# Patient Record
Sex: Female | Born: 1935 | Race: White | Hispanic: No | Smoking: Never smoker
Health system: Southern US, Community
[De-identification: ages and names within clinical notes are randomized; demographics above are authoritative.]

## PROBLEM LIST (undated history)

## (undated) DIAGNOSIS — I10 Essential (primary) hypertension: Secondary | ICD-10-CM

## (undated) DIAGNOSIS — K5909 Other constipation: Secondary | ICD-10-CM

## (undated) DIAGNOSIS — R112 Nausea with vomiting, unspecified: Secondary | ICD-10-CM

## (undated) DIAGNOSIS — M1990 Unspecified osteoarthritis, unspecified site: Secondary | ICD-10-CM

## (undated) DIAGNOSIS — Z0000 Encounter for general adult medical examination without abnormal findings: Secondary | ICD-10-CM

## (undated) DIAGNOSIS — R609 Edema, unspecified: Secondary | ICD-10-CM

## (undated) DIAGNOSIS — F32A Depression, unspecified: Secondary | ICD-10-CM

## (undated) DIAGNOSIS — F323 Major depressive disorder, single episode, severe with psychotic features: Secondary | ICD-10-CM

## (undated) DIAGNOSIS — Z9889 Other specified postprocedural states: Secondary | ICD-10-CM

## (undated) DIAGNOSIS — K269 Duodenal ulcer, unspecified as acute or chronic, without hemorrhage or perforation: Secondary | ICD-10-CM

## (undated) DIAGNOSIS — M5430 Sciatica, unspecified side: Secondary | ICD-10-CM

## (undated) DIAGNOSIS — I4891 Unspecified atrial fibrillation: Secondary | ICD-10-CM

## (undated) DIAGNOSIS — E039 Hypothyroidism, unspecified: Secondary | ICD-10-CM

## (undated) DIAGNOSIS — K8020 Calculus of gallbladder without cholecystitis without obstruction: Secondary | ICD-10-CM

## (undated) DIAGNOSIS — K222 Esophageal obstruction: Secondary | ICD-10-CM

## (undated) DIAGNOSIS — K5732 Diverticulitis of large intestine without perforation or abscess without bleeding: Secondary | ICD-10-CM

## (undated) DIAGNOSIS — R1310 Dysphagia, unspecified: Secondary | ICD-10-CM

## (undated) DIAGNOSIS — I509 Heart failure, unspecified: Secondary | ICD-10-CM

## (undated) DIAGNOSIS — E538 Deficiency of other specified B group vitamins: Secondary | ICD-10-CM

## (undated) HISTORY — DX: Hypothyroidism, unspecified: E03.9

## (undated) HISTORY — DX: Duodenal ulcer, unspecified as acute or chronic, without hemorrhage or perforation: K26.9

## (undated) HISTORY — DX: Edema, unspecified: R60.9

## (undated) HISTORY — DX: Sciatica, unspecified side: M54.30

## (undated) HISTORY — DX: Heart failure, unspecified: I50.9

## (undated) HISTORY — DX: Encounter for general adult medical examination without abnormal findings: Z00.00

## (undated) HISTORY — DX: Diverticulitis of large intestine without perforation or abscess without bleeding: K57.32

## (undated) HISTORY — DX: Dysphagia, unspecified: R13.10

## (undated) HISTORY — PX: ABDOMINAL HYSTERECTOMY: SHX81

## (undated) HISTORY — DX: Unspecified osteoarthritis, unspecified site: M19.90

## (undated) HISTORY — DX: Other constipation: K59.09

## (undated) HISTORY — DX: Depression, unspecified: F32.A

## (undated) HISTORY — DX: Deficiency of other specified B group vitamins: E53.8

## (undated) HISTORY — DX: Esophageal obstruction: K22.2

## (undated) HISTORY — DX: Major depressive disorder, single episode, severe with psychotic features: F32.3

## (undated) HISTORY — PX: CHOLECYSTECTOMY: SHX55

## (undated) HISTORY — DX: Essential (primary) hypertension: I10

## (undated) HISTORY — DX: Calculus of gallbladder without cholecystitis without obstruction: K80.20

## (undated) HISTORY — DX: Unspecified atrial fibrillation: I48.91

---

## 1998-09-19 ENCOUNTER — Encounter: Payer: Self-pay | Admitting: Urology

## 1998-09-21 ENCOUNTER — Ambulatory Visit (HOSPITAL_COMMUNITY): Admission: RE | Admit: 1998-09-21 | Discharge: 1998-09-21 | Payer: Self-pay | Admitting: Urology

## 1999-08-30 ENCOUNTER — Ambulatory Visit (HOSPITAL_COMMUNITY): Admission: RE | Admit: 1999-08-30 | Discharge: 1999-08-30 | Payer: Self-pay | Admitting: Urology

## 2000-02-25 ENCOUNTER — Encounter: Payer: Self-pay | Admitting: Internal Medicine

## 2000-02-25 ENCOUNTER — Encounter: Admission: RE | Admit: 2000-02-25 | Discharge: 2000-02-25 | Payer: Self-pay | Admitting: Internal Medicine

## 2000-05-20 ENCOUNTER — Encounter: Payer: Self-pay | Admitting: Endocrinology

## 2000-05-20 ENCOUNTER — Ambulatory Visit (HOSPITAL_COMMUNITY): Admission: RE | Admit: 2000-05-20 | Discharge: 2000-05-20 | Payer: Self-pay | Admitting: Endocrinology

## 2001-02-17 ENCOUNTER — Encounter: Admission: RE | Admit: 2001-02-17 | Discharge: 2001-02-17 | Payer: Self-pay | Admitting: Internal Medicine

## 2001-02-17 ENCOUNTER — Encounter: Payer: Self-pay | Admitting: Internal Medicine

## 2002-04-14 ENCOUNTER — Encounter: Admission: RE | Admit: 2002-04-14 | Discharge: 2002-04-14 | Payer: Self-pay | Admitting: Internal Medicine

## 2002-04-14 ENCOUNTER — Encounter: Payer: Self-pay | Admitting: Internal Medicine

## 2002-06-25 ENCOUNTER — Ambulatory Visit (HOSPITAL_COMMUNITY): Admission: RE | Admit: 2002-06-25 | Discharge: 2002-06-25 | Payer: Self-pay | Admitting: Gastroenterology

## 2002-06-25 ENCOUNTER — Encounter: Payer: Self-pay | Admitting: Gastroenterology

## 2003-09-01 ENCOUNTER — Encounter: Admission: RE | Admit: 2003-09-01 | Discharge: 2003-09-01 | Payer: Self-pay | Admitting: Internal Medicine

## 2004-05-16 ENCOUNTER — Ambulatory Visit: Payer: Self-pay | Admitting: Internal Medicine

## 2004-06-27 ENCOUNTER — Ambulatory Visit: Payer: Self-pay | Admitting: Internal Medicine

## 2004-07-04 ENCOUNTER — Ambulatory Visit: Payer: Self-pay | Admitting: Gastroenterology

## 2004-07-05 ENCOUNTER — Ambulatory Visit (HOSPITAL_COMMUNITY): Admission: RE | Admit: 2004-07-05 | Discharge: 2004-07-05 | Payer: Self-pay | Admitting: Gastroenterology

## 2004-07-10 ENCOUNTER — Ambulatory Visit: Payer: Self-pay | Admitting: Gastroenterology

## 2005-01-02 ENCOUNTER — Encounter: Admission: RE | Admit: 2005-01-02 | Discharge: 2005-01-02 | Payer: Self-pay | Admitting: Internal Medicine

## 2005-02-25 ENCOUNTER — Ambulatory Visit: Payer: Self-pay | Admitting: Internal Medicine

## 2005-04-16 ENCOUNTER — Ambulatory Visit: Payer: Self-pay | Admitting: Internal Medicine

## 2005-10-31 ENCOUNTER — Ambulatory Visit: Payer: Self-pay | Admitting: Internal Medicine

## 2005-12-06 ENCOUNTER — Ambulatory Visit: Payer: Self-pay | Admitting: Internal Medicine

## 2006-01-28 ENCOUNTER — Encounter: Admission: RE | Admit: 2006-01-28 | Discharge: 2006-01-28 | Payer: Self-pay | Admitting: Internal Medicine

## 2006-02-24 ENCOUNTER — Ambulatory Visit: Payer: Self-pay | Admitting: Internal Medicine

## 2006-07-07 ENCOUNTER — Ambulatory Visit: Payer: Self-pay | Admitting: Internal Medicine

## 2006-10-14 ENCOUNTER — Ambulatory Visit: Payer: Self-pay | Admitting: Internal Medicine

## 2006-10-14 ENCOUNTER — Inpatient Hospital Stay (HOSPITAL_COMMUNITY): Admission: EM | Admit: 2006-10-14 | Discharge: 2006-10-15 | Payer: Self-pay | Admitting: Emergency Medicine

## 2006-10-24 ENCOUNTER — Ambulatory Visit: Payer: Self-pay | Admitting: Internal Medicine

## 2006-10-24 LAB — CONVERTED CEMR LAB
CO2: 29 meq/L (ref 19–32)
Chloride: 105 meq/L (ref 96–112)
Creatinine, Ser: 1.7 mg/dL — ABNORMAL HIGH (ref 0.4–1.2)
Potassium: 4.8 meq/L (ref 3.5–5.1)
Sodium: 140 meq/L (ref 135–145)
Total CK: 62 units/L (ref 7–177)

## 2007-02-14 ENCOUNTER — Encounter: Payer: Self-pay | Admitting: *Deleted

## 2007-02-14 DIAGNOSIS — I10 Essential (primary) hypertension: Secondary | ICD-10-CM

## 2007-03-09 ENCOUNTER — Ambulatory Visit: Payer: Self-pay | Admitting: Internal Medicine

## 2007-03-10 ENCOUNTER — Encounter: Admission: RE | Admit: 2007-03-10 | Discharge: 2007-03-10 | Payer: Self-pay | Admitting: Internal Medicine

## 2007-04-22 ENCOUNTER — Telehealth: Payer: Self-pay | Admitting: Internal Medicine

## 2007-04-23 ENCOUNTER — Ambulatory Visit: Payer: Self-pay | Admitting: Internal Medicine

## 2007-06-01 ENCOUNTER — Telehealth: Payer: Self-pay | Admitting: Internal Medicine

## 2007-12-01 ENCOUNTER — Telehealth: Payer: Self-pay | Admitting: Internal Medicine

## 2008-01-15 ENCOUNTER — Ambulatory Visit: Payer: Self-pay | Admitting: Internal Medicine

## 2008-01-15 DIAGNOSIS — K5909 Other constipation: Secondary | ICD-10-CM | POA: Insufficient documentation

## 2008-01-20 ENCOUNTER — Telehealth: Payer: Self-pay | Admitting: Internal Medicine

## 2008-01-21 ENCOUNTER — Ambulatory Visit: Payer: Self-pay | Admitting: Internal Medicine

## 2008-01-24 ENCOUNTER — Encounter: Payer: Self-pay | Admitting: Internal Medicine

## 2008-01-25 ENCOUNTER — Telehealth: Payer: Self-pay | Admitting: Internal Medicine

## 2008-01-25 DIAGNOSIS — M5430 Sciatica, unspecified side: Secondary | ICD-10-CM

## 2008-01-26 ENCOUNTER — Telehealth: Payer: Self-pay | Admitting: Internal Medicine

## 2008-01-26 ENCOUNTER — Encounter (INDEPENDENT_AMBULATORY_CARE_PROVIDER_SITE_OTHER): Payer: Self-pay | Admitting: *Deleted

## 2008-01-28 ENCOUNTER — Telehealth: Payer: Self-pay | Admitting: Internal Medicine

## 2008-01-29 ENCOUNTER — Telehealth: Payer: Self-pay | Admitting: Internal Medicine

## 2008-02-01 ENCOUNTER — Inpatient Hospital Stay (HOSPITAL_COMMUNITY): Admission: AD | Admit: 2008-02-01 | Discharge: 2008-02-05 | Payer: Self-pay | Admitting: Internal Medicine

## 2008-02-01 ENCOUNTER — Telehealth: Payer: Self-pay | Admitting: Internal Medicine

## 2008-02-01 ENCOUNTER — Ambulatory Visit: Payer: Self-pay | Admitting: Internal Medicine

## 2008-02-08 ENCOUNTER — Ambulatory Visit: Payer: Self-pay | Admitting: Internal Medicine

## 2008-02-08 ENCOUNTER — Telehealth: Payer: Self-pay | Admitting: Internal Medicine

## 2008-02-08 DIAGNOSIS — F323 Major depressive disorder, single episode, severe with psychotic features: Secondary | ICD-10-CM

## 2008-02-10 ENCOUNTER — Telehealth: Payer: Self-pay | Admitting: Internal Medicine

## 2008-02-11 ENCOUNTER — Ambulatory Visit: Payer: Self-pay | Admitting: Internal Medicine

## 2008-02-11 ENCOUNTER — Inpatient Hospital Stay (HOSPITAL_COMMUNITY): Admission: EM | Admit: 2008-02-11 | Discharge: 2008-02-18 | Payer: Self-pay | Admitting: Emergency Medicine

## 2008-02-19 ENCOUNTER — Telehealth: Payer: Self-pay | Admitting: Internal Medicine

## 2008-02-23 ENCOUNTER — Telehealth: Payer: Self-pay | Admitting: Internal Medicine

## 2008-02-25 ENCOUNTER — Ambulatory Visit: Payer: Self-pay | Admitting: Internal Medicine

## 2008-02-25 LAB — CONVERTED CEMR LAB
BUN: 18 mg/dL (ref 6–23)
CO2: 31 meq/L (ref 19–32)
Chloride: 105 meq/L (ref 96–112)
Creatinine, Ser: 1.2 mg/dL (ref 0.4–1.2)
GFR calc non Af Amer: 45 mL/min
Potassium: 5 meq/L (ref 3.5–5.1)

## 2008-02-26 ENCOUNTER — Telehealth: Payer: Self-pay | Admitting: Internal Medicine

## 2008-02-26 ENCOUNTER — Encounter: Payer: Self-pay | Admitting: Internal Medicine

## 2008-03-02 ENCOUNTER — Telehealth (INDEPENDENT_AMBULATORY_CARE_PROVIDER_SITE_OTHER): Payer: Self-pay | Admitting: *Deleted

## 2008-03-08 ENCOUNTER — Inpatient Hospital Stay (HOSPITAL_COMMUNITY): Admission: RE | Admit: 2008-03-08 | Discharge: 2008-03-14 | Payer: Self-pay | Admitting: Internal Medicine

## 2008-03-08 ENCOUNTER — Ambulatory Visit: Payer: Self-pay | Admitting: Internal Medicine

## 2008-03-08 DIAGNOSIS — R1310 Dysphagia, unspecified: Secondary | ICD-10-CM | POA: Insufficient documentation

## 2008-03-11 ENCOUNTER — Ambulatory Visit: Payer: Self-pay | Admitting: Internal Medicine

## 2008-03-15 ENCOUNTER — Encounter: Payer: Self-pay | Admitting: Internal Medicine

## 2008-03-16 ENCOUNTER — Telehealth: Payer: Self-pay | Admitting: Internal Medicine

## 2008-03-16 ENCOUNTER — Telehealth (INDEPENDENT_AMBULATORY_CARE_PROVIDER_SITE_OTHER): Payer: Self-pay | Admitting: *Deleted

## 2008-03-17 ENCOUNTER — Telehealth: Payer: Self-pay | Admitting: Internal Medicine

## 2008-03-18 ENCOUNTER — Telehealth: Payer: Self-pay | Admitting: Internal Medicine

## 2008-03-19 ENCOUNTER — Inpatient Hospital Stay (HOSPITAL_COMMUNITY): Admission: EM | Admit: 2008-03-19 | Discharge: 2008-03-28 | Payer: Self-pay | Admitting: Emergency Medicine

## 2008-03-19 ENCOUNTER — Ambulatory Visit: Payer: Self-pay | Admitting: Internal Medicine

## 2008-03-21 ENCOUNTER — Telehealth: Payer: Self-pay | Admitting: Internal Medicine

## 2008-03-22 ENCOUNTER — Encounter: Payer: Self-pay | Admitting: Internal Medicine

## 2008-03-24 ENCOUNTER — Encounter: Payer: Self-pay | Admitting: Internal Medicine

## 2008-03-29 ENCOUNTER — Telehealth: Payer: Self-pay | Admitting: Internal Medicine

## 2008-03-30 ENCOUNTER — Telehealth: Payer: Self-pay | Admitting: Internal Medicine

## 2008-03-30 ENCOUNTER — Ambulatory Visit: Payer: Self-pay | Admitting: Internal Medicine

## 2008-03-30 DIAGNOSIS — I4891 Unspecified atrial fibrillation: Secondary | ICD-10-CM

## 2008-03-30 DIAGNOSIS — R609 Edema, unspecified: Secondary | ICD-10-CM | POA: Insufficient documentation

## 2008-03-31 ENCOUNTER — Telehealth: Payer: Self-pay | Admitting: Internal Medicine

## 2008-03-31 ENCOUNTER — Encounter: Payer: Self-pay | Admitting: Internal Medicine

## 2008-04-04 ENCOUNTER — Encounter: Payer: Self-pay | Admitting: Internal Medicine

## 2008-04-07 ENCOUNTER — Telehealth: Payer: Self-pay | Admitting: Internal Medicine

## 2008-04-08 ENCOUNTER — Telehealth: Payer: Self-pay | Admitting: Internal Medicine

## 2008-04-11 ENCOUNTER — Telehealth: Payer: Self-pay | Admitting: Internal Medicine

## 2008-04-14 ENCOUNTER — Ambulatory Visit: Payer: Self-pay | Admitting: Internal Medicine

## 2008-04-14 LAB — CONVERTED CEMR LAB
Blood in Urine, dipstick: NEGATIVE
Glucose, Urine, Semiquant: NEGATIVE
Ketones, urine, test strip: NEGATIVE
Nitrite: NEGATIVE
Protein, U semiquant: NEGATIVE
WBC Urine, dipstick: NEGATIVE

## 2008-04-19 ENCOUNTER — Encounter: Payer: Self-pay | Admitting: Internal Medicine

## 2008-04-22 ENCOUNTER — Telehealth: Payer: Self-pay | Admitting: Internal Medicine

## 2008-04-25 ENCOUNTER — Encounter: Payer: Self-pay | Admitting: Internal Medicine

## 2008-04-25 ENCOUNTER — Ambulatory Visit (HOSPITAL_COMMUNITY): Admission: RE | Admit: 2008-04-25 | Discharge: 2008-04-25 | Payer: Self-pay | Admitting: Internal Medicine

## 2008-04-25 ENCOUNTER — Ambulatory Visit: Admission: RE | Admit: 2008-04-25 | Discharge: 2008-04-25 | Payer: Self-pay | Admitting: Internal Medicine

## 2008-04-26 ENCOUNTER — Ambulatory Visit: Payer: Self-pay | Admitting: Internal Medicine

## 2008-04-27 ENCOUNTER — Telehealth: Payer: Self-pay | Admitting: Internal Medicine

## 2008-05-02 ENCOUNTER — Telehealth: Payer: Self-pay | Admitting: Internal Medicine

## 2008-05-05 ENCOUNTER — Telehealth: Payer: Self-pay | Admitting: Internal Medicine

## 2008-05-09 ENCOUNTER — Telehealth: Payer: Self-pay | Admitting: Internal Medicine

## 2008-05-09 ENCOUNTER — Ambulatory Visit: Payer: Self-pay | Admitting: Internal Medicine

## 2008-05-10 ENCOUNTER — Encounter: Payer: Self-pay | Admitting: Internal Medicine

## 2008-05-12 ENCOUNTER — Telehealth: Payer: Self-pay | Admitting: Internal Medicine

## 2008-05-13 ENCOUNTER — Telehealth: Payer: Self-pay | Admitting: Internal Medicine

## 2008-05-16 ENCOUNTER — Telehealth: Payer: Self-pay | Admitting: Internal Medicine

## 2008-05-23 ENCOUNTER — Telehealth: Payer: Self-pay | Admitting: Internal Medicine

## 2008-06-01 ENCOUNTER — Telehealth: Payer: Self-pay | Admitting: Internal Medicine

## 2008-06-10 DIAGNOSIS — K222 Esophageal obstruction: Secondary | ICD-10-CM | POA: Insufficient documentation

## 2008-06-10 DIAGNOSIS — K269 Duodenal ulcer, unspecified as acute or chronic, without hemorrhage or perforation: Secondary | ICD-10-CM | POA: Insufficient documentation

## 2008-06-13 ENCOUNTER — Ambulatory Visit: Payer: Self-pay | Admitting: Internal Medicine

## 2008-07-07 ENCOUNTER — Encounter: Payer: Self-pay | Admitting: Internal Medicine

## 2008-09-14 ENCOUNTER — Telehealth: Payer: Self-pay | Admitting: Internal Medicine

## 2008-10-04 ENCOUNTER — Telehealth: Payer: Self-pay | Admitting: Internal Medicine

## 2008-11-16 ENCOUNTER — Telehealth: Payer: Self-pay | Admitting: Internal Medicine

## 2008-11-23 ENCOUNTER — Telehealth: Payer: Self-pay | Admitting: Internal Medicine

## 2008-11-25 ENCOUNTER — Encounter: Payer: Self-pay | Admitting: Internal Medicine

## 2008-12-27 ENCOUNTER — Telehealth (INDEPENDENT_AMBULATORY_CARE_PROVIDER_SITE_OTHER): Payer: Self-pay | Admitting: *Deleted

## 2008-12-28 ENCOUNTER — Ambulatory Visit: Payer: Self-pay | Admitting: Internal Medicine

## 2009-01-06 ENCOUNTER — Telehealth: Payer: Self-pay | Admitting: Internal Medicine

## 2009-01-09 ENCOUNTER — Telehealth: Payer: Self-pay | Admitting: Internal Medicine

## 2009-01-23 ENCOUNTER — Telehealth: Payer: Self-pay | Admitting: Internal Medicine

## 2009-02-15 ENCOUNTER — Telehealth: Payer: Self-pay | Admitting: Internal Medicine

## 2009-02-16 ENCOUNTER — Ambulatory Visit: Payer: Self-pay | Admitting: Internal Medicine

## 2009-02-19 DIAGNOSIS — I509 Heart failure, unspecified: Secondary | ICD-10-CM | POA: Insufficient documentation

## 2009-02-22 ENCOUNTER — Telehealth: Payer: Self-pay | Admitting: Internal Medicine

## 2009-03-07 ENCOUNTER — Telehealth: Payer: Self-pay | Admitting: Internal Medicine

## 2009-03-15 ENCOUNTER — Ambulatory Visit: Payer: Self-pay | Admitting: Internal Medicine

## 2009-03-17 ENCOUNTER — Telehealth: Payer: Self-pay | Admitting: Internal Medicine

## 2009-03-23 ENCOUNTER — Telehealth: Payer: Self-pay | Admitting: Internal Medicine

## 2009-04-06 ENCOUNTER — Ambulatory Visit: Payer: Self-pay | Admitting: Internal Medicine

## 2009-04-10 ENCOUNTER — Telehealth: Payer: Self-pay | Admitting: Internal Medicine

## 2009-04-14 ENCOUNTER — Ambulatory Visit: Payer: Self-pay | Admitting: Internal Medicine

## 2009-04-14 ENCOUNTER — Inpatient Hospital Stay (HOSPITAL_COMMUNITY): Admission: EM | Admit: 2009-04-14 | Discharge: 2009-04-17 | Payer: Self-pay | Admitting: Emergency Medicine

## 2009-04-14 LAB — CONVERTED CEMR LAB
Specific Gravity, Urine: 1.015 (ref 1.000–1.030)
Urobilinogen, UA: 0.2 (ref 0.0–1.0)
pH: 5.5 (ref 5.0–8.0)

## 2009-04-17 ENCOUNTER — Encounter (INDEPENDENT_AMBULATORY_CARE_PROVIDER_SITE_OTHER): Payer: Self-pay | Admitting: Internal Medicine

## 2009-04-18 ENCOUNTER — Telehealth: Payer: Self-pay | Admitting: Internal Medicine

## 2009-04-19 ENCOUNTER — Telehealth: Payer: Self-pay | Admitting: Internal Medicine

## 2009-04-27 ENCOUNTER — Ambulatory Visit: Payer: Self-pay | Admitting: Internal Medicine

## 2009-05-03 ENCOUNTER — Telehealth: Payer: Self-pay | Admitting: Internal Medicine

## 2009-05-08 ENCOUNTER — Telehealth: Payer: Self-pay | Admitting: Internal Medicine

## 2009-05-12 ENCOUNTER — Telehealth: Payer: Self-pay | Admitting: Internal Medicine

## 2009-05-30 ENCOUNTER — Telehealth: Payer: Self-pay | Admitting: Internal Medicine

## 2009-06-09 ENCOUNTER — Telehealth: Payer: Self-pay | Admitting: Internal Medicine

## 2009-06-09 ENCOUNTER — Telehealth (INDEPENDENT_AMBULATORY_CARE_PROVIDER_SITE_OTHER): Payer: Self-pay | Admitting: *Deleted

## 2009-07-03 ENCOUNTER — Ambulatory Visit: Payer: Self-pay | Admitting: Internal Medicine

## 2009-07-03 LAB — CONVERTED CEMR LAB
BUN: 20 mg/dL (ref 6–23)
Calcium: 9.2 mg/dL (ref 8.4–10.5)
Creatinine, Ser: 1.8 mg/dL — ABNORMAL HIGH (ref 0.4–1.2)
GFR calc non Af Amer: 28.03 mL/min (ref >60)
TSH: 2.82 microintl units/mL (ref 0.35–5.50)

## 2009-07-09 ENCOUNTER — Telehealth (INDEPENDENT_AMBULATORY_CARE_PROVIDER_SITE_OTHER): Payer: Self-pay | Admitting: *Deleted

## 2009-07-10 ENCOUNTER — Telehealth: Payer: Self-pay | Admitting: Internal Medicine

## 2009-07-31 ENCOUNTER — Ambulatory Visit: Payer: Self-pay | Admitting: Internal Medicine

## 2009-09-14 ENCOUNTER — Telehealth: Payer: Self-pay | Admitting: Internal Medicine

## 2009-09-27 ENCOUNTER — Telehealth (INDEPENDENT_AMBULATORY_CARE_PROVIDER_SITE_OTHER): Payer: Self-pay | Admitting: *Deleted

## 2009-10-11 ENCOUNTER — Telehealth: Payer: Self-pay | Admitting: Internal Medicine

## 2009-10-16 ENCOUNTER — Ambulatory Visit: Payer: Self-pay | Admitting: Internal Medicine

## 2009-10-16 DIAGNOSIS — K5732 Diverticulitis of large intestine without perforation or abscess without bleeding: Secondary | ICD-10-CM

## 2009-10-16 LAB — CONVERTED CEMR LAB
AST: 30 units/L (ref 0–37)
Alkaline Phosphatase: 123 units/L — ABNORMAL HIGH (ref 39–117)
Basophils Relative: 0.7 % (ref 0.0–3.0)
Bilirubin, Direct: 0.1 mg/dL (ref 0.0–0.3)
Calcium: 9 mg/dL (ref 8.4–10.5)
Creatinine, Ser: 2 mg/dL — ABNORMAL HIGH (ref 0.4–1.2)
Eosinophils Absolute: 0.1 10*3/uL (ref 0.0–0.7)
GFR calc non Af Amer: 24.81 mL/min (ref >60)
Lymphocytes Relative: 17.9 % (ref 12.0–46.0)
MCHC: 34.4 g/dL (ref 30.0–36.0)
Monocytes Relative: 8.4 % (ref 3.0–12.0)
Neutrophils Relative %: 71.8 % (ref 43.0–77.0)
RBC: 3.81 M/uL — ABNORMAL LOW (ref 3.87–5.11)
Sodium: 142 meq/L (ref 135–145)
Total Protein: 7 g/dL (ref 6.0–8.3)
WBC: 7.2 10*3/uL (ref 4.5–10.5)

## 2009-10-17 ENCOUNTER — Ambulatory Visit: Payer: Self-pay | Admitting: Cardiovascular Disease

## 2009-10-19 ENCOUNTER — Telehealth: Payer: Self-pay | Admitting: Internal Medicine

## 2009-10-30 ENCOUNTER — Telehealth: Payer: Self-pay | Admitting: Internal Medicine

## 2009-11-09 ENCOUNTER — Telehealth: Payer: Self-pay | Admitting: Internal Medicine

## 2009-11-28 ENCOUNTER — Telehealth: Payer: Self-pay | Admitting: Internal Medicine

## 2009-12-05 ENCOUNTER — Telehealth: Payer: Self-pay | Admitting: Internal Medicine

## 2009-12-08 ENCOUNTER — Telehealth: Payer: Self-pay | Admitting: Internal Medicine

## 2010-01-10 IMAGING — CR DG CHEST 2V
1 series · 1 of 1 positions shown · non-contrast
Comparison: 02/11/2008 and 02/02/2008

CLINICAL DATA: Failure to thrive.  Vomiting.  Dysphasia.

CHEST - 2 VIEW

[view not recorded]
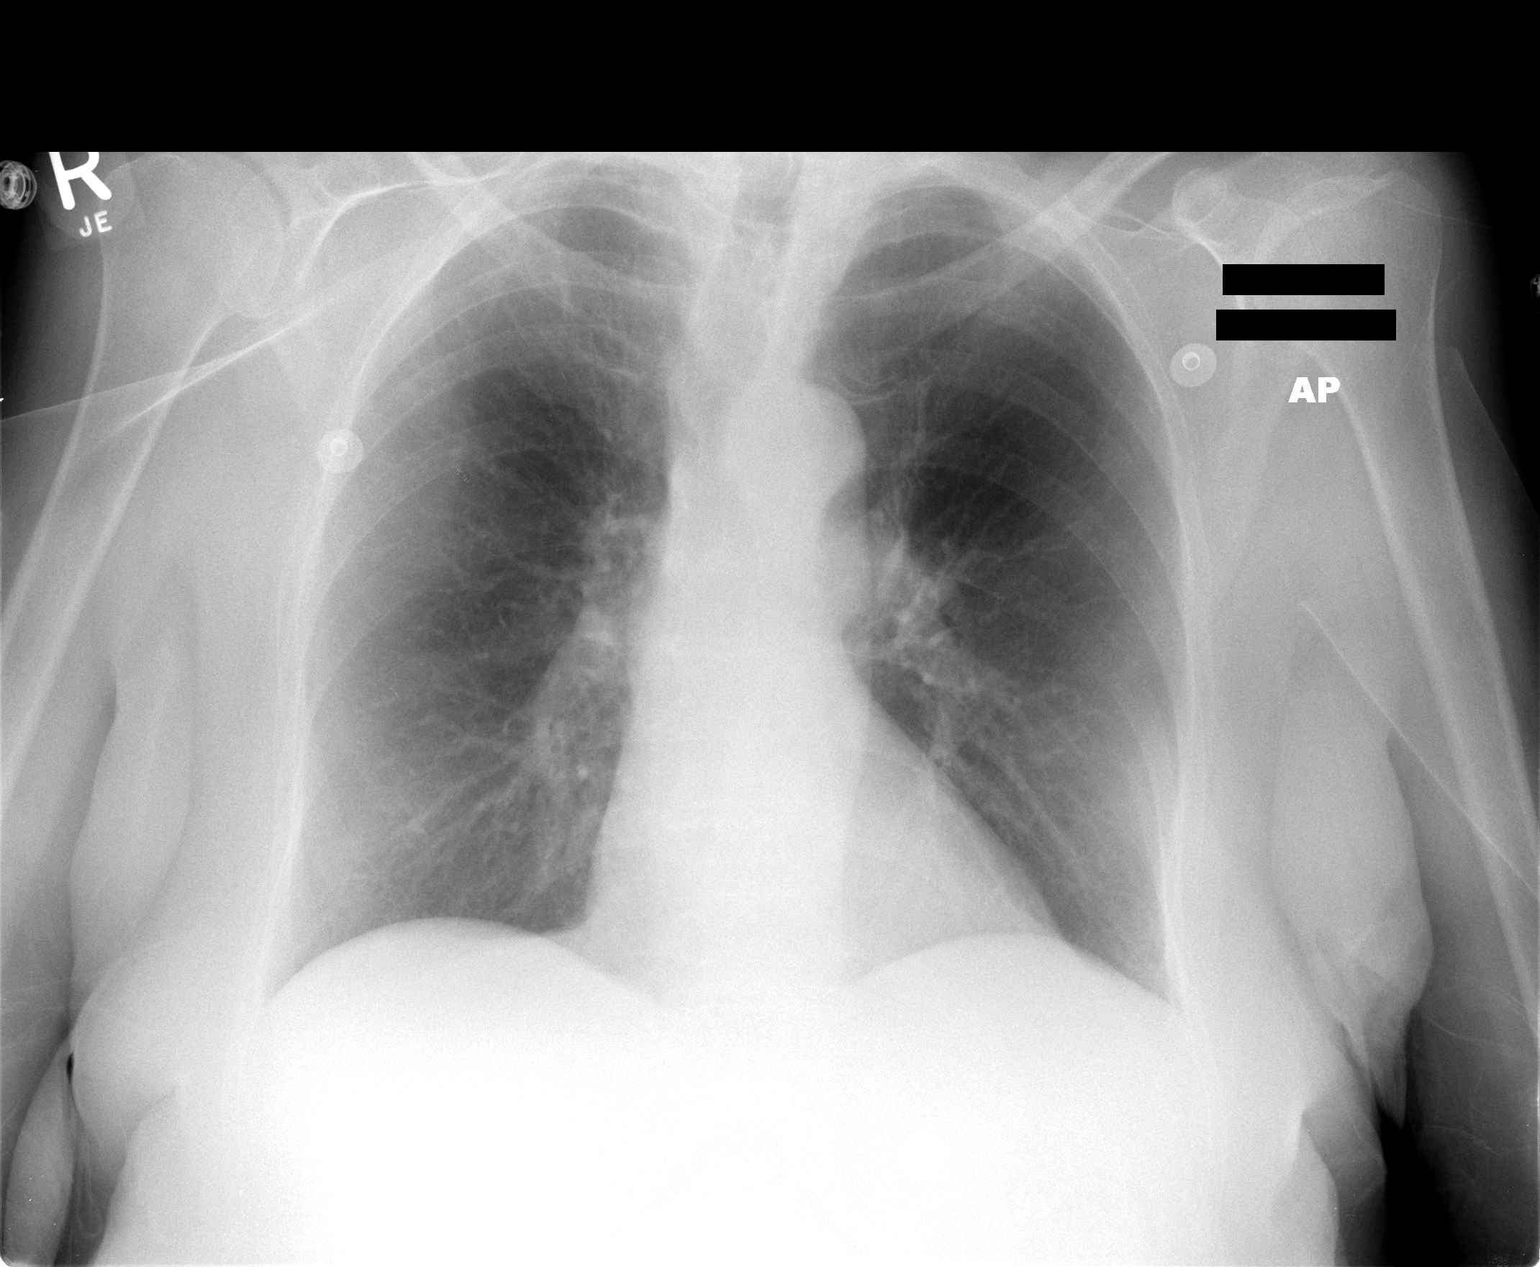

[1 of 1 positions shown; findings below may reference images not displayed]

FINDINGS: The heart size and mediastinal contours are within normal
limits.  Both lungs are clear.  The visualized skeletal structures
are unremarkable.
IMPRESSION: No active cardiopulmonary disease.

## 2010-01-11 IMAGING — RF DG FLUORO RM 1-60 MIN
2 series · 2 of 2 positions shown · non-contrast
Comparison: none

CLINICAL DATA: Dysphasia

[Series 1: run · 1 of 1 slices shown (1 of 2)]
[im 1/1]
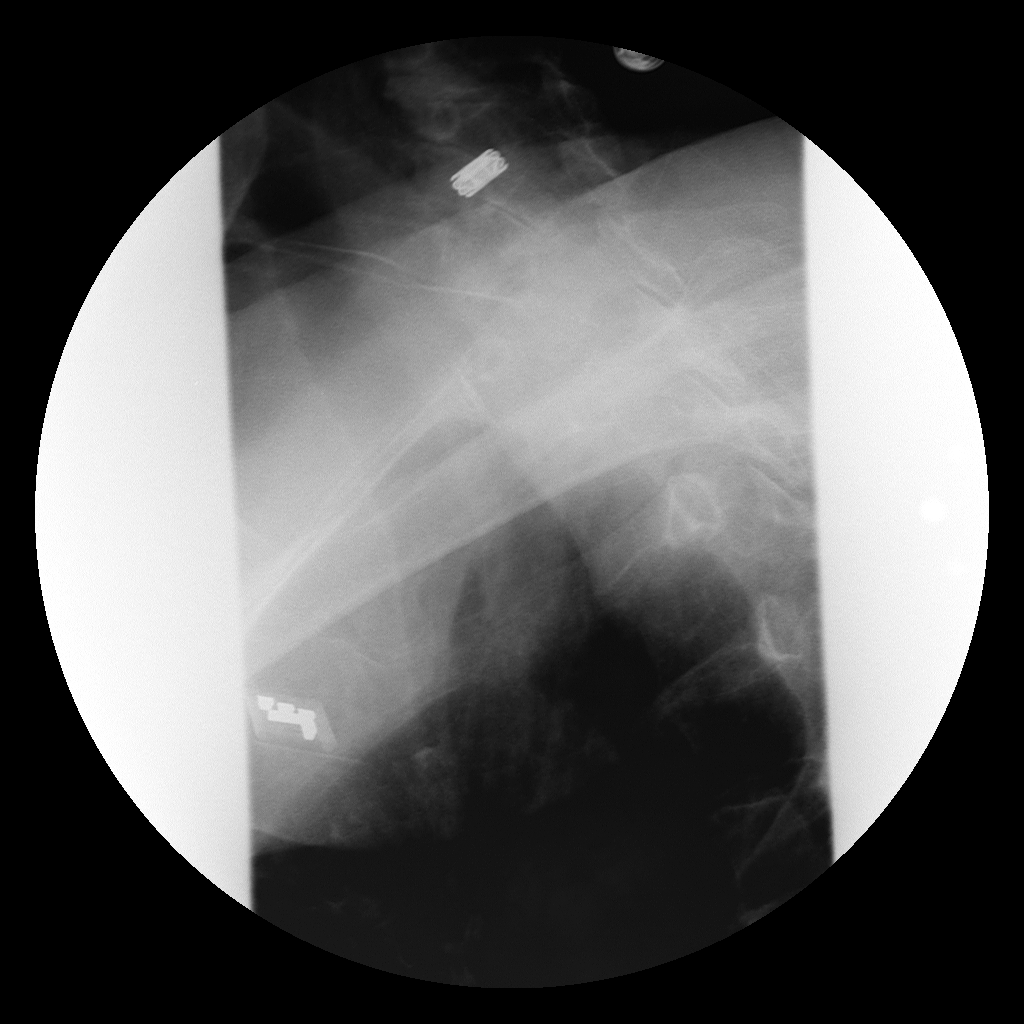

[Series 2: run · 1 of 1 slices shown (2 of 2)]
[im 1/1]
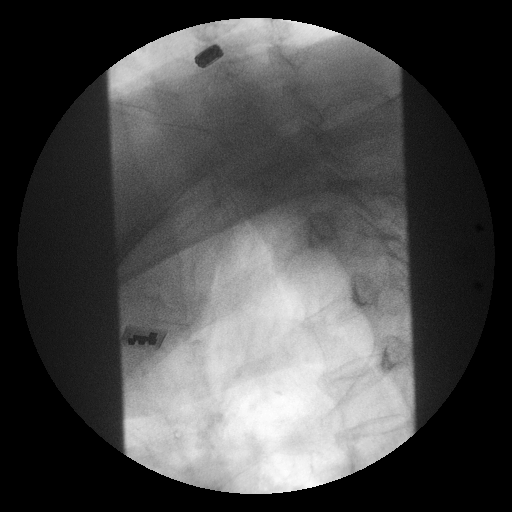

[2 of 2 positions shown; findings below may reference images not displayed]

FINDINGS: This was an attempted barium swallow.  Note that the
patient could not swallow barium with a straw or after injecting a
bolus of barium into the her mouth. The barium swallow was
therefore unsuccessful.

## 2010-01-31 ENCOUNTER — Telehealth: Payer: Self-pay | Admitting: Internal Medicine

## 2010-02-08 ENCOUNTER — Telehealth: Payer: Self-pay | Admitting: Internal Medicine

## 2010-02-09 ENCOUNTER — Telehealth: Payer: Self-pay | Admitting: Internal Medicine

## 2010-02-16 ENCOUNTER — Telehealth: Payer: Self-pay | Admitting: Internal Medicine

## 2010-02-19 ENCOUNTER — Telehealth: Payer: Self-pay | Admitting: Internal Medicine

## 2010-02-22 ENCOUNTER — Telehealth: Payer: Self-pay | Admitting: Internal Medicine

## 2010-02-23 ENCOUNTER — Ambulatory Visit: Payer: Self-pay | Admitting: Internal Medicine

## 2010-02-28 ENCOUNTER — Telehealth (INDEPENDENT_AMBULATORY_CARE_PROVIDER_SITE_OTHER): Payer: Self-pay | Admitting: *Deleted

## 2010-03-06 ENCOUNTER — Ambulatory Visit (HOSPITAL_COMMUNITY): Admission: RE | Admit: 2010-03-06 | Discharge: 2010-03-06 | Payer: Self-pay | Admitting: Internal Medicine

## 2010-03-09 ENCOUNTER — Encounter: Payer: Self-pay | Admitting: Internal Medicine

## 2010-03-09 ENCOUNTER — Telehealth: Payer: Self-pay | Admitting: Internal Medicine

## 2010-03-16 ENCOUNTER — Telehealth: Payer: Self-pay | Admitting: Internal Medicine

## 2010-03-20 ENCOUNTER — Encounter: Payer: Self-pay | Admitting: Internal Medicine

## 2010-03-23 ENCOUNTER — Telehealth: Payer: Self-pay | Admitting: Internal Medicine

## 2010-04-04 ENCOUNTER — Telehealth: Payer: Self-pay | Admitting: Internal Medicine

## 2010-04-16 ENCOUNTER — Telehealth: Payer: Self-pay | Admitting: Internal Medicine

## 2010-04-18 ENCOUNTER — Encounter: Payer: Self-pay | Admitting: Internal Medicine

## 2010-04-30 ENCOUNTER — Telehealth: Payer: Self-pay | Admitting: Internal Medicine

## 2010-06-12 ENCOUNTER — Telehealth: Payer: Self-pay | Admitting: Internal Medicine

## 2010-06-15 ENCOUNTER — Telehealth: Payer: Self-pay | Admitting: Internal Medicine

## 2010-07-13 ENCOUNTER — Ambulatory Visit: Admit: 2010-07-13 | Payer: Self-pay | Admitting: Internal Medicine

## 2010-07-31 NOTE — Progress Notes (Signed)
Summary: refill  Phone Note Refill Request   Refills Requested: Medication #1:  OXYCODONE-ACETAMINOPHEN 5-325 MG TABS 1 by mouth q6 for pain. Watch for drowsiness and constipation Initial call taken by: Alysia Penna,  April 04, 2010 5:20 PM Caller: Daughter Steward Drone Noonan  Follow-up for Phone Call        ok for refill 30 days supply x 3 scripts Follow-up by: Jacques Navy MD,  April 04, 2010 5:24 PM  Additional Follow-up for Phone Call Additional follow up Details #1::        Spoke with pt's daughter Steward Drone and informed her prescriptions are ready. They have been put up front for pick up Additional Follow-up by: Ami Bullins CMA,  April 05, 2010 2:34 PM    New/Updated Medications: OXYCODONE-ACETAMINOPHEN 5-325 MG TABS (OXYCODONE-ACETAMINOPHEN) 1 by mouth q6 for pain. Watch for drowsiness and constipation Fill on or after 04/05/10 OXYCODONE-ACETAMINOPHEN 5-325 MG TABS (OXYCODONE-ACETAMINOPHEN) 1 by mouth q6 for pain. Watch for drowsiness and constipation Fill on or after 05/06/10 OXYCODONE-ACETAMINOPHEN 5-325 MG TABS (OXYCODONE-ACETAMINOPHEN) 1 by mouth q6 for pain. Watch for drowsiness and constipation Fill on or after 06/05/10 Prescriptions: OXYCODONE-ACETAMINOPHEN 5-325 MG TABS (OXYCODONE-ACETAMINOPHEN) 1 by mouth q6 for pain. Watch for drowsiness and constipation Fill on or after 06/05/10  #60 x 0   Entered by:   Rock Nephew CMA   Authorized by:   Jacques Navy MD   Signed by:   Rock Nephew CMA on 04/05/2010   Method used:   Print then Give to Patient   RxID:   [id number] OXYCODONE-ACETAMINOPHEN 5-325 MG TABS (OXYCODONE-ACETAMINOPHEN) 1 by mouth q6 for pain. Watch for drowsiness and constipation Fill on or after 05/06/10  #60 x 0   Entered by:   Rock Nephew CMA   Authorized by:   Jacques Navy MD   Signed by:   Rock Nephew CMA on 04/05/2010   Method used:   Print then Give to Patient   RxID:   [id number] OXYCODONE-ACETAMINOPHEN 5-325 MG TABS  (OXYCODONE-ACETAMINOPHEN) 1 by mouth q6 for pain. Watch for drowsiness and constipation Fill on or after 04/05/10  #60 x 0   Entered by:   Rock Nephew CMA   Authorized by:   Jacques Navy MD   Signed by:   Rock Nephew CMA on 04/05/2010   Method used:   Print then Give to Patient   RxID:   [id number]

## 2010-07-31 NOTE — Progress Notes (Signed)
Summary: Gabapentin question  Phone Note Call from Patient Call back at Home Phone [phone]   Caller: Daughter-Brenda Summary of Call: Patient daughter is requesting a call back , has question regarding medication dosage. Initial call taken by: Rock Nephew CMA,  February 19, 2010 3:27 PM  Follow-up for Phone Call        Steward Drone called stating that the patient did start the new dosage and it makes her sleepy. She is aware that this is a side effect of the med and will call with update.   Dosing was throughly discussed with her during the call. Follow-up by: Lucious Groves CMA,  February 19, 2010 4:50 PM

## 2010-07-31 NOTE — Progress Notes (Signed)
  Phone Note Outgoing Call   Reason for Call: Discuss lab or test results Summary of Call: please call pt and dtr: results show mildly elevated blood sugar - watch the sweets and starches; mild elevation in creatinine that may reflect decreased fluid intake. It is tricky to keep up the right amount of fluid intake but avoid fluid overload. Be sure she is getting 2-3 glasses of fluid daily.  THANKS Initial call taken by: Jacques Navy MD,  July 09, 2009 4:47 PM  Follow-up for Phone Call        lmoam for pt to return call Follow-up by: Ami Bullins CMA,  July 10, 2009 10:26 AM  Additional Follow-up for Phone Call Additional follow up Details #1::        spoke with [name] [name] (pt's daughter). and advised her of Dr Debby Bud Advisement. Additional Follow-up by: Ami Bullins CMA,  July 10, 2009 11:02 AM

## 2010-07-31 NOTE — Progress Notes (Signed)
Summary: rx request  Phone Note Refill Request Message from:  Pharmacy on February 22, 2009 2:41 PM  Refills Requested: Medication #1:  TRIMETHOPRIM 100 MG TABS 1 once daily   Notes: rx written 09-26-2008 #30x4 Pharmacy requesting refill. Last Office Visit w/ Dr. Debby Bud: 02-16-2009. Okay for refill?  [facility] [facility] [phone]  Next Appointment Scheduled: No upcomming Initial call taken by: Beola Cord, CMA,  February 22, 2009 2:43 PM  Follow-up for Phone Call        ok to refill prn Follow-up by: Jacques Navy MD,  February 22, 2009 2:58 PM    Prescriptions: TRIMETHOPRIM 100 MG TABS (TRIMETHOPRIM) 1 once daily  #30 Tablet x 4   Entered by:   Orlan Leavens   Authorized by:   Jacques Navy MD   Signed by:   Orlan Leavens on 02/22/2009   Method used:   [facility]        [facility] (retail)       [address]       [place], [place]  [address]       Ph: [phone] or [phone]       Fax: [phone]   RxID:   [id number]

## 2010-07-31 NOTE — Letter (Signed)
Summary: *Referral Letter  [facility]  [address] [place], [place] [address]   Phone: [phone]  Fax: [phone]    03/09/2010  Thank you in advance for agreeing to see my patient:  [name] [name] [address] [place], [place]  [address]  Phone: [phone]  Reason for Referral: increased sciatica type pain left lower extremity. Complex MRI report does indicate significant foraminal stenosis at Left L4-5. Although not a candidate for major surgery I am asking if she would benefit from limited mini-invasive procedure to relieve compression at this level.   Procedures Requested:   Current Medical Problems: 1)  DIVERTICULITIS OF COLON (ICD-562.11) 2)  CHF, MILD (ICD-428.0) 3)  BRONCHITIS (ICD-490) 4)  FEEDING DIFFICULTIES AND MISMANAGEMENT (ICD-783.3) 5)  ESOPHAGEAL STRICTURE (ICD-530.3) 6)  DUODENAL ULCER (ICD-532.90) 7)  SHORTNESS OF BREATH (ICD-786.05) 8)  CANDIDIASIS (ICD-112.9) 9)  FIBRILLATION, ATRIAL (ICD-427.31) 10)  PERIPHERAL EDEMA (ICD-782.3) 11)  DYSPHAGIA UNSPECIFIED (ICD-787.20) 12)  HYPOKALEMIA (ICD-276.8) 13)  DEPRESSIVE TYPE PSYCHOSIS (ICD-298.0) 14)  UTI (ICD-599.0) 15)  SCIATICA (ICD-724.3) 16)  CONSTIPATION, CHRONIC (ICD-564.09) 17)  LEG PAIN, BILATERAL (ICD-729.5) 18)  HYPERTENSION (ICD-401.9) 19)  ANXIETY (ICD-300.00)   Current Medications: 1)  CYANOCOBALAMIN 1000 MCG/ML SOLN (CYANOCOBALAMIN) Inject once monthly 2)  PRILOSEC 20 MG  CPDR (OMEPRAZOLE) 2 capsules qam 3)  CLONIDINE HCL 0.1 MG  TABS (CLONIDINE HCL) 1 two times a day 4)  REMERON 30 MG TABS (MIRTAZAPINE) Take 1 tab by mouth at bedtime 5)  ATIVAN 0.5 MG TABS (LORAZEPAM) 1 by mouth AM, 2 PM 6)  DILTIAZEM HCL 120 MG TABS (DILTIAZEM HCL) 1 once daily 7)  TRAZODONE HCL 50 MG TABS (TRAZODONE HCL) 1/2 tablet at bedtime 8)  TRIMETHOPRIM 100 MG TABS (TRIMETHOPRIM) 1 once daily 9)  FUROSEMIDE 40 MG TABS (FUROSEMIDE) 1 by mouth once daily 10)   OXYCODONE-ACETAMINOPHEN 5-325 MG TABS (OXYCODONE-ACETAMINOPHEN) 1 by mouth q6 for pain. Watch for drowsiness and constipation 11)  KLOR-CON M20 20 MEQ CR-TABS (POTASSIUM CHLORIDE CRYS CR) 1 tablet by mouth once daily 12)  CORICIDIN D COLD/FLU/SINUS 2-5-325 MG TABS (CHLORPHEN-PHENYLEPH-APAP) as needed 13)  PROAIR HFA 108 (90 BASE) MCG/ACT AERS (ALBUTEROL SULFATE) 2 puffs q 4 hrs as needed 14)  AEROCHAMBER W/FLOWSIGNAL  MISC (SPACER/AERO-HOLDING CHAMBERS) use with metered dose inhaler 15)  SYNTHROID 25 MCG TABS (LEVOTHYROXINE SODIUM) 1 tab once daily 16)  ANUSOL-HC 25 MG SUPP (HYDROCORTISONE ACETATE) 1 per rectum two times a day x 3 days 17)  DIFLUCAN 150 MG TABS (FLUCONAZOLE) take one dose now 18)  GABAPENTIN 100 MG CAPS (GABAPENTIN) 1 at bedtime   Past Medical History: 1)  CANDIDIASIS (ICD-112.9) 2)  FIBRILLATION, ATRIAL (ICD-427.31) 3)  PERIPHERAL EDEMA (ICD-782.3) 4)  DYSPHAGIA UNSPECIFIED (ICD-787.20) 5)  HYPOKALEMIA (ICD-276.8) 6)  DEPRESSIVE TYPE PSYCHOSIS (ICD-298.0) 7)  UTI (ICD-599.0) 8)  SCIATICA (ICD-724.3) 9)  CONSTIPATION, CHRONIC (ICD-564.09) 10)  LEG PAIN, BILATERAL (ICD-729.5) 11)  HYPERTENSION (ICD-401.9) 12)  ANXIETY (ICD-300.00) 13)  Arthritis 14)  Gallstones 15)  GERD 16)  Hypertension 17)  Urinary Tract Infection   Prior History of Blood Transfusions:   Pertinent Labs:    Thank you again for agreeing to see our patient; please contact us if you have any further questions or need additional information.  Sincerely,  Jacques Navy MD

## 2010-07-31 NOTE — Progress Notes (Signed)
Summary: Gabapentin  Phone Note Call from Patient Call back at Home Phone [phone]   Caller: Daughter--Brenda Details for [facility] Summary of Call: Patient daughter called stating the patient is finishing the med she was given for pain in her leg. Patient is taking/finishing the Gabapentin 100mg  today and is inquiring if the patient should take more of this med and at what dosing schedule. Please advise. Initial call taken by: Lucious Groves CMA,  February 22, 2010 1:16 PM  Follow-up for Phone Call        see phone note 8/19: a increasing taper of gabapentin. Did she get up to 300mg  (3x100mg ) three times a day? Did it help her pain? answer about continuing and at what dose to be based on answers. Follow-up by: Jacques Navy MD,  February 22, 2010 1:35 PM  Additional Follow-up for Phone Call Additional follow up Details #1::        Pt is currently taking 300mg  three times a day. She is getting no relief from pain. She continues to do physical therapy exercises daily. At times daughter feels that she does too many.  1. What dose should pt increase to next? Pt needs new rx. 2. Should pt continue to do therapy exercises?  3. Would heat or ice help?  Additional Follow-up by: Lamar Sprinkles, CMA,  February 22, 2010 3:00 PM    Additional Follow-up for Phone Call Additional follow up Details #2::    1) resume 100mg  gabapentin at bedtime for leg cramps, # 30 , refill as needed 2) needs ov to reevaluate her for the cause of her pain Follow-up by: Jacques Navy MD,  February 22, 2010 4:40 PM  Additional Follow-up for Phone Call Additional follow up Details #3:: Details for Additional Follow-up Action Taken: Pt's daughter informed, scheduled for eval tomorrow pm. Additional Follow-up by: Lamar Sprinkles, CMA,  February 22, 2010 5:37 PM  New/Updated Medications: GABAPENTIN 100 MG CAPS (GABAPENTIN) 1 at bedtime

## 2010-07-31 NOTE — Progress Notes (Signed)
  Phone Note Refill Request Message from:  Fax from Pharmacy on Nov 28, 2009 8:38 AM  Refills Requested: Medication #1:  REMERON 30 MG TABS Take 1 tab by mouth at bedtime Please Advise refill  Initial call taken by: Ami Bullins CMA,  Nov 28, 2009 8:38 AM  Follow-up for Phone Call        see previous phone note Follow-up by: Jacques Navy MD,  Nov 28, 2009 10:28 AM

## 2010-07-31 NOTE — Progress Notes (Signed)
  Phone Note Refill Request Message from:  Fax from Pharmacy on Nov 09, 2009 10:19 AM  Refills Requested: Medication #1:  KLOR-CON M20 20 MEQ CR-TABS 1 tablet by mouth once daily Initial call taken by: Ami Bullins CMA,  Nov 09, 2009 10:19 AM    Prescriptions: KLOR-CON M20 20 MEQ CR-TABS (POTASSIUM CHLORIDE CRYS CR) 1 tablet by mouth once daily  #30 Tablet x 6   Entered by:   Ami Bullins CMA   Authorized by:   Jacques Navy MD   Signed by:   Bill Salinas CMA on 11/09/2009   Method used:   [facility]        [facility]  [place] (retail)       [address]       [place], [place]  [address]       Ph: [phone] or [phone]       Fax: [phone]   RxID:   [id number]

## 2010-07-31 NOTE — Progress Notes (Signed)
  Phone Note Other Incoming   Caller: Lucile Shutters- Pt's daughter Summary of Call: Pt daughter called to see if her mother could get another prescription for Diflucan 150mg  #1 for yeast infection. This is what pt was given the first of may. can see have another dose. Please Advise Initial call taken by: Ami Bullins CMA,  December 05, 2009 11:00 AM  Follow-up for Phone Call        sure Follow-up by: Etta Grandchild MD,  December 05, 2009 11:15 AM    Prescriptions: DIFLUCAN 150 MG TABS (FLUCONAZOLE) take one dose now  #1 x 1   Entered by:   Ami Bullins CMA   Authorized by:   Etta Grandchild MD   Signed by:   Bill Salinas CMA on 12/05/2009   Method used:   Electronically [facility]        [facility] (retail)       [address]       [place], [place]  [address]       Ph: [phone] or [phone]       Fax: [phone]   RxID:   [id number]

## 2010-07-31 NOTE — Progress Notes (Signed)
Summary: RESULTS  Phone Note Call from Patient   Caller: [phone] Steward Drone Summary of Call: Daughter is req a call back w/results of MRI.  Initial call taken by: Lamar Sprinkles, CMA,  March 09, 2010 11:43 AM  Follow-up for Phone Call        called and spoke with daughter. There is significant foraminal stenosis at L4-5 left which may be the origin of her back pain. Of note she is doing better with oxycodone.   Plan - refer to Dr. Cindra Presume letter done. Follow-up by: Jacques Navy MD,  March 09, 2010 5:04 PM

## 2010-07-31 NOTE — Progress Notes (Signed)
Summary: Sciatic pain   Phone Note Call from Patient Call back at Home Phone [phone]   Caller: BRENDA Summary of Call: Pt's daughter called. Pt is c/o increased pain with sciatic nerve. She takes hydrocodone 1 q6hrs for pain. Is there anything else pt can take for this pain? Per daughter, patient can not take prednisone.  Initial call taken by: Lamar Sprinkles, CMA,  February 08, 2010 2:47 PM  Follow-up for Phone Call        for nerve pain, e.g. sciatica, can try gabapentin: 100mg  once daily x3, 100mg  two times a day x 3, 100mg  three times a day x 3. If still without relief may increase to 200mg  three times a day x 3 and if no relief may increase to 300mg  three times a day. We do a slow taper to reduce the side affects of drowsiness.  May Rx for 50 tabs to start with. If we get to higher dose will change to higher dose tablets.  Follow-up by: Jacques Navy MD,  February 08, 2010 4:38 PM  Additional Follow-up for Phone Call Additional follow up Details #1::        Informed Brenda's son.  Additional Follow-up by: Lamar Sprinkles, CMA,  February 08, 2010 5:29 PM    New/Updated Medications: GABAPENTIN 100 MG CAPS (GABAPENTIN) 1 once daily x 3 days, then 1 two times a day x 3 days, then 1 three times a day x 3 days. For further increase contact MD Prescriptions: GABAPENTIN 100 MG CAPS (GABAPENTIN) 1 once daily x 3 days, then 1 two times a day x 3 days, then 1 three times a day x 3 days. For further increase contact MD  #50 x 0   Entered by:   Lamar Sprinkles, CMA   Authorized by:   Jacques Navy MD   Signed by:   Lamar Sprinkles, CMA on 02/08/2010   Method used:   Electron[facility]        [facility]  [place] (retail)       [address]       [place], [place]  [address]       Ph: [phone] or [phone]       Fax: [phone]   RxID:   [id number]

## 2010-07-31 NOTE — Progress Notes (Signed)
Summary: Yeast inf  Phone Note Call from Patient   Caller: Daughter Steward Drone Summary of Call: pt has yeast infection.  She is requesting rx be sent to pharmacy. Please advise Initial call taken by: Lanier Prude, [facility]),  April 16, 2010 11:46 AM  Follow-up for Phone Call        fluconazole 150mg  x 1 dose for yeast vaginitis Follow-up by: Jacques Navy MD,  April 16, 2010 12:53 PM  Additional Follow-up for Phone Call Additional follow up Details #1::        Rf sent Additional Follow-up by: Lanier Prude, [facility]),  April 16, 2010 2:42 PM    Prescriptions: DIFLUCAN 150 MG TABS (FLUCONAZOLE) take one dose now  #1 x 1   Entered by:   Lanier Prude, [facility])   Authorized by:   Jacques Navy MD   Signed by:   Lanier Prude, CMA(AAMA) on 04/16/2010   Method used:   Electronically to        [facility]  [place] (retail)       [address]       [place], [place]  [address]       Ph: [phone] or [phone]       Fax: [phone]   RxID:   [id number]

## 2010-07-31 NOTE — Progress Notes (Signed)
Summary: Refill--Lorazepam  Phone Note Refill Request Message from:  Fax from Pharmacy on December 08, 2009 4:34 PM  Refills Requested: Medication #1:  ATIVAN 0.5 MG TABS 1 by mouth AM Next Appointment Scheduled: none Initial call taken by: Lucious Groves,  December 08, 2009 4:34 PM  Follow-up for Phone Call        ok for refill x 5 Follow-up by: Jacques Navy MD,  December 08, 2009 6:50 PM    Prescriptions: ATIVAN 0.5 MG TABS (LORAZEPAM) 1 by mouth AM, 2 PM  #90 x 5   Entered by:   Ami Bullins CMA   Authorized by:   Jacques Navy MD   Signed by:   Bill Salinas CMA on 12/11/2009   Method used:   Telephoned to ...       [facility]  [place] (retail)       [address]       [place], [place]  [address]       Ph: [phone] or [phone]       Fax: [phone]   RxID:   [id number]

## 2010-07-31 NOTE — Progress Notes (Signed)
Summary: diverticulitis/blood in stool  Phone Note Call from Patient Call back at Home Phone [phone]   Caller: daughter--Brenda Summary of Call: Patient daughter called stating that the patient is having a diverticulitis flare, c/o blood in stool, and abd pain. Please advise. Initial call taken by: Lucious Groves,  October 11, 2009 2:14 PM  Follow-up for Phone Call        if nauseated with vomiting or severe pain - to ED for possible admission (Dr Dorris Carnes off this PM). If not vomiting and pain is not too bad then Augmentin 875mg  two times a day x 10 days with f/u ov Friday Follow-up by: Jacques Navy MD,  October 11, 2009 2:54 PM  Additional Follow-up for Phone Call Additional follow up Details #1::        done and patient daughter notified. Additional Follow-up by: Lucious Groves,  October 11, 2009 5:07 PM    New/Updated Medications: AUGMENTIN 875-125 MG TABS (AMOXICILLIN-POT CLAVULANATE) 1 by mouth two times a day for 10 days Prescriptions: AUGMENTIN 875-125 MG TABS (AMOXICILLIN-POT CLAVULANATE) 1 by mouth two times a day for 10 days  #2 x 0   Entered by:   Lucious Groves   Authorized by:   Jacques Navy MD   Signed by:   Lucious Groves on 10/11/2009   Method used:   Electronically to        [facility]  [place] (retail)       [address]       [place], [place]  [address]       Ph: [phone] or [phone]       Fax: [phone]   RxID:   [id number]

## 2010-07-31 NOTE — Consult Note (Signed)
Summary: [facility]  [facility]   Imported By: Sherian Rein 04/13/2010 13:11:49  _____________________________________________________________________  External Attachment:    Type:   Image     Comment:   External Document

## 2010-07-31 NOTE — Assessment & Plan Note (Signed)
Summary: leg pain/SD   Vital Signs:  Patient profile:   77 year old female Height:      61 inches Weight:      130 pounds BMI:     24.65 O2 Sat:      96 % on Room air Temp:     97.5 degrees F oral Pulse rate:   67 / minute BP sitting:   142 / 62  (left arm) Cuff size:   regular  Vitals Entered By: Bill Salinas CMA (February 23, 2010 4:06 PM)  O2 Flow:  Room air CC: pt here with c/o pain in left leg x 3 weeks/ ab   Primary Care [place]:  Jacques Navy MD  CC:  pt here with c/o pain in left leg x 3 weeks/ ab.  History of Present Illness: Patient presents for refractory pain in the left buttock with radiation to the leg. She has been tried on vicodin without relief, gabapentin up to 300mg  three times a day without relief. Her activities have been curtailed by her pain and she is quite miserable.   Current Medications (verified): 1)  Cyanocobalamin 1000 Mcg/ml Soln (Cyanocobalamin) .... Inject Once Monthly 2)  Prilosec 20 Mg  Cpdr (Omeprazole) .... 2 Capsules Qam 3)  Clonidine Hcl 0.1 Mg  Tabs (Clonidine Hcl) .Marland Kitchen.. 1 Two Times A Day 4)  Remeron 30 Mg Tabs (Mirtazapine) .... Take 1 Tab By Mouth At Bedtime 5)  Ativan 0.5 Mg Tabs (Lorazepam) .Marland Kitchen.. 1 By Mouth Am, 2 Pm 6)  Diltiazem Hcl 120 Mg Tabs (Diltiazem Hcl) .Marland Kitchen.. 1 Once Daily 7)  Trazodone Hcl 50 Mg Tabs (Trazodone Hcl) .... 1/2 Tablet At Bedtime 8)  Trimethoprim 100 Mg Tabs (Trimethoprim) .Marland Kitchen.. 1 Once Daily 9)  Furosemide 40 Mg Tabs (Furosemide) .Marland Kitchen.. 1 By Mouth Once Daily 10)  Hydrocodone-Acetaminophen 5-325 Mg Tabs (Hydrocodone-Acetaminophen) .Marland Kitchen.. 1 Q 6 Hrs As Needed Pain 11)  Klor-Con M20 20 Meq Cr-Tabs (Potassium Chloride Crys Cr) .Marland Kitchen.. 1 Tablet By Mouth Once Daily 12)  Coricidin D Cold/flu/sinus 2-5-325 Mg Tabs (Chlorphen-Phenyleph-Apap) .... As Needed 13)  Proair Hfa 108 (90 Base) Mcg/act Aers (Albuterol Sulfate) .... 2 Puffs Q 4 Hrs As Needed 14)  Aerochamber W/flowsignal  Misc (Spacer/aero-Holding [place]) .... Use With  Metered Dose Inhaler 15)  Synthroid 25 Mcg Tabs (Levothyroxine Sodium) .Marland Kitchen.. 1 Tab Once Daily 16)  Anusol-Hc 25 Mg Supp (Hydrocortisone Acetate) .Marland Kitchen.. 1 Per Rectum Two Times A Day X 3 Days 17)  Diflucan 150 Mg Tabs (Fluconazole) .... Take One Dose Now 18)  Gabapentin 100 Mg Caps (Gabapentin) .Marland Kitchen.. 1 At Bedtime  Allergies (verified): 1)  ! Prednisone  Past History:  Past Medical History: Last updated: 06/13/2008 CANDIDIASIS (ICD-112.9) FIBRILLATION, ATRIAL (ICD-427.31) PERIPHERAL EDEMA (ICD-782.3) DYSPHAGIA UNSPECIFIED (ICD-787.20) HYPOKALEMIA (ICD-276.8) DEPRESSIVE TYPE PSYCHOSIS (ICD-298.0) UTI (ICD-599.0) SCIATICA (ICD-724.3) CONSTIPATION, CHRONIC (ICD-564.09) LEG PAIN, BILATERAL (ICD-729.5) HYPERTENSION (ICD-401.9) ANXIETY (ICD-300.00) Arthritis Gallstones GERD Hypertension Urinary Tract Infection  Past Surgical History: Last updated: 02/14/2007 Hysterectomy Gall Bladder PSH reviewed for relevance, FH reviewed for relevance  Review of Systems       The patient complains of difficulty walking.  The patient denies anorexia, fever, weight loss, chest pain, dyspnea on exertion, peripheral edema, headaches, abdominal pain, severe indigestion/heartburn, and muscle weakness.    Physical Exam  General:  ederly white female in no acute distress but uncomfortable Head:  normocephalic and atraumatic.   Eyes:  pupils equal and pupils round.   Neck:  supple.   Lungs:  normal respiratory  effort and normal breath sounds.   Heart:  normal rate and regular rhythm.   Abdomen:  soft and normal bowel sounds.   Msk:  no joint tenderness, no joint warmth, and no joint deformities.    Back exam - able to stand with 1+ assist; can flex at he waist; cannot support her weight on her toes or heels. Needed 2+ assist to the exam table (chronic); very diminished DTR at left patellar tendon. Pulses:  2+ radial Neurologic:  alert & oriented X3 and cranial nerves II-XII intact.   Skin:  color  normal and no suspicious lesions.   Psych:  Oriented X3, memory intact for recent and remote, normally interactive, and good eye contact.     Impression & Recommendations:  Problem # 1:  SCIATICA (ICD-724.3) Patient with progressive and severe pain that has not responded to gabapentin or vicodin. She does have a radicular finding of diminished reflex left leg. Despite her age she has been very stable medically and could tolerate microscopic diskectomy if needed. Given her degree of pain and lmitation in activities the diagnosis is worth pursuing.  Plan - MRI L-S spine          for short-term pain management OXYCODONE/APAP q 6          if long term pain managment is needed will change to fentanyl patch.          if MRI reveals acute disk that is amenable to limited intervention will refer to neurosurgery.   Her updated medication list for this problem includes:    Oxycodone-acetaminophen 5-325 Mg Tabs (Oxycodone-acetaminophen) .Marland Kitchen... 1 by mouth q6 for pain. watch for drowsiness and constipation  Complete Medication List: 1)  Cyanocobalamin 1000 Mcg/ml Soln (Cyanocobalamin) .... Inject once monthly 2)  Prilosec 20 Mg Cpdr (Omeprazole) .... 2 capsules qam 3)  Clonidine Hcl 0.1 Mg Tabs (Clonidine hcl) .Marland Kitchen.. 1 two times a day 4)  Remeron 30 Mg Tabs (Mirtazapine) .... Take 1 tab by mouth at bedtime 5)  Ativan 0.5 Mg Tabs (Lorazepam) .Marland Kitchen.. 1 by mouth am, 2 pm 6)  Diltiazem Hcl 120 Mg Tabs (Diltiazem hcl) .Marland Kitchen.. 1 once daily 7)  Trazodone Hcl 50 Mg Tabs (Trazodone hcl) .... 1/2 tablet at bedtime 8)  Trimethoprim 100 Mg Tabs (Trimethoprim) .Marland Kitchen.. 1 once daily 9)  Furosemide 40 Mg Tabs (Furosemide) .Marland Kitchen.. 1 by mouth once daily 10)  Oxycodone-acetaminophen 5-325 Mg Tabs (Oxycodone-acetaminophen) .Marland Kitchen.. 1 by mouth q6 for pain. watch for drowsiness and constipation 11)  Klor-con M20 20 Meq Cr-tabs (Potassium chloride crys cr) .Marland Kitchen.. 1 tablet by mouth once daily 12)  Coricidin D Cold/flu/sinus 2-5-325 Mg Tabs  (Chlorphen-phenyleph-apap) .... As needed 13)  Proair Hfa 108 (90 Base) Mcg/act Aers (Albuterol sulfate) .... 2 puffs q 4 hrs as needed 14)  Aerochamber W/flowsignal Misc (Spacer/aero-holding chambers) .... Use with metered dose inhaler 15)  Synthroid 25 Mcg Tabs (Levothyroxine sodium) .Marland Kitchen.. 1 tab once daily 16)  Anusol-hc 25 Mg Supp (Hydrocortisone acetate) .Marland Kitchen.. 1 per rectum two times a day x 3 days 17)  Diflucan 150 Mg Tabs (Fluconazole) .... Take one dose now 18)  Gabapentin 100 Mg Caps (Gabapentin) .Marland Kitchen.. 1 at bedtime  Other Orders: Radiology Referral (Radiology) Prescriptions: OXYCODONE-ACETAMINOPHEN 5-325 MG TABS (OXYCODONE-ACETAMINOPHEN) 1 by mouth q6 for pain. Watch for drowsiness and constipation  #60 x 0   Entered and Authorized by:   Jacques Navy MD   Signed by:   Jacques Navy MD on 02/23/2010   Method  used:   Handwritten   RxID:   [id number]

## 2010-07-31 NOTE — Progress Notes (Signed)
  Phone Note Refill Request Message from:  Fax from Pharmacy on July 10, 2009 8:14 AM  Refills Requested: Medication #1:  REMERON 30 MG TABS Take 1 tab by mouth at bedtime can pt have refill? Please Advise  Initial call taken by: Ami Bullins CMA,  July 10, 2009 8:15 AM  Follow-up for Phone Call        ok to refill prn Follow-up by: Jacques Navy MD,  July 10, 2009 8:53 AM    Prescriptions: REMERON 30 MG TABS (MIRTAZAPINE) Take 1 tab by mouth at bedtime  #30 x 3   Entered by:   Ami Bullins CMA   Authorized by:   Jacques Navy MD   Signed by:   Bill Salinas CMA on 07/10/2009   Method used:   [facility]        [facility]  [place] (retail)       [address]       [place], [place]  [address]       Ph: [phone] or [phone]       Fax: [phone]   RxID:   [id number]

## 2010-07-31 NOTE — Assessment & Plan Note (Signed)
Summary: NAUSEA-STOOL/BLOOD SOMETIMES NOT EVERY TIME-X END OF LAST WK STC   Vital Signs:  Patient profile:   77 year old female Height:      61 inches Weight:      135 pounds O2 Sat:      97 % on Room air Temp:     97.3 degrees F oral Pulse rate:   70 / minute BP sitting:   134 / 68  (left arm) Cuff size:   regular  Vitals Entered By: Bill Salinas CMA (July 31, 2009 5:04 PM)  O2 Flow:  Room air CC: pt here with complaint of pain in pubic region, she also has c/o occasional blood in stool but states it is a very little amount/ ab   Primary Care Provider:  Jacques Navy MD  CC:  pt here with complaint of pain in pubic region and she also has c/o occasional blood in stool but states it is a very little amount/ ab.  History of Present Illness: Patient presents for recurrent pain in the lower abdomen that is not like a UTI. she has had enough pain to make her cry. She has had some BMs with blood and mucus. No fever. She has had some nausea. She maintains a good appetite.  Current Medications (verified): 1)  Cyanocobalamin 1000 Mcg/ml Soln (Cyanocobalamin) .... Inject Once Monthly 2)  Prilosec 20 Mg  Cpdr (Omeprazole) .... 2 Capsules Qam 3)  Clonidine Hcl 0.1 Mg  Tabs (Clonidine Hcl) .Marland Kitchen.. 1 Two Times A Day 4)  Remeron 30 Mg Tabs (Mirtazapine) .... Take 1 Tab By Mouth At Bedtime 5)  Ativan 0.5 Mg Tabs (Lorazepam) .Marland Kitchen.. 1 By Mouth Am, 2 Pm 6)  Diltiazem Hcl 120 Mg Tabs (Diltiazem Hcl) .Marland Kitchen.. 1 Once Daily 7)  Trazodone Hcl 50 Mg Tabs (Trazodone Hcl) .... 1/2 Tablet At Bedtime 8)  Trimethoprim 100 Mg Tabs (Trimethoprim) .Marland Kitchen.. 1 Once Daily 9)  Furosemide 40 Mg Tabs (Furosemide) .Marland Kitchen.. 1 By Mouth Once Daily 10)  Hydrocodone-Acetaminophen 5-325 Mg Tabs (Hydrocodone-Acetaminophen) .Marland Kitchen.. 1 Q 6 Hrs As Needed Pain 11)  Klor-Con M20 20 Meq Cr-Tabs (Potassium Chloride Crys Cr) .Marland Kitchen.. 1 Tablet By Mouth Once Daily 12)  Coricidin D Cold/flu/sinus 2-5-325 Mg Tabs (Chlorphen-Phenyleph-Apap) .... As  Needed 13)  Proair Hfa 108 (90 Base) Mcg/act Aers (Albuterol Sulfate) .... 2 Puffs Q 4 Hrs As Needed 14)  Aerochamber W/flowsignal  Misc (Spacer/aero-Holding [place]) .... Use With Metered Dose Inhaler 15)  Synthroid 25 Mcg Tabs (Levothyroxine Sodium) .Marland Kitchen.. 1 Tab Once Daily 16)  Anusol-Hc 25 Mg Supp (Hydrocortisone Acetate) .Marland Kitchen.. 1 Per Rectum Two Times A Day X 3 Days  Allergies (verified): 1)  ! Prednisone  Past History:  Past Medical History: Last updated: 06/13/2008 CANDIDIASIS (ICD-112.9) FIBRILLATION, ATRIAL (ICD-427.31) PERIPHERAL EDEMA (ICD-782.3) DYSPHAGIA UNSPECIFIED (ICD-787.20) HYPOKALEMIA (ICD-276.8) DEPRESSIVE TYPE PSYCHOSIS (ICD-298.0) UTI (ICD-599.0) SCIATICA (ICD-724.3) CONSTIPATION, CHRONIC (ICD-564.09) LEG PAIN, BILATERAL (ICD-729.5) HYPERTENSION (ICD-401.9) ANXIETY (ICD-300.00) Arthritis Gallstones GERD Hypertension Urinary Tract Infection  Past Surgical History: Last updated: 02/14/2007 Hysterectomy Gall Bladder  Family History: Last updated: 06/13/2008 Non-contributory Family History of Breast Cancer: Family History of Heart Disease:   Social History: Last updated: 06/13/2008 Widowed. Lives with daughter, dependent for ADL's. Home health coming in (9/09) Discussed code status: will not do CPR, or mechanical ventilation. Patient has never smoked.  Alcohol Use - no Illicit Drug Use - no  Risk Factors: Smoking Status: never (06/13/2008)  Review of Systems       The patient complains of abdominal  pain.  The patient denies anorexia, fever, weight loss, chest pain, syncope, headaches, melena, hematochezia, incontinence, depression, and enlarged lymph nodes.    Physical Exam  General:  elderly white female who is in no distress at this time. Head:  normocephalic.   Lungs:  normal respiratory effort and normal breath sounds.   Heart:  normal rate and regular rhythm.   Abdomen:  soft, normal bowel sounds, no masses, no rigidity, and no rebound  tenderness.  There is diffuse tenderness that is greatest at the LLQ. Neurologic:  alert & oriented X3 and cranial nerves II-XII intact.   Skin:  turgor normal and color normal.   Cervical Nodes:  no anterior cervical adenopathy and no posterior cervical adenopathy.   Psych:  Oriented X3, memory intact for recent and remote, and normally interactive.     Impression & Recommendations:  Problem # 1:  ABDOMINAL PAIN, LEFT LOWER QUADRANT (ICD-789.04)  LLQ abdominal pain with no guarding or rebound, no fever, but positive for blood and mucus in stool sugestive of diverticulitis.  Plan - Augmentin 875 mg two times a dayx 10 days           call for increased pain, fever, inability to eat or drink.  Orders: Prescription Created Electronically [id number])  Complete Medication List: 1)  Cyanocobalamin 1000 Mcg/ml Soln (Cyanocobalamin) .... Inject once monthly 2)  Prilosec 20 Mg Cpdr (Omeprazole) .... 2 capsules qam 3)  Clonidine Hcl 0.1 Mg Tabs (Clonidine hcl) .Marland Kitchen.. 1 two times a day 4)  Remeron 30 Mg Tabs (Mirtazapine) .... Take 1 tab by mouth at bedtime 5)  Ativan 0.5 Mg Tabs (Lorazepam) .Marland Kitchen.. 1 by mouth am, 2 pm 6)  Diltiazem Hcl 120 Mg Tabs (Diltiazem hcl) .Marland Kitchen.. 1 once daily 7)  Trazodone Hcl 50 Mg Tabs (Trazodone hcl) .... 1/2 tablet at bedtime 8)  Trimethoprim 100 Mg Tabs (Trimethoprim) .Marland Kitchen.. 1 once daily 9)  Furosemide 40 Mg Tabs (Furosemide) .Marland Kitchen.. 1 by mouth once daily 10)  Hydrocodone-acetaminophen 5-325 Mg Tabs (Hydrocodone-acetaminophen) .Marland Kitchen.. 1 q 6 hrs as needed pain 11)  Klor-con M20 20 Meq Cr-tabs (Potassium chloride crys cr) .Marland Kitchen.. 1 tablet by mouth once daily 12)  Coricidin D Cold/flu/sinus 2-5-325 Mg Tabs (Chlorphen-phenyleph-apap) .... As needed 13)  Proair Hfa 108 (90 Base) Mcg/act Aers (Albuterol sulfate) .... 2 puffs q 4 hrs as needed 14)  Aerochamber W/flowsignal Misc (Spacer/aero-holding chambers) .... Use with metered dose inhaler 15)  Synthroid 25 Mcg Tabs (Levothyroxine sodium)  .Marland Kitchen.. 1 tab once daily 16)  Anusol-hc 25 Mg Supp (Hydrocortisone acetate) .Marland Kitchen.. 1 per rectum two times a day x 3 days 17)  Amoxicillin-pot Clavulanate 875-125 Mg Tabs (Amoxicillin-pot clavulanate) .Marland Kitchen.. 1 by mouth two times a day x 10 Prescriptions: AMOXICILLIN-POT CLAVULANATE 875-125 MG TABS (AMOXICILLIN-POT CLAVULANATE) 1 by mouth two times a day x 10  #20 x 0   Entered and Authorized by:   Jacques Navy MD   Signed by:   Jacques Navy MD on 08/01/2009   Method used:   [facility]        [facility]  [place] ([facility]ail)       [address]       [place], [place]  [address]       Ph: [phone] or [phone]       Fax: [phone]   RxID:   [id number]

## 2010-07-31 NOTE — Progress Notes (Signed)
Summary: REQ FOR RX  Phone Note Call from Patient   Summary of Call: Pt's daughter called. Pt c/o vaginal yeast infection, itching and d/c, some has been bloody. She is req rx.  Initial call taken by: Lamar Sprinkles, CMA,  September 14, 2009 10:38 AM  Follow-up for Phone Call        diflucan 100mg  qde x 10 days.  Follow-up by: Jacques Navy MD,  September 14, 2009 12:58 PM  Additional Follow-up for Phone Call Additional follow up Details #1::        Pt's daughter informed Additional Follow-up by: Lamar Sprinkles, CMA,  September 14, 2009 5:32 PM    New/Updated Medications: DIFLUCAN 100 MG TABS (FLUCONAZOLE) 1 once daily x 10 days Prescriptions: DIFLUCAN 100 MG TABS (FLUCONAZOLE) 1 once daily x 10 days  #10 x 0   Entered by:   Lamar Sprinkles, CMA   Authorized by:   Jacques Navy MD   Signed by:   Lamar Sprinkles, CMA on 09/14/2009   Method used:   [facility]        [facility]  [place] (retail)       [address]       [place], [place]  [address]       Ph: [phone] or [phone]       Fax: [phone]   RxID:   [id number]

## 2010-07-31 NOTE — Progress Notes (Signed)
  Phone Note Refill Request Message from:  Fax from Pharmacy on January 31, 2010 2:37 PM  Refills Requested: Medication #1:  HYDROCODONE-ACETAMINOPHEN 5-325 MG TABS 1 q 6 hrs as needed pain   Last Refilled: 10/19/2009 [facility] [place] Please Advise refill  Initial call taken by: Ami Bullins CMA,  January 31, 2010 2:37 PM  Follow-up for Phone Call        ok for refill x 3 Follow-up by: Jacques Navy MD,  January 31, 2010 3:42 PM    Prescriptions: HYDROCODONE-ACETAMINOPHEN 5-325 MG TABS (HYDROCODONE-ACETAMINOPHEN) 1 q 6 hrs as needed pain  #120 x 3   Entered by:   Bill Salinas CMA   Authorized by:   Jacques Navy MD   Signed by:   Bill Salinas CMA on 01/31/2010   Method used:   Telephoned to ...       [facility]  [place] (retail)       [address]       [place], [place]  [address]       Ph: [phone] or [phone]       Fax: [phone]   RxID:   [id number]

## 2010-07-31 NOTE — Progress Notes (Signed)
Summary: REFILL   Phone Note Refill Request Message from:  Pharmacy  Refills Requested: Medication #1:  TRAZODONE HCL 50 MG TABS 1/2 tablet at bedtime  Medication #2:  REMERON 30 MG TABS Take 1 tab by mouth at bedtime Initial call taken by: Lamar Sprinkles, CMA,  Nov 28, 2009 8:14 AM  Follow-up for Phone Call        ok for refills as needed  Follow-up by: Jacques Navy MD,  Nov 28, 2009 10:28 AM  Additional Follow-up for Phone Call Additional follow up Details #1::        [facility] Additional Follow-up by: Orlan Leavens,  Nov 28, 2009 1:16 PM    Prescriptions: TRAZODONE HCL 50 MG TABS (TRAZODONE HCL) 1/2 tablet at bedtime  #15 Tablet x 3   Entered by:   Orlan Leavens   Authorized by:   Jacques Navy MD   Signed by:   Orlan Leavens on 11/28/2009   Method used:   Electronically to        [facility]  [place] (retail)       [address]       [place], [place]  [address]       Ph: [phone] or [phone]       Fax: [phone]   RxID:   [id number] REMERON 30 MG TABS (MIRTAZAPINE) Take 1 tab by mouth at bedtime  #30 x 3   Entered by:   Orlan Leavens   Authorized by:   Jacques Navy MD   Signed by:   Orlan Leavens on 11/28/2009   Method used:   Electronically to        [facility]  [place] (retail)       [address]       [place], [place]  [address]       Ph: [phone] or [phone]       Fax: [phone]   RxID:   [id number]

## 2010-07-31 NOTE — Progress Notes (Signed)
Summary: YEAST INFECTION  Phone Note Call from Patient   Summary of Call: Pt's daughter called. Pt has been on an antibiotic recently and is c/o vaginal itching. Concerned that pt may have another yeast infection. Daughter is req rx.  Initial call taken by: Lamar Sprinkles, CMA,  Oct 30, 2009 10:14 AM  Follow-up for Phone Call        ok for diflucan Stat 150mg  sig take 1 x 1, #1 Follow-up by: Jacques Navy MD,  Oct 30, 2009 12:50 PM  Additional Follow-up for Phone Call Additional follow up Details #1::        Left vm for pt's daughter Additional Follow-up by: Lamar Sprinkles, CMA,  Oct 30, 2009 1:35 PM    New/Updated Medications: DIFLUCAN 150 MG TABS (FLUCONAZOLE) take one dose now Prescriptions: DIFLUCAN 150 MG TABS (FLUCONAZOLE) take one dose now  #1 x 1   Entered by:   Lamar Sprinkles, CMA   Authorized by:   Jacques Navy MD   Signed by:   Lamar Sprinkles, CMA on 10/30/2009   Method used:   Electronically to        [facility]  [place] (retail)       [address]       [place], [place]  [address]       Ph: [phone] or [phone]       Fax: [phone]   RxID:   [id number]

## 2010-07-31 NOTE — Progress Notes (Signed)
Summary: MRi status  Phone Note Call from Patient Call back at Home Phone [phone]   Caller: Daughter--Brenda Summary of Call: Patient daughter is calling to check on status of MRI. Initial call taken by: Lucious Groves CMA,  February 28, 2010 10:33 AM  Follow-up for Phone Call        Appt scheduled for 03-06-2010@4 :00 pm [facility] pt daughter  informed of this appt Shelbie Proctor  February 28, 2010 11:48 AM

## 2010-07-31 NOTE — Progress Notes (Signed)
  Phone Note Refill Request Message from:  Fax from Pharmacy on December 05, 2009 4:35 PM  Refills Requested: Medication #1:  TRIMETHOPRIM 100 MG TABS 1 once daily Initial call taken by: Ami Bullins CMA,  December 05, 2009 4:35 PM    Prescriptions: TRIMETHOPRIM 100 MG TABS (TRIMETHOPRIM) 1 once daily  #30 Tablet x 2   Entered by:   Ami Bullins CMA   Authorized by:   Jacques Navy MD   Signed by:   Bill Salinas CMA on 12/05/2009   Method used:   Faxed to ...       [facility]  [place] (retail)       [address]       [place], [place]  [address]       Ph: [phone] or [phone]       Fax: [phone]   RxID:   [id number]

## 2010-07-31 NOTE — Progress Notes (Signed)
Summary: question  Phone Note Call from Patient Call back at Home Phone [phone]   Caller: Daughter Summary of Call: Daughter wants to know if the  patient is still supposed to take Hydrocodone along with Gabapentin? Please advise. Initial call taken by: Lucious Groves CMA,  February 09, 2010 9:20 AM  Follow-up for Phone Call        Yes, on za as needed basis Follow-up by: Jacques Navy MD,  February 09, 2010 2:29 PM  Additional Follow-up for Phone Call Additional follow up Details #1::        left detailed vm for daughter on home # Additional Follow-up by: Lamar Sprinkles, CMA,  February 09, 2010 2:33 PM

## 2010-07-31 NOTE — Progress Notes (Signed)
Summary: Gabapentin  Phone Note Call from Patient   Caller: Daughter Call For: [phone]) and [phone] (cell) Summary of Call: Patient daughter called stating that pt is on her last day of Gabapentin and would like to know if MD wants her to continue taking med. She stats that pt is still having the sciatic nerve/ left leg pain. Please advise Initial call taken by: Rock Nephew CMA,  February 16, 2010 10:30 AM  Follow-up for Phone Call        may increase gabapentin to 600mg  three times a day if she is tolerating her current dose. #90 refill x 3 Follow-up by: Jacques Navy MD,  February 16, 2010 1:09 PM  Additional Follow-up for Phone Call Additional follow up Details #1::        See previous phone note. Pt is currently taking gabapentin 100mg  1 three times a day, advised to increase as previous phone note - 2 three times a day x 3,  than 3 three times a day. After increase brenda will call office w/update.  Additional Follow-up by: Lamar Sprinkles, CMA,  February 16, 2010 2:39 PM

## 2010-07-31 NOTE — Assessment & Plan Note (Signed)
Summary: per ami--fu--divertic--stc   Vital Signs:  Patient profile:   77 year old female Height:      61 inches Weight:      129 pounds O2 Sat:      96 % on Room air Temp:     98.0 degrees F oral Pulse rate:   78 / minute BP sitting:   114 / 60  (left arm) Cuff size:   regular  Vitals Entered By: Bill Salinas CMA (October 16, 2009 3:10 PM)  O2 Flow:  Room air CC: pt here with complaint of lower abd pain with diarrhea/ ab   Primary Care Provider:  Jacques Navy MD  CC:  pt here with complaint of lower abd pain with diarrhea/ ab.  History of Present Illness: Patient presents for follow-up of possible diverticulitis. She ws treated Jan 31, '11. She called April 13th for recurrent symptoms and by phone was started on another round of Augmentin 875mg  two times a day. Despite the antibiotics she has had abdominal pain and blood diarrhea. No fever. She has felt cold and reports chills. She continues to take Augmentin (will finish April 26th.) Decrased appetite - intentional care with diet . She has lost 6 lbs since January.   Current Medications (verified): 1)  Cyanocobalamin 1000 Mcg/ml Soln (Cyanocobalamin) .... Inject Once Monthly 2)  Prilosec 20 Mg  Cpdr (Omeprazole) .... 2 Capsules Qam 3)  Clonidine Hcl 0.1 Mg  Tabs (Clonidine Hcl) .Marland Kitchen.. 1 Two Times A Day 4)  Remeron 30 Mg Tabs (Mirtazapine) .... Take 1 Tab By Mouth At Bedtime 5)  Ativan 0.5 Mg Tabs (Lorazepam) .Marland Kitchen.. 1 By Mouth Am, 2 Pm 6)  Diltiazem Hcl 120 Mg Tabs (Diltiazem Hcl) .Marland Kitchen.. 1 Once Daily 7)  Trazodone Hcl 50 Mg Tabs (Trazodone Hcl) .... 1/2 Tablet At Bedtime 8)  Trimethoprim 100 Mg Tabs (Trimethoprim) .Marland Kitchen.. 1 Once Daily 9)  Furosemide 40 Mg Tabs (Furosemide) .Marland Kitchen.. 1 By Mouth Once Daily 10)  Hydrocodone-Acetaminophen 5-325 Mg Tabs (Hydrocodone-Acetaminophen) .Marland Kitchen.. 1 Q 6 Hrs As Needed Pain 11)  Klor-Con M20 20 Meq Cr-Tabs (Potassium Chloride Crys Cr) .Marland Kitchen.. 1 Tablet By Mouth Once Daily 12)  Coricidin D Cold/flu/sinus 2-5-325  Mg Tabs (Chlorphen-Phenyleph-Apap) .... As Needed 13)  Proair Hfa 108 (90 Base) Mcg/act Aers (Albuterol Sulfate) .... 2 Puffs Q 4 Hrs As Needed 14)  Aerochamber W/flowsignal  Misc (Spacer/aero-Holding [place]) .... Use With Metered Dose Inhaler 15)  Synthroid 25 Mcg Tabs (Levothyroxine Sodium) .Marland Kitchen.. 1 Tab Once Daily 16)  Anusol-Hc 25 Mg Supp (Hydrocortisone Acetate) .Marland Kitchen.. 1 Per Rectum Two Times A Day X 3 Days 17)  Augmentin 875-125 Mg Tabs (Amoxicillin-Pot Clavulanate) .Marland Kitchen.. 1 By Mouth Two Times A Day For 10 Days  Allergies (verified): 1)  ! Prednisone  Past History:  Past Medical History: Last updated: 06/13/2008 CANDIDIASIS (ICD-112.9) FIBRILLATION, ATRIAL (ICD-427.31) PERIPHERAL EDEMA (ICD-782.3) DYSPHAGIA UNSPECIFIED (ICD-787.20) HYPOKALEMIA (ICD-276.8) DEPRESSIVE TYPE PSYCHOSIS (ICD-298.0) UTI (ICD-599.0) SCIATICA (ICD-724.3) CONSTIPATION, CHRONIC (ICD-564.09) LEG PAIN, BILATERAL (ICD-729.5) HYPERTENSION (ICD-401.9) ANXIETY (ICD-300.00) Arthritis Gallstones GERD Hypertension Urinary Tract Infection  Past Surgical History: Last updated: 02/14/2007 Hysterectomy Gall Bladder  Family History: Last updated: 06/13/2008 Non-contributory Family History of Breast Cancer: Family History of Heart Disease:   Social History: Last updated: 06/13/2008 Widowed. Lives with daughter, dependent for ADL's. Home health coming in (9/09) Discussed code status: will not do CPR, or mechanical ventilation. Patient has never smoked.  Alcohol Use - no Illicit Drug Use - no  Risk Factors: Smoking Status: never (06/13/2008)  Review of Systems       The patient complains of fever, weight loss, decreased hearing, abdominal pain, hematochezia, and incontinence.  The patient denies anorexia, weight gain, chest pain, dyspnea on exertion, headaches, hemoptysis, severe indigestion/heartburn, suspicious skin lesions, unusual weight change, abnormal bleeding, and angioedema.    Physical  Exam  General:  Very elderly white female in no acute distress Head:  normocephalic and atraumatic.   Lungs:  normal respiratory effort and normal breath sounds.   Heart:  normal rate and regular rhythm.   Abdomen:  soft, normal bowel sounds, no distention, no guarding, and no rigidity.  Tender in the lower quadrants, greatest at the left.  Msk:  no joint swelling, no joint warmth, and no redness over joints.   Pulses:  2+ radial Neurologic:  alert & oriented X3, cranial nerves II-XII intact, and gait normal.   Skin:  poor turgor, normal color, no lesions Cervical Nodes:  no anterior cervical adenopathy and no posterior cervical adenopathy.   Psych:  Oriented X3, memory intact for recent and remote, normally interactive, and good eye contact.     Impression & Recommendations:  Problem # 1:  DIVERTICULITIS OF COLON (ICD-562.11) Patient was empirically started on Augmentin but she continues to have discomfort.  Plan - CBC to r/o active infection; chemistries           CT abdomen.  Orders: TLB-BMP (Basic Metabolic Panel-BMET) (80048-METABOL) TLB-CBC Platelet - w/Differential (85025-CBCD) TLB-Hepatic/Liver Function Pnl (80076-HEPATIC) Radiology Referral (Radiology)  Addendum - white count normal.                      CT scan without evidence of acute diveriticulitis.   Complete Medication List: 1)  Cyanocobalamin 1000 Mcg/ml Soln (Cyanocobalamin) .... Inject once monthly 2)  Prilosec 20 Mg Cpdr (Omeprazole) .... 2 capsules qam 3)  Clonidine Hcl 0.1 Mg Tabs (Clonidine hcl) .Marland Kitchen.. 1 two times a day 4)  Remeron 30 Mg Tabs (Mirtazapine) .... Take 1 tab by mouth at bedtime 5)  Ativan 0.5 Mg Tabs (Lorazepam) .Marland Kitchen.. 1 by mouth am, 2 pm 6)  Diltiazem Hcl 120 Mg Tabs (Diltiazem hcl) .Marland Kitchen.. 1 once daily 7)  Trazodone Hcl 50 Mg Tabs (Trazodone hcl) .... 1/2 tablet at bedtime 8)  Trimethoprim 100 Mg Tabs (Trimethoprim) .Marland Kitchen.. 1 once daily 9)  Furosemide 40 Mg Tabs (Furosemide) .Marland Kitchen.. 1 by mouth once  daily 10)  Hydrocodone-acetaminophen 5-325 Mg Tabs (Hydrocodone-acetaminophen) .Marland Kitchen.. 1 q 6 hrs as needed pain 11)  Klor-con M20 20 Meq Cr-tabs (Potassium chloride crys cr) .Marland Kitchen.. 1 tablet by mouth once daily 12)  Coricidin D Cold/flu/sinus 2-5-325 Mg Tabs (Chlorphen-phenyleph-apap) .... As needed 13)  Proair Hfa 108 (90 Base) Mcg/act Aers (Albuterol sulfate) .... 2 puffs q 4 hrs as needed 14)  Aerochamber W/flowsignal Misc (Spacer/aero-holding chambers) .... Use with metered dose inhaler 15)  Synthroid 25 Mcg Tabs (Levothyroxine sodium) .Marland Kitchen.. 1 tab once daily 16)  Anusol-hc 25 Mg Supp (Hydrocortisone acetate) .Marland Kitchen.. 1 per rectum two times a day x 3 days 17)  Augmentin 875-125 Mg Tabs (Amoxicillin-pot clavulanate) .Marland Kitchen.. 1 by mouth two times a day for 10 days  Patient: [name] [name] Note: All result statuses are Final unless otherwise noted.  Tests: (1) BMP (METABOL)   Sodium                    142 mEq/L  135-145   Potassium                 4.6 mEq/L                   3.5-5.1   Chloride                  104 mEq/L                   96-112   Carbon Dioxide            29 mEq/L                    19-32   Glucose              [H]  107 mg/dL                   95-28   BUN                       23 mg/dL                    4-13   Creatinine           [H]  2.0 mg/dL                   2.4-4.0   Calcium                   9.0 mg/dL                   1.0-27.2   GFR                       24.81 mL/min                >60  Tests: (2) CBC Platelet w/Diff (CBCD)   White Cell Count          7.2 K/uL                    4.5-10.5   Red Cell Count       [L]  3.81 Mil/uL                 3.87-5.11   Hemoglobin           [L]  11.9 g/dL                   53.6-64.4   Hematocrit           [L]  34.5 %                      36.0-46.0   MCV                       90.6 fl                     78.0-100.0   MCHC                      34.4 g/dL                   03.4-74.2   RDW                  [H]  14.7 %  11.5-14.6   Platelet Count            235.0 K/uL                  150.0-400.0   Neutrophil %              71.8 %                      43.0-77.0   Lymphocyte %              17.9 %                      12.0-46.0   Monocyte %                8.4 %                       3.0-12.0   Eosinophils%              1.2 %                       0.0-5.0   Basophils %               0.7 %                       0.0-3.0   Neutrophill Absolute      5.2 K/uL                    1.4-7.7   Lymphocyte Absolute       1.3 K/uL                    0.7-4.0   Monocyte Absolute         0.6 K/uL                    0.1-1.0  Eosinophils, Absolute                             0.1 K/uL                    0.0-0.7   Basophils Absolute        0.1 K/uL                    0.0-0.1  Tests: (3) Hepatic/Liver Function Panel (HEPATIC)   Total Bilirubin           0.4 mg/dL                   6.0-6.3   Direct Bilirubin          0.1 mg/dL                   0.1-6.0   Alkaline Phosphatase [H]  123 U/L                     39-117   AST                       30 U/L                      0-37   ALT  29 U/L                      0-35   Total Protein             7.0 g/dL                    1.6-1.0   Albumin                   3.7 g/dL                    9.6-0.4

## 2010-07-31 NOTE — Progress Notes (Signed)
Summary: RF  Phone Note Refill Request   Refills Requested: Medication #1:  REMERON 30 MG TABS Take 1 tab by mouth at bedtime  Medication #2:  TRAZODONE HCL 50 MG TABS 1/2 tablet at bedtime Please Advise refill for pt  Initial call taken by: Ami Bullins CMA,  March 23, 2010 9:29 AM  Follow-up for Phone Call        ok for refills x 5 Follow-up by: Jacques Navy MD,  March 23, 2010 6:24 PM    Prescriptions: TRAZODONE HCL 50 MG TABS (TRAZODONE HCL) 1/2 tablet at bedtime  #15 x 5   Entered by:   Lamar Sprinkles, CMA   Authorized by:   Jacques Navy MD   Signed by:   Lamar Sprinkles, CMA on 03/23/2010   Method used:   Telephoned to ...       [facility]  [place]       [address]       [place], [place]  [address]       Ph: [phone] or [phone]       Fax: [phone]   RxID:   [id number] REMERON 30 MG TABS (MIRTAZAPINE) Take 1 tab by mouth at bedtime  #30 x 5   Entered by:   Lamar Sprinkles, CMA   Authorized by:   Jacques Navy MD   Signed by:   Lamar Sprinkles, CMA on 03/23/2010   Method used:   Telephoned to ...       [facility]  [place] (retail)       [address]       [place], [place]  [address]       Ph: [phone] or [phone]       Fax: [phone]   RxID:   [id number]

## 2010-07-31 NOTE — Progress Notes (Signed)
Summary: refill request  Phone Note Call from Patient   Caller: Daughter Steward Drone Summary of Call: Dtr wanted to check to see if Dr wants Pt to continue taking Oxycodone or the Hydrocodone. Pharmacy will not fill. Thinks she was given a 15 day supply of rx. Please advise.  Initial call taken by: Alysia Penna,  April 30, 2010 1:55 PM  Follow-up for Phone Call        reviewed records and correspondence: patinet has been doing well on oxycodone/APAP5/325. OK for refill - will need hard copy Rx #90/mon with 3 scripts - fill on or after.  Follow-up by: Jacques Navy MD,  April 30, 2010 2:22 PM  Additional Follow-up for Phone Call Additional follow up Details #1::        Pt's daughter Steward Drone called again requesting a call back in reference to above medications. Should I print Rx to be signed from today's date? Margaret Pyle, CMA  May 02, 2010 1:00 PM     Additional Follow-up for Phone Call Additional follow up Details #2::    Spoke with Steward Drone, pts daughter. SHe states the pt takes 3 oxycodone a day so the qty of 60 is not lasting her a full month. can you advise me on a qty. she still has nov and dec prescriptions.  Follow-up by: Ami Bullins CMA,  May 02, 2010 4:46 PM  Additional Follow-up for Phone Call Additional follow up Details #3:: Details for Additional Follow-up Action Taken: ok to redo Rx for #90 and exchange them. Also we can notify the pharmacy of the change in dosing so that they will not question as early refill.  Thanks Additional Follow-up by: Jacques Navy MD,  May 02, 2010 9:08 PM  New/Updated Medications: OXYCODONE-ACETAMINOPHEN 5-325 MG TABS (OXYCODONE-ACETAMINOPHEN) 1 by mouth q6 for pain. Watch for drowsiness and constipation Fill on or after 05/04/10 OXYCODONE-ACETAMINOPHEN 5-325 MG TABS (OXYCODONE-ACETAMINOPHEN) 1 by mouth q6 for pain. Watch for drowsiness and constipation Fill on or after 06/03/10 OXYCODONE-ACETAMINOPHEN 5-325 MG  TABS (OXYCODONE-ACETAMINOPHEN) 1 by mouth q6 for pain. Watch for drowsiness and constipation Fill on or after 07/04/10 Prescriptions: OXYCODONE-ACETAMINOPHEN 5-325 MG TABS (OXYCODONE-ACETAMINOPHEN) 1 by mouth q6 for pain. Watch for drowsiness and constipation Fill on or after 07/04/10  #90 x 0   Entered by:   Ami Bullins CMA   Authorized by:   Jacques Navy MD   Signed by:   Bill Salinas CMA on 05/04/2010   Method used:   Print then Give to Patient   RxID:   [id number] OXYCODONE-ACETAMINOPHEN 5-325 MG TABS (OXYCODONE-ACETAMINOPHEN) 1 by mouth q6 for pain. Watch for drowsiness and constipation Fill on or after 06/03/10  #90 x 0   Entered by:   Ami Bullins CMA   Authorized by:   Jacques Navy MD   Signed by:   Bill Salinas CMA on 05/04/2010   Method used:   Print then Give to Patient   RxID:   [id number] OXYCODONE-ACETAMINOPHEN 5-325 MG TABS (OXYCODONE-ACETAMINOPHEN) 1 by mouth q6 for pain. Watch for drowsiness and constipation Fill on or after 05/04/10  #90 x 0   Entered by:   Ami Bullins CMA   Authorized by:   Jacques Navy MD   Signed by:   Bill Salinas CMA on 05/04/2010   Method used:   Print then Give to Patient   RxID:   [id number]  [facility] [place] and informed Jim the pharmacist. that pt will be  coming in with new prescriptions. Lucile Shutters (pt's daughter) brought in old prescriptions in return for the new rxs/Ami Bullins CMA  May 05, 2010 10:06 AM

## 2010-07-31 NOTE — Progress Notes (Signed)
Summary: yeast infection  Phone Note Call from Patient   Caller: Daughter--Brenda [phone] Summary of Call: Patient daughter left message on triage that the patient did complete her Diflucan, but still has a yeast infection. Can the patient take another round of this med or does she need to come in? Please advise. Initial call taken by: Lucious Groves,  September 27, 2009 2:03 PM  Follow-up for Phone Call        OK for another round of diflucan 100mg  once daily x 10 days. Follow-up by: Jacques Navy MD,  September 27, 2009 2:35 PM    New/Updated Medications: DIFLUCAN 100 MG TABS (FLUCONAZOLE) 1 tab once a day for 10days Prescriptions: DIFLUCAN 100 MG TABS (FLUCONAZOLE) 1 tab once a day for 10days  #10 x 0   Entered by:   Ami Bullins CMA   Authorized by:   Jacques Navy MD   Signed by:   Bill Salinas CMA on 09/27/2009   Method used:   Electronically [facility]        [facility]  [place] (retail)       [address]       [place], [place]  [address]       Ph: [phone] or [phone]       Fax: [phone]   RxID:   [id number]

## 2010-07-31 NOTE — Progress Notes (Signed)
Summary: hydrocodone  Phone Note Refill Request Message from:  Fax from Pharmacy on October 19, 2009 2:05 PM  Refills Requested: Medication #1:  HYDROCODONE-ACETAMINOPHEN 5-325 MG TABS 1 q 6 hrs as needed pain @ 120   Last Ref[facility]: [facility] [place] [phone] Last ov 10/06/09  Next Appointment Scheduled: none Initial call taken by: Orlan Leavens,  October 19, 2009 2:06 PM  Follow-up for Phone Call        Dr. Debby Bud is this ok to refill? Follow-up by: Orlan Leavens,  October 19, 2009 2:07 PM  Additional Follow-up for Phone Call Additional follow up Details #1::        ok to refill x1 Additional Follow-up by: Jacques Navy MD,  October 19, 2009 2:11 PM    Additional Follow-up for Phone Call Additional follow up Details #2::    [facility] spoke [facility]/pharmacist ok # 120 only Follow-up by: Orlan Leavens,  October 19, 2009 3:57 PM  Prescriptions: HYDROCODONE-ACETAMINOPHEN 5-325 MG TABS (HYDROCODONE-ACETAMINOPHEN) 1 q 6 hrs as needed pain  #120 x 0   Entered by:   Orlan Leavens   Authorized by:   Jacques Navy MD   Signed by:   Orlan Leavens on 10/19/2009   Method used:   Telephoned to ...       [facility]  [place] (retail)       [address]       [place], [place]  [address]       Ph: [phone] or [phone]       Fax: [phone]   RxID:   [id number]

## 2010-07-31 NOTE — Progress Notes (Signed)
  Phone Note Refill Request   Refills Requested: Medication #1:  OXYCODONE-ACETAMINOPHEN 5-325 MG TABS 1 by mouth q6 for pain. Watch for drowsiness and constipation please Advise  Initial call taken by: Ami Bullins CMA,  March 16, 2010 12:10 PM  Follow-up for Phone Call        OK. Rx printed and signed ready for pick-up. Follow-up by: Jacques Navy MD,  March 16, 2010 1:07 PM  Additional Follow-up for Phone Call Additional follow up Details #1::        informed pts daughter Additional Follow-up by: Ami Bullins CMA,  March 16, 2010 2:25 PM    Prescriptions: OXYCODONE-ACETAMINOPHEN 5-325 MG TABS (OXYCODONE-ACETAMINOPHEN) 1 by mouth q6 for pain. Watch for drowsiness and constipation  #60 x 0   Entered and Authorized by:   Jacques Navy MD   Signed by:   Jacques Navy MD on 03/16/2010   Method used:   Print then Give to Patient   RxID:   [id number]

## 2010-07-31 NOTE — Letter (Signed)
Summary: [facility]  [facility]   Imported By: Lester [place] 12020/05/110 08:39:08  _____________________________________________________________________  External Attachment:    Type:   Image     Comment:   External Document

## 2010-08-02 NOTE — Progress Notes (Signed)
  Phone Note Refill Request   Albuterol for nebulizer, refill [facility] [address] [place]. Pt takes one nebulizer treatment at bedtime as needed . But she states direction read q 6 hours as needed .  Initial call taken by: Ami Bullins CMA,  June 15, 2010 5:07 PM  Follow-up for Phone Call        nebulizer treatments no on  medlist, ProAir is. There is a cost advantage to using nebulizer. OK for albuterol 2.5mg  soln # 90 for neb treatment at bedtime and q 6 as needed.  Follow-up by: Jacques Navy MD,  June 15, 2010 6:00 PM    New/Updated Medications: ALBUTEROL SULFATE (2.5 MG/3ML) 0.083% NEBU (ALBUTEROL SULFATE) 1 nebulizer treatment at bedtime and q6 as needed Prescriptions: ALBUTEROL SULFATE (2.5 MG/3ML) 0.083% NEBU (ALBUTEROL SULFATE) 1 nebulizer treatment at bedtime and q6 as needed  #1 month supp x 2   Entered by:   Ami Bullins CMA   Authorized by:   Jacques Navy MD   Signed by:   Bill Salinas CMA on 06/18/2010   Method used:   Electronically to        [facility]  [place] (retail)       [address]       [place], [place]  [address]       Ph: [phone] or [phone]       Fax: [phone]   RxID:   [id number]

## 2010-08-02 NOTE — Progress Notes (Signed)
  Phone Note Refill Request Message from:  Fax from Pharmacy on June 12, 2010 9:18 AM  Refills Requested: Medication #1:  ATIVAN 0.5 MG TABS 1 by mouth AM   Last Refilled: 05/09/2010 fax from [facility] [address] [facility] [place], please Advise refills  Initial call taken by: Ami Bullins CMA,  June 12, 2010 9:19 AM  Follow-up for Phone Call        ok for refill x 5 Follow-up by: Jacques Navy MD,  June 12, 2010 12:48 PM    Prescriptions: ATIVAN 0.5 MG TABS (LORAZEPAM) 1 by mouth AM, 2 PM  #90 x 5   Entered by:   Ami Bullins CMA   Authorized by:   Jacques Navy MD   Signed by:   Bill Salinas CMA on 06/12/2010   Method used:   Telephoned to ...       [facility]  [place] (retail)       [address]       [place], [place]  [address]       Ph: [phone] or [phone]       Fax: [phone]   RxID:   [id number]

## 2010-08-17 ENCOUNTER — Other Ambulatory Visit: Payer: Medicare Other

## 2010-08-17 ENCOUNTER — Ambulatory Visit (INDEPENDENT_AMBULATORY_CARE_PROVIDER_SITE_OTHER): Payer: Medicare Other | Admitting: Internal Medicine

## 2010-08-17 ENCOUNTER — Other Ambulatory Visit: Payer: Self-pay | Admitting: Internal Medicine

## 2010-08-17 ENCOUNTER — Encounter: Payer: Self-pay | Admitting: Internal Medicine

## 2010-08-17 DIAGNOSIS — R0989 Other specified symptoms and signs involving the circulatory and respiratory systems: Secondary | ICD-10-CM

## 2010-08-17 DIAGNOSIS — Z0000 Encounter for general adult medical examination without abnormal findings: Secondary | ICD-10-CM

## 2010-08-17 DIAGNOSIS — I509 Heart failure, unspecified: Secondary | ICD-10-CM

## 2010-08-17 DIAGNOSIS — E039 Hypothyroidism, unspecified: Secondary | ICD-10-CM

## 2010-08-17 DIAGNOSIS — E538 Deficiency of other specified B group vitamins: Secondary | ICD-10-CM

## 2010-08-17 DIAGNOSIS — I10 Essential (primary) hypertension: Secondary | ICD-10-CM

## 2010-08-17 DIAGNOSIS — R0609 Other forms of dyspnea: Secondary | ICD-10-CM

## 2010-08-17 DIAGNOSIS — K269 Duodenal ulcer, unspecified as acute or chronic, without hemorrhage or perforation: Secondary | ICD-10-CM

## 2010-08-17 DIAGNOSIS — F323 Major depressive disorder, single episode, severe with psychotic features: Secondary | ICD-10-CM

## 2010-08-17 LAB — CONVERTED CEMR LAB
Basophils Absolute: 0.1 10*3/uL (ref 0.0–0.1)
Basophils Relative: 1 % (ref 0–1)
Eosinophils Relative: 1 % (ref 0–5)
HCT: 40.5 % (ref 36.0–46.0)
Hemoglobin: 13.3 g/dL (ref 12.0–15.0)
MCHC: 32.8 g/dL (ref 30.0–36.0)
Monocytes Absolute: 0.6 10*3/uL (ref 0.1–1.0)
RDW: 13.6 % (ref 11.5–15.5)

## 2010-08-17 LAB — BASIC METABOLIC PANEL
BUN: 25 mg/dL — ABNORMAL HIGH (ref 6–23)
CO2: 28 mEq/L (ref 19–32)
Chloride: 103 mEq/L (ref 96–112)
Creatinine, Ser: 1.7 mg/dL — ABNORMAL HIGH (ref 0.4–1.2)

## 2010-08-17 LAB — TSH: TSH: 4.49 u[IU]/mL (ref 0.35–5.50)

## 2010-08-28 NOTE — Assessment & Plan Note (Signed)
Summary: yearly medicare wellness exam   Vital Signs:  Patient profile:   77 year old female Height:      59.50 inches (151.13 cm) Weight:      125.50 pounds (57.05 kg) BMI:     25.01 O2 Sat:      96 % on Room air Temp:     97.4 degrees F (36.33 degrees C) oral Pulse rate:   75 / minute Resp:     12 per minute BP sitting:   136 / 78  (left arm) Cuff size:   regular  Vitals Entered By: Burnard Leigh CMA(AAMA) (August 17, 2010 1:59 PM)  O2 Flow:  Room air CC: Yearly Wellness Exam/medicare..sls,cma Is Patient Diabetic? No Comments Pt needs B12 injection..Her pharmacy has been out and cannot get supply/sls,cma   Primary Care Provider:  Jacques Navy MD  CC:  Yearly Wellness Exam/medicare..sls and cma.  History of Present Illness: [name] [name] presents for a medicare wellness exam. Her main complaint is occasional eustachine tube dysfunction.  She requires assistance with bathing; she can dress herself, she has a good daughter who does most of the housework and cooking. She has had one fall. A home safety visit has been done. She does use a rolling walker at all times. Cognitively - she is alert and oriented, enjoys sitting and visitng. She does not have to worry about bills and check writing. She has a good attitude.   Per her daughter there is a lot less swelling, peripheral edema,  then in the past. She does have a leg lesion she is concerned about as well.   Preventive Screening-Counseling & Management  Alcohol-Tobacco     Alcohol drinks/day: 0     Smoking Status: never  Caffeine-Diet-Exercise     Caffeine use/day: 3 caffeinated beverages  daily     Diet Comments: healthy diet     Does Patient Exercise: no  Hep-HIV-STD-Contraception     Hepatitis Risk: no risk noted     HIV Risk: no risk noted     STD Risk: no risk noted     Dental Visit-last 6 months no     Sun Exposure-Excessive: yes  Safety-Violence-Falls     Seat Belt Use: yes     Helmet Use: n/a  Firearms in the Home: firearms in the home     Smoke Detectors: yes     Violence in the Home: no risk noted     Sexual Abuse: no      Drug Use:  never.        Blood Transfusions:  no.    Current Medications (verified): 1)  Cyanocobalamin 1000 Mcg/ml Soln (Cyanocobalamin) .... Inject Once Monthly 2)  Prilosec 20 Mg  Cpdr (Omeprazole) .... 2 Capsules Qam 3)  Clonidine Hcl 0.1 Mg  Tabs (Clonidine Hcl) .Marland Kitchen.. 1 Two Times A Day 4)  Remeron 30 Mg Tabs (Mirtazapine) .... Take 1 Tab By Mouth At Bedtime 5)  Ativan 0.5 Mg Tabs (Lorazepam) .Marland Kitchen.. 1 By Mouth Am, 2 Pm 6)  Diltiazem Hcl 120 Mg Tabs (Diltiazem Hcl) .Marland Kitchen.. 1 Once Daily 7)  Trazodone Hcl 50 Mg Tabs (Trazodone Hcl) .... 1/2 Tablet At Bedtime 8)  Trimethoprim 100 Mg Tabs (Trimethoprim) .Marland Kitchen.. 1 Once Daily 9)  Furosemide 40 Mg Tabs (Furosemide) .Marland Kitchen.. 1 By Mouth Once Daily 10)  Oxycodone-Acetaminophen 5-325 Mg Tabs (Oxycodone-Acetaminophen) .Marland Kitchen.. 1 By Mouth Q6 For Pain. Watch For Drowsiness and Constipation Fill On or After 07/04/10 11)  Klor-Con M20 20 Meq Cr-Tabs (  Potassium Chloride Crys Cr) .Marland Kitchen.. 1 Tablet By Mouth Once Daily 12)  Coricidin D Cold/flu/sinus 2-5-325 Mg Tabs (Chlorphen-Phenyleph-Apap) .... As Needed 13)  Albuterol Sulfate (2.5 Mg/71ml) 0.083% Nebu (Albuterol Sulfate) .Marland Kitchen.. 1 Nebulizer Treatment At Bedtime and Q6 As Needed 14)  Synthroid 25 Mcg Tabs (Levothyroxine Sodium) .Marland Kitchen.. 1 Tab Once Daily 15)  Gabapentin 100 Mg Caps (Gabapentin) .Marland Kitchen.. 1 At Bedtime  Allergies: 1)  ! Prednisone 2)  ! Zoloft  Past History:  Past Surgical History: Last updated: 02/14/2007 Hysterectomy Gall Bladder  Family History: Last updated: 06/13/2008 Non-contributory Family History of Breast Cancer: Family History of Heart Disease:   Past Medical History: HEALTH MAINTENANCE EXAM (ICD-V70.0) VITAMIN B12 DEFICIENCY (ICD-266.2) UNSPECIFIED HYPOTHYROIDISM (ICD-244.9) DIVERTICULITIS OF COLON (ICD-562.11) CHF, MILD (ICD-428.0) ESOPHAGEAL STRICTURE  (ICD-530.3) DUODENAL ULCER (ICD-532.90) FIBRILLATION, ATRIAL (ICD-427.31) PERIPHERAL EDEMA (ICD-782.3) DYSPHAGIA UNSPECIFIED (ICD-787.20) DEPRESSIVE TYPE PSYCHOSIS (ICD-298.0) SCIATICA (ICD-724.3) CONSTIPATION, CHRONIC (ICD-564.09) HYPERTENSION (ICD-401.9) Arthritis Gallstones  Social History: Widowed. Lives with daughter, dependent for ADL's. Home health coming in (9/09) Discussed code status: will not do CPR, or mechanical ventilation.  Patient has never smoked.  Alcohol Use - no Illicit Drug Use - no Caffeine use/day:  3 caffeinated beverages  daily Does Patient Exercise:  no Hepatitis Risk:  no risk noted HIV Risk:  no risk noted STD Risk:  no risk noted Dental Care w/in 6 mos.:  no Sun Exposure-Excessive:  yes Seat Belt Use:  yes Drug Use:  never Blood Transfusions:  no  Review of Systems       The patient complains of decreased hearing and dyspnea on exertion.  The patient denies anorexia, fever, weight loss, weight gain, vision loss, chest pain, syncope, peripheral edema, prolonged cough, hemoptysis, abdominal pain, hematochezia, hematuria, muscle weakness, transient blindness, difficulty walking, enlarged lymph nodes, and breast masses.    Physical Exam  General:  elderly white woman in no distress Head:  normocephalic and atraumatic.   Eyes:  vision grossly intact, pupils equal, and pupils round.   Ears:  R ear normal, L ear normal, and no external deformities.   Nose:  no external deformity and no external erythema.   Mouth:  edentulous, no oral lesions Neck:  supple and full ROM.   Lungs:  normal respiratory effort and normal breath sounds.   Heart:  normal rate and regular rhythm.   Abdomen:  soft, non-tender, and normal bowel sounds.   Msk:  no joint tenderness, no joint swelling, no joint warmth, and no joint instability.   Pulses:  2+ radial Extremities:  No clubbing, cyanosis, edema, or deformity noted with normal full range of motion of all joints.     Neurologic:  alert & oriented X3, cranial nerves II-XII intact, and gait normal.   Skin:  turgor normal, color normal, and no ulcerations.  SubQ hemorrhage distal left LE without warmth or tenderness Cervical Nodes:  no anterior cervical adenopathy and no posterior cervical adenopathy.   Psych:  Oriented X3, memory intact for recent and remote, normally interactive, and good eye contact.     Impression & Recommendations:  Problem # 1:  VITAMIN B12 DEFICIENCY (ICD-266.2) On replacement therapy. Labs ordered.  Orders: Vit B12 1000 mcg (J3420) Admin of Therapeutic Inj  intramuscular or subcutaneous (16109)  Addendum - MCV is in normal range indicating absence of B12 deficiency at this time.   Plan - will continue B12 replacement at this time.  Problem # 2:  UNSPECIFIED HYPOTHYROIDISM (ICD-244.9) Due for lab with recommendations to follow.  Her  updated medication list for this problem includes:    Synthroid 25 Mcg Tabs (Levothyroxine sodium) .Marland Kitchen... 1 tab once daily  Orders: TLB-TSH (Thyroid Stimulating Hormone) (84443-TSH) TLB-BNP (B-Natriuretic Peptide) (83880-BNPR)  Addendum - thyroid functions are normal. Plan - continue present regimen.  Problem # 3:  CHF, MILD (ICD-428.0) No clinical signs of decompensation.  Plan - BNP  Her updated medication list for this problem includes:    Furosemide 40 Mg Tabs (Furosemide) .Marland Kitchen... 1 by mouth once daily  Orders: TLB-BNP (B-Natriuretic Peptide) (83880-BNPR)  Addendum - mild elevation n BNP - will continue present medications at present dosing.  Problem # 4:  FIBRILLATION, ATRIAL (ICD-427.31) Doing well with no symptoms and rate controlled.  Plan- continue present medications.  Her updated medication list for this problem includes:    Diltiazem Hcl 120 Mg Tabs (Diltiazem hcl) .Marland Kitchen... 1 once daily  Problem # 5:  PERIPHERAL EDEMA (ICD-782.3) Wel controlled at this time on present dose of furosemide.  Her updated medication list  for this problem includes:    Furosemide 40 Mg Tabs (Furosemide) .Marland Kitchen... 1 by mouth once daily  Problem # 6:  DUODENAL ULCER (ICD-532.90) No signs or symptoms of active bleeding.   Plan - continue chronic PPI therapy.  Her updated medication list for this problem includes:    Prilosec 20 Mg Cpdr (Omeprazole) .Marland Kitchen... 2 capsules qam  Problem # 7:  DEPRESSIVE TYPE PSYCHOSIS (ICD-298.0) Very stable at this time. Her daughter provides a very caring and safe environment for her which is a big help in Orting her oriented and calm.   Problem # 8:  HYPERTENSION (ICD-401.9)  Her updated medication list for this problem includes:    Clonidine Hcl 0.1 Mg Tabs (Clonidine hcl) .Marland Kitchen... 1 two times a day    Diltiazem Hcl 120 Mg Tabs (Diltiazem hcl) .Marland Kitchen... 1 once daily    Furosemide 40 Mg Tabs (Furosemide) .Marland Kitchen... 1 by mouth once daily  Orders: TLB-BMP (Basic Metabolic Panel-BMET) (80048-METABOL)  BP today: 136/78 Prior BP: 142/62 (02/23/2010)  Prior 10 Yr Risk Heart Disease: Not enough information (04/14/2009)  Blood pressure is well controlled. Her Creatinine at 1.7 is improved over last reading.  Plan is to continue present medication.    Problem # 9:  Preventive Health Care (ICD-V70.0) Interval history is benign and limited exam is normal. Lab results are within normal limits. she is in pretty good condition for the condition she is in.   In summary - a delightful woman who is doing well. She will return in 4 months for routine follow-up.  Complete Medication List: 1)  Cyanocobalamin 1000 Mcg/ml Soln (Cyanocobalamin) .... Inject once monthly 2)  Prilosec 20 Mg Cpdr (Omeprazole) .... 2 capsules qam 3)  Clonidine Hcl 0.1 Mg Tabs (Clonidine hcl) .Marland Kitchen.. 1 two times a day 4)  Remeron 30 Mg Tabs (Mirtazapine) .... Take 1 tab by mouth at bedtime 5)  Ativan 0.5 Mg Tabs (Lorazepam) .Marland Kitchen.. 1 by mouth am, 2 pm 6)  Diltiazem Hcl 120 Mg Tabs (Diltiazem hcl) .Marland Kitchen.. 1 once daily 7)  Trazodone Hcl 50 Mg Tabs  (Trazodone hcl) .... 1/2 tablet at bedtime 8)  Trimethoprim 100 Mg Tabs (Trimethoprim) .Marland Kitchen.. 1 once daily 9)  Furosemide 40 Mg Tabs (Furosemide) .Marland Kitchen.. 1 by mouth once daily 10)  Oxycodone-acetaminophen 5-325 Mg Tabs (Oxycodone-acetaminophen) .Marland Kitchen.. 1 by mouth q6 for pain. watch for drowsiness and constipation fill on or after 07/04/10 11)  Klor-con M20 20 Meq Cr-tabs (Potassium chloride crys cr) .Marland Kitchen.. 1 tablet by  mouth once daily 12)  Coricidin D Cold/flu/sinus 2-5-325 Mg Tabs (Chlorphen-phenyleph-apap) .... As needed 13)  Albuterol Sulfate (2.5 Mg/80ml) 0.083% Nebu (Albuterol sulfate) .Marland Kitchen.. 1 nebulizer treatment at bedtime and q6 as needed 14)  Synthroid 25 Mcg Tabs (Levothyroxine sodium) .Marland Kitchen.. 1 tab once daily 15)  Gabapentin 100 Mg Caps (Gabapentin) .Marland Kitchen.. 1 at bedtime  Other Orders: Medicare -1st Annual Wellness Visit [phone])  Patient: [name] [name] Note: All result statuses are Final unless otherwise noted.  Tests: (1) BMP (METABOL)   Sodium                    139 mEq/L                   135-145   Potassium                 5.0 mEq/L                   3.5-5.1   Chloride                  103 mEq/L                   96-112   Carbon Dioxide            28 mEq/L                    19-32   Glucose                   93 mg/dL                    60-45   BUN                  [H]  25 mg/dL                    4-09   Creatinine           [H]  1.7 mg/dL                   8.1-1.9   Calcium                   9.4 mg/dL                   1.4-78.2   GFR                  [L]  29.67 mL/min                >60.00  Tests: (2) TSH (TSH)   FastTSH                   4.49 uIU/mL                 0.35-5.50  Tests: (3) B-Type Natiuretic Peptide (BNPR)  B-Type Natriuetic Peptide                        [H]  225.4 pg/mL                 0.0-100.0  Tests: (1) CBC with Diff (10010)   WBC                       6.7 K/uL  4.0-10.5   RBC                       4.22 MIL/uL                 3.87-5.11    Hemoglobin                13.3 g/dL                   16.1-09.6   Hematocrit                40.5 %                      36.0-46.0   MCV                       96.0 fL                     78.0-100.0 ! MCH                       31.5 pg                     26.0-34.0   MCHC                      32.8 g/dL                   04.5-40.9   RDW                       13.6 %                      11.5-15.5   Platelet Count            249 K/uL                    150-400   Granulocyte %             65 %                        43-77   Absolute Gran             4.4 K/uL                    1.7-7.7   Lymph %                   24 %                        12-46   Absolute Lymph            1.6 K/uL                    0.7-4.0   Mono %                    8 %                         3-12   Absolute Mono             0.6 K/uL  0.1-1.0   Eos %                     1 %                         0-5   Absolute Eos              0.1 K/uL                    0.0-0.7   Baso %                    1 %                         0-1   Absolute Baso             0.1 K/uL                    0.0-0.1  Medication Administration  Injection # 1:    Medication: Vit B12 1000 mcg    Diagnosis: VITAMIN B12 DEFICIENCY (ICD-266.2)    Route: IM    Site: LUOQ gluteus    Exp Date: 05/2012    Lot #: [id number]    Mfr: [employer]    Patient tolerated injection without complications    Given by: Burnard Leigh CMA(AAMA) (August 17, 2010 4:41 PM)  Orders Added: 1)  TLB-BMP (Basic Metabolic Panel-BMET) [80048-METABOL] 2)  TLB-TSH (Thyroid Stimulating Hormone) [84443-TSH] 3)  TLB-BNP (B-Natriuretic Peptide) [83880-BNPR] 4)  Vit B12 1000 mcg [J3420] 5)  Admin of Therapeutic Inj  intramuscular or subcutaneous [96372] 6)  Medicare -1st Annual Wellness Visit [G0438] 7)  Est. Patient Level IV [16109]

## 2010-09-19 ENCOUNTER — Other Ambulatory Visit: Payer: Self-pay | Admitting: Internal Medicine

## 2010-09-20 ENCOUNTER — Telehealth: Payer: Self-pay | Admitting: *Deleted

## 2010-09-20 MED ORDER — TRAZODONE HCL 50 MG PO TABS: 25.0000 mg | ORAL_TABLET | Freq: Every day | ORAL | Status: DC

## 2010-09-20 NOTE — Telephone Encounter (Signed)
Received fax from [facility] in [place]. Please Advise refill on Trazodone 50mg  tab.

## 2010-09-20 NOTE — Telephone Encounter (Signed)
Ok to refill trazodone prn

## 2010-09-25 ENCOUNTER — Other Ambulatory Visit: Payer: Self-pay | Admitting: Internal Medicine

## 2010-10-04 LAB — CBC
HCT: 31.4 % — ABNORMAL LOW (ref 36.0–46.0)
Hemoglobin: 10.7 g/dL — ABNORMAL LOW (ref 12.0–15.0)
Platelets: 202 10*3/uL (ref 150–400)
Platelets: 204 10*3/uL (ref 150–400)
WBC: 3.7 10*3/uL — ABNORMAL LOW (ref 4.0–10.5)
WBC: 3.9 10*3/uL — ABNORMAL LOW (ref 4.0–10.5)

## 2010-10-04 LAB — MAGNESIUM: Magnesium: 2.2 mg/dL (ref 1.5–2.5)

## 2010-10-04 LAB — BASIC METABOLIC PANEL
BUN: 16 mg/dL (ref 6–23)
BUN: 20 mg/dL (ref 6–23)
Calcium: 8.7 mg/dL (ref 8.4–10.5)
Creatinine, Ser: 1.89 mg/dL — ABNORMAL HIGH (ref 0.4–1.2)
GFR calc non Af Amer: 25 mL/min — ABNORMAL LOW (ref >60)
GFR calc non Af Amer: 28 mL/min — ABNORMAL LOW (ref >60)
Potassium: 4.4 mEq/L (ref 3.5–5.1)
Sodium: 141 mEq/L (ref 135–145)

## 2010-10-05 LAB — DIFFERENTIAL
Basophils Relative: 1 % (ref 0–1)
Eosinophils Absolute: 0.1 10*3/uL (ref 0.0–0.7)
Neutrophils Relative %: 66 % (ref 43–77)

## 2010-10-05 LAB — CBC
MCHC: 33.7 g/dL (ref 30.0–36.0)
MCV: 94.9 fL (ref 78.0–100.0)
Platelets: 297 10*3/uL (ref 150–400)

## 2010-10-05 LAB — SODIUM, URINE, RANDOM: Sodium, Ur: 100 mEq/L

## 2010-10-05 LAB — BASIC METABOLIC PANEL
Calcium: 9.6 mg/dL (ref 8.4–10.5)
Chloride: 105 mEq/L (ref 96–112)
Creatinine, Ser: 1.95 mg/dL — ABNORMAL HIGH (ref 0.4–1.2)
GFR calc Af Amer: 29 mL/min — ABNORMAL LOW (ref >60)
GFR calc non Af Amer: 24 mL/min — ABNORMAL LOW (ref >60)

## 2010-10-05 LAB — BRAIN NATRIURETIC PEPTIDE: Pro B Natriuretic peptide (BNP): 500 pg/mL — ABNORMAL HIGH (ref 0.0–100.0)

## 2010-10-05 LAB — POCT CARDIAC MARKERS: Troponin i, poc: <0.05 ng/mL (ref 0.00–0.09)

## 2010-10-05 LAB — PROTIME-INR
INR: 0.98 (ref 0.00–1.49)
Prothrombin Time: 12.9 seconds (ref 11.6–15.2)

## 2010-10-05 LAB — CREATININE, URINE, RANDOM: Creatinine, Urine: 32 mg/dL

## 2010-10-09 ENCOUNTER — Other Ambulatory Visit: Payer: Self-pay | Admitting: Internal Medicine

## 2010-10-19 ENCOUNTER — Other Ambulatory Visit: Payer: Self-pay | Admitting: *Deleted

## 2010-10-19 MED ORDER — TRAZODONE HCL 50 MG PO TABS: ORAL_TABLET | ORAL | Status: DC

## 2010-11-05 ENCOUNTER — Ambulatory Visit (INDEPENDENT_AMBULATORY_CARE_PROVIDER_SITE_OTHER): Payer: Medicare Other | Admitting: *Deleted

## 2010-11-05 DIAGNOSIS — Z111 Encounter for screening for respiratory tuberculosis: Secondary | ICD-10-CM

## 2010-11-07 ENCOUNTER — Other Ambulatory Visit: Payer: Self-pay | Admitting: Internal Medicine

## 2010-11-07 LAB — TB SKIN TEST: Induration: 0

## 2010-11-13 NOTE — Assessment & Plan Note (Signed)
[facility]                                 ON-CALL NOTE   NAME:Mednick, LAMIS BEHRMANN                  MRN:          [id number]  DATE:02/18/2008                            DOB:          07/09/1916    DATE OF INTERACTION:  February 18, 2008, at 7:38 p.m.   PHONE NUMBER:  [phone]   CALLER:  Steward Drone, the daughter.   The patient has been in the hospital and got out today.  Prescriptions  were written for her with many of her prescriptions changed.  She has a  prescription that was prescribed by Dr. Jeanie Sewer, written by the nurse  practitioner for the [facility] for Navane 3 mg 1 at  bedtime.  It does not comes in 3 mg tablet, it comes in 1, 2, 5 or 10.  I then filled 2 mg 1 tablet at night at [facility] at [phone]  and asked them to call Dr. Debby Bud' office tomorrow to straighten out  what dosage should actually be.  Primary care Saharah Sherrow is Dr. Debby Bud and  home office is [place].     Arta Silence, MD  Electronically Signed    RNS/MedQ  DD: 02/18/2008  DT: 02/19/2008  Job #: [id number]

## 2010-11-13 NOTE — Consult Note (Signed)
NAME:  [name] [name], [name] [name] NO.:  [id number]   MEDICAL RECORD NO.:  [id number]          PATIENT TYPE:  INP   LOCATION:  1311                         [facility]ACILITY:  [facility]   PHYSICIAN:  Antonietta Breach, M.D.  DATE OF BIRTH:  [birth year]   DATE OF CONSULTATION:  02/04/2008  DATE OF DISCHARGE:                                 CONSULTATION   Dictation ended at this point.      Antonietta Breach, M.D.  Electronically Signed     JW/MEDQ  D:  02/04/2008  T:  02/04/2008  Job:  [id number]

## 2010-11-13 NOTE — Discharge Summary (Signed)
NAME:  [name] [name], WOOLEN NO.:  [id number]   MEDICAL RECORD NO.:  [id number]          PATIENT TYPE:  INP   LOCATION:  5524                         FACILITY:  [facility]   PHYSICIAN:  Valerie A. Felicity Coyer, MDDATE OF BIRTH:  December 10, 1916   DATE OF ADMISSION:  03/08/2008  DATE OF DISCHARGE:  03/14/2008                               DISCHARGE SUMMARY   PRIMARY CARE PHYSICIAN:  [name] Gess. Norins, MD   DISCHARGE DIAGNOSES:  1. Moderate oropharyngeal dysphagia status post esophageal dilatation      this admission.  2. History of psychosis and depression.   HISTORY OF PRESENT ILLNESS:  [name] is an 77 year old white  female with recent admission in August 2009 with psychosis and  agitation, also with past medical history of frequent urinary tract  infections and hypertension.  The patient presented to the primary care  physician's office with daughter on day of admission with reports of  acute dysphagia.  Daughter reported difficulty swallowing foods or meds  with coughing and choking and throwing up.  Daughter also reported on  office evaluation that the patient with more severe tremors than normal  with worsening insomnia.  The patient was admitted from the office at  that time for further evaluation and treatment.  Gastroenterology was  asked to see the patient in consultation at the time of admission.   PAST MEDICAL HISTORY:  1. Depressive-type psychosis.  2. UTIs.  3. Sciatica.  4. Chronic constipation.  5. Hypertension.  6. Anxiety.  7. Status post hysterectomy.  8. Status post cholecystectomy.   COURSE OF HOSPITALIZATION:  1. Acute on chronic dysphagia.  Again, previous hospitalization and      evaluation by speech therapy have revealed moderate oropharyngeal      dysphagia.  The patient was seen in consultation by      Gastroenterology for symptoms of acute dysphagia.  The patient      underwent reevaluation by speech therapy in addition to GI eval.  EGD performed on March 08, 2008, revealed minimal stricture,      status post dilation during procedure.  The patient's dysphagia      thought to be neurogenic in nature and possibly exacerbated by      psychiatric medications.  The patient and daughter have been      instructed to continue with dysphagia II thin liquid diet per      speech therapy recommendations.  2. History of psychosis and depression with recent admission in August      2009 for same.  As mentioned in HPI, daughter is concerned about      increased tremors as well as increased insomnia prior to this      admission.  Dr. Antonietta Breach, Psychiatry, was asked to see the      patient in consultation for any med recommendations.  At this time,      the patient's Navane has been decreased to 1.5 mg at bedtime.      Please see complete consultation dictation for details.   MEDICATIONS:  At time of discharge:  1. Aspirin 325 mg p.o. daily.  2.  Remeron 30 mg p.o. at bedtime.  3. Lasix 20 mg p.o. daily.  4. Ativan 0.5 mg p.o. b.i.d.  5. Diltiazem 120 mg p.o. daily.  6. Potassium chloride 10 mEq p.o. daily.  7. Prilosec 20 mg p.o. daily.  8. Navane 2 mg p.o. at bedtime.  9. Clonidine 0.1 mg p.o. b.i.d.  10.MiraLax 17 g p.o. daily.   DISPOSITION:  The patient felt medically stable for discharge home at  this time.  The patient will be discharged home with her daughter where  she was living prior to this admission with 24-hour assistance.  Of  note, the patient is a limited resuscitation with no CPR and no  intubation.  The patient's daughter is instructed to have the patient  follow up with her primary care physician, Dr. Illene [name], in 1-2  weeks after discharge.      Cordelia Pen, NP      Raenette Rover. Felicity Coyer, MD  Electronically Signed    LE/MEDQ  D:  04/19/2008  T:  04/20/2008  Job:  [id number]   cc:   [name] Gess. Norins, MD

## 2010-11-13 NOTE — Consult Note (Signed)
NAME:  [name], [name] NO.:  [id number]   MEDICAL RECORD NO.:  [id number]          PATIENT TYPE:  INP   LOCATION:  1519                         FACILITY:  [facility]   PHYSICIAN:  Antonietta Breach, M.D.  DATE OF BIRTH:  1916/08/14   DATE OF CONSULTATION:  02/11/2008  DATE OF DISCHARGE:                                 CONSULTATION   REQUESTING PHYSICIAN:  Rene Paci, MD.   REASON FOR CONSULTATION:  Psychosis.   HISTORY OF PRESENT ILLNESS:  [name] [name] is an 77 year old female  admitted to the [facility] on February 10, 2008 due to acute  mental status changes, failure to thrive, and hypokalemia.   [name] [name]'s daughter helped with the history.  Since leaving the  hospital the last time [name] [name] has continued to go in and out of  delusional.  She continues to have delusions that she has been lying and  that she is not in good standing with God.  She will report to her  daughter that she has lied about a number of events which were actually  true and part of her normal history.  During some of these episodes she  will become mute and will be very irritable, which is uncharacteristic  for the patient.  She will also turn off the TV and become withdrawn.   Zoloft was started a few days before admission and it was associated  with an exacerbation of the patient's irritability.  She was severely  agitated and it required 4 people to get her blood pressure when  emergency medical services came to her attention.   PAST PSYCHIATRIC HISTORY:  [name] [name] does have a history of severe  major depressive episodes in the past.  She also has a history of  psychosis.  She required admission to [facility]  during her 46s.  She did not have electroconvulsive therapy.   FAMILY PSYCHIATRIC HISTORY:  [name] [name]'s mother spent her last  years in a mental institution.   SOCIAL HISTORY:  [name] [name] is widowed.  She does not  use any  alcohol or illegal drugs.  Her daughter is very attentive.  Mrs.  [name] lives in the [facility]  She has a sister who lives in  assisted living in [place].  They communicate regularly.   PAST MEDICAL HISTORY:  Hypertension.   MEDICATIONS:  The MAR is reviewed.  [name] [name] is on Remeron 15 mg  q.h.s. for anti depression, Xanax 0.25 mg q.6 h p.r.n.   ALLERGIES:  She has no known drug allergies.   LABORATORY DATA:  WBC 6.8, hemoglobin 10.4, platelet count 332.  Sodium  140, potassium 4.1, BUN 19, creatinine 0.92, glucose 119.  SGOT 12, SGPT  12.   Urinalysis and INR unremarkable.   EKG:  QTC 429 milliseconds.   Earlier this month her B12 and TSH were normal.   REVIEW OF SYSTEMS:  Constitutional, head, eyes, ears, nose, throat,  mouth, neurologic, psychiatric, cardiovascular, respiratory,  gastrointestinal, genitourinary, skin, musculoskeletal, hematologic,  lymphatic, endocrine, metabolic all unremarkable.   EXAMINATION:  VITAL SIGNS:  Temperature 97.5, pulse 69,  respiratory rate  20, blood pressure 118/67, O2 saturation on room air 99%.  GENERAL APPEARANCE:  [name] [name] is an elderly female lying in a  supine position in her hospital bed with no abnormal involuntary  movements.   MENTAL STATUS EXAM:  [name] [name] is alert.  Her eye contact is good.  On orientation testing she does not get the year correct, however, she  is correct with the month, days of the month, place and person.  On  memory testing she names 3/3 immediate and 3/3 at 5 minutes.  Her fund  of knowledge and intelligence are grossly within normal limits.  Her  affect is flat.  Her mood is anxious.  Speech is soft but involves no  dysarthria.  The prosody is mildly flat.   Thought process is logical, coherent, goal-directed.  No looseness of  associations.  Thought content:  She does have delusions about telling  lies in the past.  She has concern about her standing with God   based  upon this.  Her judgment is impaired.  Her insight is poor.   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  History of major depressive disorder, however, in this current context  it does not appear that the psychosis is secondary to depression because  the patient does have normal interests in between the exacerbations of  psychosis.   The indications, alternatives and adverse effects of Navane were  discussed with the patient's daughter.  She understands and would like  to start Navane.   Would start Navane at  mg p.o. daily and would titrate by 1 mg per day  as tolerated to an initial trial dose of 3 mg p.o. q. supper.   Low stimulation ego support to facilitate a therapeutic alliance.   [name] [name] would be appropriate for a geropsychiatric unit.   However again, if she remains noncombative and cooperative, she could go  back to an assisted living setting as long as she continued to take her  antipsychotic.      Antonietta Breach, M.D.  Electronically Signed     JW/MEDQ  D:  02/11/2008  T:  02/11/2008  Job:  [id number]

## 2010-11-13 NOTE — Consult Note (Signed)
NAME:  [name] [name], [name] [name] NO.:  [id number]   MEDICAL RECORD NO.:  [id number]          PATIENT TYPE:  INP   LOCATION:  1311                         FACILITY:  [facility]   PHYSICIAN:  Antonietta Breach, M.D.  DATE OF BIRTH:  June 19, 1917   DATE OF CONSULTATION:  02/04/2008  DATE OF DISCHARGE:                                 CONSULTATION   REQUESTING PHYSICIAN:  Rosalyn Gess. Norins, MD   REASON FOR CONSULTATION:  Acute mental status changes.   HISTORY OF PRESENT ILLNESS:  [name] [name] is an 77 year old  female admitted to the [facility] on February 01, 2008, with  fever and confusion.   [name] [name] has displayed approximately 1 week of deterioration in  memory, reasoning and judgment.  She has had some agitation.  She is not  currently having any delusions or hallucinations other than those that  represent confusion.  She is cooperative with bedside care and is  sitting up in her hospital chair with her daughter visiting.   [name] [name] has had recurrent urinary tract infections.  However, her  urinalysis at this time has relatively minimal findings.  She did have a  fever upon admission.  She recently has undergone a prednisone taper  which finished just prior to admission.  She did have some mild  decreased sodium at 126 today.  She has been taking Pamelor 50 mg daily  at bedtime now reduced to 25 mg daily at bedtime.   PAST PSYCHIATRIC HISTORY:  [name] [name] does have a history of  depression and anxiety in review of the past medical record.  These were  both listed in April of 2008.  At that time she was on Xanax 0.5 mg q.6  hours.  She was also on Pamelor 50 mg daily at bedtime at that time.   FAMILY PSYCHIATRIC HISTORY:  None known.   SOCIAL HISTORY:  [name] [name] is widowed.  She lives in [facility] [employer]  She has a sister who calls her regularly.  Her sister lives in an  assisted living facility in [place].  [name] [name] has a daughter  looking in on her regularly today.  No alcohol or illegal drugs.   PAST MEDICAL HISTORY:  Hyponatremia, hypertension.  History of  hysterectomy, cholecystectomy and frequent urinary tract infections.   MEDICATIONS:  MAR is reviewed.  1. The patient is on Remeron 15 mg daily at bedtime.  2. Pamelor 25 mg daily at bedtime.  3. Xanax 0.125 mg q.6 hours p.r.n. and 0.5 mg daily at bedtime p.r.n.   ALLERGIES:  She has no known drug allergies.   LABORATORY DATA:  Sodium 126, potassium 3.7, BUN 13, creatinine 1.01,  glucose 125, SGOT 21, SGPT 23.  Head CT without contrast unremarkable.  WBC 11.9, hemoglobin 11.7, platelet count 337.  Urinalysis does show  slight increase in WBCs.  B12 and TSH within normal limits.   REVIEW OF SYSTEMS:  Constitutional, head, eyes, ears, nose, throat,  mouth, neurologic, psychiatric, cardiovascular, respiratory,  gastrointestinal, genitourinary, skin, musculoskeletal, hematologic,  lymphatic, endocrine, metabolic all unremarkable.   PHYSICAL EXAMINATION:  VITAL SIGNS:  Temperature  97.4, pulse 94,  respiratory rate 20, blood pressure 139/82, O2 saturation on room air  97%.  MENTAL STATUS EXAM:  [name] [name] is alert.  Her eye contact is good.  Her attention span is mildly decreased.  On orientation testing she does  not know the year or the place.  She does believe that she is in her  residence, the [facility] [employer]  She also states that her daughter is her  mother.  Memory function 3/3 immediate, 1/3 at 3 minutes.  Affect is  mildly flat, mood is within normal limits.  Fund of knowledge and  intelligence are below that of her estimated premorbid baseline.  Concentration is mildly decreased.  Speech is soft with normal rate and  prosody.  There is no dysarthria.  Thought process partial  confabulation.  Thought content, no thoughts of harming herself, no  thoughts of harming others, no delusions, no hallucinations.  Insight is  poor.  Judgment is  impaired.   ASSESSMENT:  AXIS I:  293.00, delirium not otherwise specified.  293.84,  anxiety disorder not otherwise specified.  AXIS II:  None.  AXIS III:  See past medical history.  AXIS IV:  General medical.  AXIS V:  40.   [name] [name] delirium may be due to a synergism of multiple mild  factors including fever, a recent prednisone taper, the anticholinergic  effect of Pamelor, a decrease in sodium all impacting an older  cholinergic deficient brain.   RECOMMENDATIONS:  Concur with the reduction of Pamelor and would try  tapering it off completely; not only could this help acutely but could  increase her delirium threshold over the long run.  No change in Remeron  15 mg daily at bedtime.  No change in Xanax at this point, however, if  she does persist with delirium symptoms would begin with a reduction in  Xanax followed by a discontinuation of Remeron.  Then would have to  reevaluate necessary psychotropics for anxiety and depression  prevention.      Antonietta Breach, M.D.  Electronically Signed     JW/MEDQ  D:  02/04/2008  T:  02/04/2008  Job:  [id number]

## 2010-11-13 NOTE — Consult Note (Signed)
NAME:  [name] [name], [name] [name] NO.:  [id number]   MEDICAL RECORD NO.:  [id number]          [name] TYPE:  INP   LOCATION:  1532                         FACILITY:  [facility]   PHYSICIAN:  Antonietta Breach, M.D.  DATE OF BIRTH:  January 27, 1917   DATE OF CONSULTATION:  03/21/2008  DATE OF DISCHARGE:                                 CONSULTATION   REASON FOR CONSULTATION:  Severe agitation after sundown without  psychosis.   HISTORY OF PRESENT ILLNESS:  [name] [name] is an 77 year old  female admitted to [name] on March 18, 2008 due to  dehydration.  [name] [name] has been experiencing worsening severe  agitation that comes on in [name] latter part of [name] day for approximately  4 days.  She will call her [name] often during [name] night.  She does  not lose her memory or orientation abilities at that time.  Also she has  not had a return of hallucinations, mutism or delusions.  She does  continue to have episodic esophageal dysmotility.  These periods have  come and gone.  Currently, she has undergone a new episode of esophageal  dysmotility and workup.  Her mood is within normal limits.  She is  continuing to display normal memory overall as well as stable  orientation.   PAST PSYCHIATRIC HISTORY:  [name] [name] does have a history of severe  recurrent major depression.  She initially developed those periods in  her 38s.  [name] type of treatment at that time is not known.  However, she  did require admission to a psychiatric hospital.  In review of [name] past  medical record, [name] [name]'s [name] did state that she had a history  of psychosis prior to this year, however this hospitalization, Mrs.  [name] [name] [name] states that [name] [name] did not experience  psychosis until this past summer.   [name] psychosis involved periods of mutism along with turning off [name] TV  and wanting to be alone.  She would also be very irritable during these.  She  would believe that she had lost her religious standing with God.  [name] [name] would also states that she was guilty of many lies when in  actuality she was describing events that had occurred.  [name] [name]  was treated with Navane starting in early August.  [name] Navane was  titrated to 3 mg q.h.s.  Her psychotic symptoms resolved.  Her current  dosage is at 2 mg q.h.s.   FAMILY PSYCHIATRIC HISTORY:  [name] [name] did have mental  illness in her latter years and required a stay in a mental institution.   SOCIAL HISTORY:  [name] [name] has a sister who lives in [name] area.  She also has her [name] who attends to her regularly.  [name] [name]  is widowed.  She is retired.  She does not have any history of alcohol  or illegal drugs.  She has been staying at [name] [facility]   PAST MEDICAL HISTORY:  1. Hypertension.  2. Urinary tract infections.  3. Regarding [name] dysphagia, she did have esophageal dilatation last  month.   MEDICATIONS:  MAR is reviewed.  Currently her psychotropic medication  includes:  1. Atacand 0.5 mg b.i.d.  2. Remeron 45 mg q.h.s.  3. Navane 2 mg q.h.s.   ALLERGIES:  1. ZOLOFT.  2. PREDNISONE.   LABORATORY DATA:  Sodium 145, BUN 15, creatinine 0.93, glucose 103, WBC  4.8, hemoglobin 11.3, platelet count 268.  [name] [name] did have elevated  WBCs in her urine and is on Levaquin.   REVIEW OF SYSTEMS:  Constitutional, head, eyes, ears, nose, throat,  mouth, neurologic, psychiatric cardiovascular, respiratory,  gastrointestinal, genitourinary, skin, musculoskeletal, hematologic,  lymphatic, endocrine, metabolic all unremarkable.   PHYSICAL EXAMINATION:  VITAL SIGNS:  Temperature 98.6, pulse 79,  respiratory rate 16, blood pressure 158/71, O2 saturation on room air  98%.  GENERAL APPEARANCE:  [name] [name] is an elderly female sitting up in  her hospital chair with no abnormal involuntary movements.  (Of note [name]  parkinsonian-like  tremor that was seen on previous exam has now  resolved).   MENTAL STATUS EXAM:  [name] [name] is alert.  Her eye contact is good.  She is oriented to all spheres except that she states that [name] year is  18 and that it is September 22 instead of [name] 21st.  Her memory  function is intact to immediate, recent and remote.  Her attention span  is slightly decreased.  Concentration is slightly decreased.  Affect is  slightly anxious at baseline, but with a broad appropriate range.  Mood  is slightly anxious.  Her fund of knowledge and intelligence are grossly  within normal limits.  Her speech involves normal rate and prosody.  She  has slight dysarthria with her dentures out.  Thought process is  logical, coherent, goal-directed.  No looseness of associations.  Thought content, no thoughts of harming herself, no thoughts of harming  others, no delusions, no hallucinations.  Insight is intact.  Judgment  is grossly intact.   ASSESSMENT:  AXIS I:  293.81.  Psychotic disorder not otherwise  specified, currently resolved.  [name] [name]'s current history that she  provides does bring up [name] possibility that her psychosis was  independent of depression and possibly has only occurred this year  correlating with her advancing age and recurrent urinary tract  infections as well as other general medical factors.  293.83.  Mood  disorder not otherwise specified.  [name] [name] does appear to have  increased agitation as day fatigue progresses.  This is relatively new  pattern.  She has a sundowning pattern other than she does not lose  orientation and memory function according to [name] [name].  296.35.  Major depressive disorder, currently stable.  AXIS II:  None.  AXIS III:  Urinary tract infection.  History of hypertension and  dysphagia.  AXIS IV:  General medical.  AXIS V:  55.   [name] undersigned provided ego supportive therapy as well as extensive  education with [name] [name]'s [name].   [name] [name]'s [name] and [name]  [name] also concurs with [name] plan below.  [name] indications, alternatives  and adverse effects of trazodone were discussed.  [name] [name]'s [name]  and [name] [name] want to proceed with trazodone.   RECOMMENDATIONS:  1. Would discontinue Navane to rule out episodic dysmotility that is      secondary to Navane analogous to episodic dystonia.  If [name]      psychosis returns, could check an EKG, QTC and start Seroquel if      [name] QTC is  less than 500 milliseconds.  2. Trazodone starting at 12.5 mg p.o. q. 1800 hours for [name] evening      insomnia and mild agitation.  3. Would decrease Remeron back to 30 mg daily for antidepression.  4. Ego supportive psychotherapy.  5. Would monitor for excessive a.m. sedation from [name] trazodone.      Antonietta Breach, M.D.  Electronically Signed     JW/MEDQ  D:  03/21/2008  T:  03/21/2008  Job:  [id number]

## 2010-11-13 NOTE — Discharge Summary (Signed)
NAME:  [name] [name], [name] [name] NO.:  [id number]   MEDICAL RECORD NO.:  [id number]          PATIENT TYPE:  INP   LOCATION:  1311                         FACILITY:  [facility]   PHYSICIAN:  Rosalyn Gess. Norins, MD  DATE OF BIRTH:  June 01, 1917   DATE OF ADMISSION:  02/01/2008  DATE OF DISCHARGE:  02/05/2008                               DISCHARGE SUMMARY   ADMITTING DIAGNOSIS:  1. Confusion with disorientation.  2. Sciatica.  3. Hypertension.  4. Urinary tract infection.   DISCHARGE DIAGNOSES:  1. Confusion with disorientation.  2. Sciatica.  3. Hypertension.  4. Urinary tract infection.   CONSULTANTS:  Dr. Antonietta Breach, psychiatry.   PROCEDURES:  1. CT scan of the brain without contrast which was a negative      examination except for normal atrophy.  2. Chest x-ray, 2 views, performed August 4 which showed no active      disease.   HISTORY OF PRESENT ILLNESS:  Ms. Swartz is a delightful 77 soon to  be 77 year old woman who had been followed in the office for significant  sciatica.  She did put on a prednisone bursting taper and was to be  scheduled to see [facility] for possible epidural steroid  injection therapy.   Subsequent to being started on steroids, the patient started to develop  problems with confusion and mental status changes.  She had a definite  personality change.  In addition, the patient is having significant  bumps in blood pressure.  Because of her persistent and worsening  symptoms she was brought to the office on the day of admission and was  subsequently admitted with possible UTI.  Please see admission note for  past medical history, family history, social history and physical exam  at admission.   HOSPITAL COURSE:  1. Mental status changes.  The patient was found to have a very      minimal urinary tract infection.  She was found to be hyponatremic      with sodium of 125.  The patient had additional lab work during  this stay including a TSH which was normal at 4.09.  B12 level was      checked and normal at 1205.  The patient continued to have      disorientation and confusion.  She was continued on Xanax.  She was      started on Remeron which did help her sleep.  The patient was seen      in consultation by Dr. Jeanie Sewer for psychiatry.  It was felt that      she had multifactorial delirium possibly related to mild UTI and      steroids and age and hyponatremia.  His recommendation was to      discontinue nortriptyline.  He concurred with the use Remeron and      continued use of Xanax.  The patient continued to improve.  On the      day of discharge she was more oriented.  She had rested well and      seemed to be back at her baseline.  2. Urinary  tract infection.  The patient had very minimal urinary      tract infection with 7-10 WBCs per high-powered field and few      bacteria.  The patient was started on Ceftin.  She seemed to do      well on this and will continue, actually, has completed a 5-day      course of antibiotics and will not need additional treatment.  3. Sciatica.  The patient has had a problem with significant back      pain.  The steroid did, in fact, help quite a bit to relieve her      discomfort.  She was continued on Tylenol and there was      propoxyphene 65 mg q.4 h. on a p.r.n. basis.  4. Hyponatremia.  The patient was treated with normal saline and      furosemide.  Her sodium was slow to respond.  Her sodium was 125 at      admission and on August 6 her sodium was 126.  This may be a recent      set osmostat.  At this point, I do not believe she needs to have      continued IV fluids.  She will be followed as an outpatient with      followup basic metabolic panel at her next office visit in 7-10      days.  5. Hypertension.  The patient has been continued on her home      medications.  She also has been given clonidine p.r.n. which she      has required on a regular  basis.  At time of discharge she will      have clonidine 0.1 mg b.i.d. on a scheduled basis with p.r.n. doses      as needed.   With the patient being close to her baseline mentally with no active  medical problems, it is felt that the patient is stable for discharge.  She will not return to dependent living, but will be spending several  days with her daughter.  She will be seen in the office in followup in 7-  10 days.   DISCHARGE EXAMINATION:  VITAL SIGNS:  Temperature was 98.9, blood  pressure 163/72, pulse was 93, respirations were 18, O2 sats 99% on room  air.  GENERAL APPEARANCE:  This is an elderly woman sitting on the side of the  bed eating her breakfast.  HEENT: Exam was unremarkable.  CHEST:  Chest was clear.  CARDIOVASCULAR:  Revealed a regular rate, mildly tachycardiac.  NEUROLOGICAL:  Neurologically the patient is oriented to person, place  and context, her speech is clear.  She does not seem to be withdrawn.  DERM:  The patient has a mild flare of a rash across the malar aspect of  her face.   DISCHARGE MEDICATIONS:  The patient will resume her home medications  including:  1. Aspirin 325 mg daily.  2. Alprazolam 0.25 mg t.i.d.  3. Prilosec 20 mg daily.  4. Potassium 20 mEq daily.  5. Will discontinue triamterene hydrochlorothiazide, substitute Lasix.  6. Prednisone is discontinued and completed.  7. Oxycodone is discontinued.  8. Clonidine 0.1 mg b.i.d.  9. Remeron 15 mg q.h.s. will be added to her regimen.   The patient's condition at time of discharge dictation is stable and  improved.      Rosalyn Gess Norins, MD  Electronically Signed     MEN/MEDQ  D:  02/05/2008  T:  02/05/2008  Job:  [id number]

## 2010-11-13 NOTE — Discharge Summary (Signed)
NAME:  [name] [name], [name] [name] NO.:  [id number]   MEDICAL RECORD NO.:  [id number]          PATIENT TYPE:  INP   LOCATION:  1519                         FACILITY:  [facility]   PHYSICIAN:  [name] [name], MDDATE OF BIRTH:  [birth year]   DATE OF ADMISSION:  02/10/2008  DATE OF DISCHARGE:  02/16/2008                               DISCHARGE SUMMARY   DISCHARGE DIAGNOSES:  1. Acute psychosis with history of depression.  2. Urinary tract infection with positive urinalysis on admit.   HISTORY OF PRESENT ILLNESS:  [name] [name] is an 77 year old white  female with past medical history of depression with recent psychotic  episodes, also with history of recurrent urinary tract infections and  hypertension.  The patient presented to [facility] Emergency Room on  day of admission with mental status decline per her daughter.  Of note,  the patient discharged from our hospital service on February 05, 2008 after  treatment for acute delirium, which was attributed to prednisone that  had recently been started and also due to slight hyponatremia.  Upon ER  evaluation this particular admission daughter reported mental decline at  home with anorexia and refusal to take medications.  Daughter also  reports patient was acting very anxious and agitated.   REVIEW OF SYSTEMS:  The patient exhibited delusional behavior, feeling  she was not in good standing with God and continuing to report to  daughter that she had lied about a number of events.  Also of note, the  patient was started on Zoloft prior to this admission which seemed to be  associated with exacerbation of the patient's irritability.   PAST MEDICAL HISTORY:  1. Severe major depressive disorder with history of psychosis,      admission to [facility] when patient was in her 60s.  2. Hypertension.  3. History of sciatica.  4. History of recurrent UTIs.   CONSULTATIONS DURING THIS ADMISSION:  Antonietta Breach, Psychiatrist.   COURSE OF HOSPITALIZATION:  1. Acute psychosis in setting of major depressive disorder.  As      mentioned in HPI, the patient with recent admission for same at      which time prednisone was discontinued and mild hyponatremia      treated.  Psychiatry was asked to see the patient in consultation      upon this admission who recommended starting Navane and titrating      to dose of 3 mg per day which has occurred at time of dictation.      Psychiatry also felt patient would benefit from short stay in      Geropsychiatric Unit at [facility].  The patient has      remained noncombative and cooperative throughout hospitalization      with no delusional behavior at time of dictation.  The patient to      remain off Zoloft indefinitely as mentioned above, this seemed to      increase her agitation.  Also to remain off prednisone.  2. UTI with positive urinalysis on admit.  Urine culture was obtained      which was negative for  any growth.  The patient was treated      empirically with 5 days of IV Rocephin.   MEDICATIONS AT TIME OF DISCHARGE:  1. Prilosec 20 mg p.o. daily.  2. Aspirin 325 mg p.o. daily.  3. Clonidine 0.1 mg p.o. b.i.d.  4. Remeron 30 mg p.o. nightly.  5. Navane 3 mg p.o. nightly.  6. MiraLax 17 grams powder p.o. daily, hold for diarrhea.  7. Nystatin powder applied to inner thigh area t.i.d. until resolution      of rash.  8. Ativan 0.5 mg p.o. q.6 hours p.r.n.  9. Tylenol 650 mg p.o. q.4 hours p.r.n.  10.Xanax 0.25 mg p.o. q.6 hours p.r.n.  11.Lasix 20 mg p.o. daily.  12.Potassium chloride 10 mEq p.o. daily.  13.Patient instructed to discontinue Zoloft.   PHYSICAL EXAM AT TIME OF DISCHARGE:  Blood pressure 165/74, heart rate  95, respirations 20, temperature 98.2, O2 sat is 94% on room air.  GENERAL:  This is an elderly white female with flat affect, awake and  alert, in no acute distress.  HEAD:  Normocephalic/atraumatic.  EYES:  Pupils are equal, round,  reactive to light.  No scleral icterus  or injection.  EAR, NOSE AND THROAT:  Mucous membranes are moist.  The patient with  normal hearing.  NECK:  Thin, supple, no thyromegaly or lymphadenopathy.  CHEST:  With symmetrical movement, nontender to palpation.  CARDIOVASCULAR:  S1-S2, regular rate and rhythm, no lower extremity  edema.  No appreciated murmurs.  RESPIRATORY:  The patient with decreased inspiratory effort but lung  sounds are clear to auscultation bilaterally.  No wheezes, rales or  crackles.  GI: Abdomen is soft, nontender, nondistended, with positive bowel  sounds, no appreciated masses or hepatosplenomegaly.  NEUROLOGICALLY:  Patient is alert and oriented to name and place; the  year is incorrect, however she is correct with the month.  The patient  without any focal motor sensory deficits on exam.  PSYCH:  The patient again alert, oriented to name and place.  The  patient does have flat affect but with seemingly logical and coherent  thought process at time of dictation.   DISPOSITION:  The patient felt medically stable for transfer to  Geropsychiatric Unit at [facility] at this time.  Hopefully  patient will undergo short stay in hopes of returning home to  independent living facility where she was living prior to this  admission.  At time of dictation awaiting acceptance to [facility]. Greater than 30 minutes spent on discharge planning.      [name] Pen, NP      [name] Rover. [name] Coyer, MD  Electronically Signed    LE/MEDQ  D:  02/16/2008  T:  02/16/2008  Job:  [id number]   cc:   [name] Gess. Norins, MD  [address]  [place]  [place] [address]

## 2010-11-13 NOTE — Assessment & Plan Note (Signed)
[name]                                 ON-CALL NOTE   NAME:[name], [name] KASSA                  MRN:          [id number]  DATE:03/18/2008                            DOB:          December 28, 1916    TIME:  6:53 p.m.   PHONE NUMBER:  [phone].   REGULAR DOCTOR:  Dr. Rosalyn Gess. Norins.  Dr. Idamae Schuller A. Tower.   CALLER:  [name].   CHIEF COMPLAINT:  Shortness of breath.   I spoke to the grand daughter and she said she has been hospitalized 3  times since August.  She had a congestion and swallowing problems at her  last hospitalization.  She was discharged this past Monday and was  worried about her aspirating.  She is 31-1/75 years old.  They did a few  med changes and ordered her thickened liquids and pureed foods at that  time.  She is worried that she may have aspirated today.  She was very  raspy this morning.  She has become worse and worse.  She can feel  congestion in her lungs and is trying to cough out clear phlegm.  Currently, she is starting to get distressed and gasps for air.  We  instructed the caller to go ahead and call 911 to get an ambulance and  transport the patient to the hospital for evaluation and this is what  she is going to do.     Marne A. Tower, MD  Electronically Signed    MAT/MedQ  DD: 03/18/2008  DT: 03/19/2008  Job #: [id number]

## 2010-11-13 NOTE — Discharge Summary (Signed)
NAME:  [name] [name], [name] [name] NO.:  [id number]   MEDICAL RECORD NO.:  [id number]          PATIENT TYPE:  INP   LOCATION:  1532                         FACILITY:  [facility]   PHYSICIAN:  Raenette Rover. Felicity Coyer, MDDATE OF BIRTH:  1917-04-20   DATE OF ADMISSION:  03/18/2008  DATE OF DISCHARGE:  03/28/2008                               DISCHARGE SUMMARY   PRIMARY CARE PHYSICIAN:  Dr. [name].   DISCHARGE DIAGNOSES:  1. Severe oropharyngeal dysphagia status post PEG tube placement on      March 24, 2008.  2. E.  Coli UTI.  3. Acute renal insufficiency on admit secondary to dehydration and      UTI.  4. Anxiety and confusion.  5. Chronic debilitation.   HISTORY OF PRESENT ILLNESS:  Ms. Mowbray is a an 77 year old white  female with past medical history of major depressive disorder,  hypertension and frequent urinary tract infections.  Also with history  of oropharyngeal dysphagia assessed at prior hospitalizations.  Of note,  the patient has been hospitalized several times over the past few months  with recurrent urinary tract infections and psychosis.  The patient has  also been worked up during these hospitalizations for progressive  dysphagia.  Upon admission on March 18, 2008, the patient presented  with her family to [facility] emergency room with reports of increased  choking when eating with progressive gurgling in chest with mildly  productive cough.  Daughter reported the patient's symptoms had become  progressively worse with eating; therefore, they brought the patient to  the hospital for further evaluation and treatment.  Upon evaluation in  the ER, urinalysis revealed 11-20 WBCs.  Chest x-ray with mild  bronchitic changes with no active disease.  The patient also presented  with a BUN of 40 and an elevated creatinine from week prior at 1.4.  The  patient was admitted at time for further evaluation and treatment.   PAST MEDICAL HISTORY:  1.  History of major depressive disorder with acute psychosis.  2. Hypertension.  3. Sciatica.  4. Frequent year recurrent urinary tract infection.  5. Oropharyngeal dysphagia status post esophageal dilatation on      March 08, 2008.   CONSULTATIONS:  During this admission.  1. [facility].  2. Speech language pathology.   COURSE HOSPITALIZATION:  1. Progressive oropharyngeal dysphagia.  The 77 year old white  female withemergency room for further evaluation and treatment and placed on      strict n.p.o.'s due to high risk for aspiration pneumonia.  Speech      therapy was asked to see the patient in consultation at which time      a modified barium swallow study was completed revealing severe      oropharyngeal dysphagia.  There was a question of whether or not      the patient's progressive dysphagia is secondary to medication      effect with a neurogenic etiology.  GI was asked to see the patient      in consultation who felt the patient was appropriate for PEG tube  placement of which the patient underwent on March 24, 2008.  At      time of dictation, patient receiving bolus feedings with free water      flushes.  The patient is tolerating PEG tube well.  The daughter is      in complete understanding of aspiration precautions.  The patient      will be discharged home with home health nurse as well as continued      speech therapy.  Also of note, the patient instructed by GI to stay      on proton pump inhibitor daily in setting of duodenal ulcer.  2. Acute renal insufficiency at time of admission.  Again, urine      culture obtained at time of admission due to positive urinalysis      for infection.  Urine culture revealed greater than 100,000      colonies of E-coli sensitive to Levaquin.  At time of dictation,      the patient treated with total of 10 days of Levaquin to be      discontinued at time of discharge.  Due to the patient's recurrent       urinary tract infections, the patient will be DC home on      prophylactic dose of Septra DS to be taken daily.  3. History of anxiety/depression with recent history of psychosis.      The patient seen in consultation by Dr. Antonietta Breach.  During      this admission, the patient was taken off Navane in setting of      problem number #1.  The patient to be DC home on continued Remeron      and trazodone.   MEDICATIONS:  At time of discharge.  1. Clonidine 0.1 mg per PEG b.i.d.  2. Diltiazem 120 mg per PEG daily.  3. Aspirin 325 mg per PEG daily.  4. Ativan 0.5 mg per PEG b.i.d.  5. K-Dur 20 mEq of packed daily  6. Remeron 30 mg per PEG at 6:00 p.m.  Note, this is a new dose.  7. Trazodone 25 mg tab 1/2 tablet at 6:00 p.m., note this is a new      medication.  8. Ativan 1 mg q.h.s. p.r.n. sleep.  9. Free water flushes 100 mL before and after each feeding.  10.Jevity 1.2 200 mL q.4 h.  11.Nystatin cream p.r.n.  12.Septra DS one tablet daily.  13.Protonix 40 mg daily.   LABORATORY DATA:  Pertinent lab work at time of discharge, white cell  count 4.8, platelet count 268, hemoglobin 11.3, hematocrit 33.8.  Sodium  145, potassium 3.6, BUN 15, creatinine 0.93, H pylori test negative.   DISPOSITION:  The patient felt medically stable for discharge home at  this time.  The patient can follow up with her primary care physician in  approximately 2 weeks after discharge for follow-up on electrolytes.  Daughter requested she make this appointment herself at discharge.   Greater than 30 minutes spent on discharge planning.      Cordelia Pen, NP      Raenette Rover. Felicity Coyer, MD  Electronically Signed    LE/MEDQ  D:  03/28/2008  T:  03/28/2008  Job:  [id number]   cc:   Rosalyn Gess. Norins, MD  [address]  [place]  [place] [address]

## 2010-11-13 NOTE — Consult Note (Signed)
NAME:  KEAH, LAMBA NO.:  [id number]   MEDICAL RECORD NO.:  [id number]          PATIENT TYPE:  INP   LOCATION:  5524                         FACILITY:  [facility]   PHYSICIAN:  Antonietta Breach, M.D.  DATE OF BIRTH:  22-Feb-1917   DATE OF CONSULTATION:  03/11/2008  DATE OF DISCHARGE:                                 CONSULTATION   Mrs. Grecco has a normal mood and interest.  She has intact hope.  She has not been drooling.  She has not been having any hallucinations  or delusions.  She is socially appropriate and has a normal social  smile.   REVIEW OF SYSTEMS:  NEUROLOGIC:  No stiffness.   LABORATORY DATA:  Sodium 143, BUN 10, creatinine 1.11, glucose 151.   PHYSICAL EXAMINATION:  VITAL SIGNS:  Temperature 97.8.  Pulse 84.  Respiratory rate 18.  Blood pressure 145/78.  O2 saturation on room air  97%.  Bilateral elbows passive range of motion shows no stiffness or  cogwheeling.   MENTAL STATUS EXAM:  Mrs. Amedee is alert.  She is oriented to all  spheres.  Her eye contact is good.  Her affect is slightly flat at  baseline but with a broad and appropriate range.  Mood within normal  limits.  Memory function intact.  She is oriented to all spheres.  Speech is slightly soft but with normal rate and prosody.  No  dysarthria.   Thought process logical, coherent, goal-directed.  No looseness of  associations.  Thought content, no thoughts of harming herself, no  thoughts of harming others, no delusions, no hallucinations.  Insight is  good, judgment is intact.   ASSESSMENT:  293.81 psychotic disorder not otherwise specified, stable.  296.36 major depressive disorder recurrent, stable.   RECOMMENDATION:  Would try further decrease of Navane to 1 mg nightly in  1 week.   Would increase the Navane back to 2 mg nightly if her psychotic symptoms  return.   Psychiatric followup is available at one of the clinics attached to [facility], [facility] or [facility].      Antonietta Breach, M.D.  Electronically Signed     JW/MEDQ  D:  03/13/2008  T:  03/13/2008  Job:  [id number]

## 2010-11-13 NOTE — H&P (Signed)
NAME:  KAHDIJAH, ERRICKSON NO.:  [id number]   MEDICAL RECORD NO.:  [id number]          PATIENT TYPE:  INP   LOCATION:  1532                         FACILITY:  [facility]   PHYSICIAN:  Therisa Doyne, MD    DATE OF BIRTH:  Apr 22, 1917   DATE OF ADMISSION:  03/18/2008  DATE OF DISCHARGE:                              HISTORY & PHYSICAL   PRIMARY CARE Nakea Gouger:  Rosalyn Gess. Norins, MD.   CHIEF COMPLAINT:  Cough.   HISTORY OF PRESENT ILLNESS:  An 77 year old white female with a past  medical history significant for oropharyngeal dysphagia and recurrent  urinary tract infections, who presents with a cough.  Notably, the  patient has been hospitalized multiple times over the past several  weeks.  Initially, her hospitalizations were because of urinary tract  infections and psychoses.  More recently it has been for workup of  progressed dysphagia.  The family has been reporting that she has been  choking more frequently when she is eating food.  Workup has included  barium swallow and ultimately an EGD with dilation.  This evening, the  family reports that the patient has had progressive gurgling in her  chest, associated with some mildly productive cough.  These symptoms are  worse when the patient has been eating.  Because of these symptoms, the  family brought the patient for further evaluation.  Currently, she only  eats a pureed diet at home.  Otherwise, the patient and family denies  other complaints, specifically denying fevers or chills.  They do note  that she has some foul smelling urine, as well as decreased oral intake.   PAST MEDICAL HISTORY:  1. History of major depressive disorder with acute psychosis.  2. Hypertension.  3. Sciatica.  4. Urinary tract infections.  5. Dysphagia, status post esophageal dilatation last month.   SOCIAL HISTORY:  The patient does not smoke or drink.  She lives at home  with her daughter.   FAMILY HISTORY:  Not pertinent to the  history of present illness.   REVIEW OF SYSTEMS:  All systems were reviewed and are negative except as  mentioned above in history of present illness.   MEDICATIONS:  1. Lasix 20 mg daily (this medication is being held as of today).  2. Remeron 45 mg nightly.  3. Aspirin 325 mg daily.  4. Ativan 0.5 mg b.i.d.  5. Diltiazem 120 mg daily.  6. Potassium chloride 20 mEq daily.  7. Navane 2 mg daily.  8. Clonidine 0.1 mg b.i.d.  9. Maalox 17 gm daily.  10.Nystatin cream.   ALLERGIES:  NO KNOWN DRUG ALLERGIES.   PHYSICAL EXAMINATION:  VITAL SIGNS:  Temperature 98.1, blood pressure  169/55, pulse 85, respirations 20.  O2 saturation 99% on room air.  GENERAL  No acute distress.  Alert and oriented x3.  HEENT:  Normocephalic, atraumatic.  Pupils equal, round and reactive to  light and accommodation.  Extraocular movements are intact.  Oropharynx reveals dry mucous  membranes.  NECK:  Supple.  No lymphadenopathy or jugulovenous distention.  No  masses.  CARDIOVASCULAR:  Regular rate and rhythm.  No murmurs, rubs or  gallops.  LUNGS:  Clear to auscultation bilaterally.  ABDOMEN:  Positive bowel sounds.  Soft, nontender, nondistended.  EXTREMITIES:  No clubbing, cyanosis or edema.  BACK:  No CVA tenderness.  SKIN:  No rashes.   LABORATORY RESULTS:  White blood cell count 7.5, hemoglobin 12.5,  platelets 344, BUN 40, which is elevated from last week and creatinine  1.4 which is elevated from last week.  Urinalysis revealed 11-20 white  blood cells, negative for nitrates.   Chest x-ray shows mild bronchitic changes, but no acute cardiopulmonary  disease.   ASSESSMENT/PLAN:  Ms. Alleyne is an 77 year old white female who  presents with prerenal azotemia, questionable urinary tract infection  and a history consistent with aspiration with meals.   1. Will admit the patient to the [facility] under Dr.      Diamantina Monks care.   1. Prerenal azotemia, likely from a  combination of decreased oral      intake, possibly urinary tract infection and progressive dysphagia.      We will volume resuscitate the patient with normal saline at 125 mL      per hour.  We will hold her Lasix, check daily metabolic profiles,      and monitor ins and outs.   1. Questionable urinary tract infection.  The patient has evidence of      pyuria on her urinalysis; however, she is asymptomatic and has      negative nitrites.  Nonetheless, she is at risk for urinary tract      infections given her clinical history and her recent admissions to      the hospital.  We will treat her empirically with Levaquin 500 mg      b.i.d.  We will check a urine culture.   1. Questionable aspiration/dysphagia with meals.  The family reports a      history of increased choking when the patient is eating.  Right      now, there is no definite sign of infection.  However, she is at      risk for hospital-acquired pneumonia since she has been      hospitalized recently and her clinical syndrome could fit this      picture.  Because of this, will use Levaquin for treatment of      potential pneumonia as she will already be on Levaquin for      treatment of her urinary tract infection.  The patient will need a      repeat speech therapy evaluation for continued oropharyngeal      training and exercises to improve her dysphagia.   1. Hypertension.  Continue home medications of clonidine and      diltiazem.   1. Fluid/Electrolyte/Nutrition:  Normal saline at 125 mL per hour.      Electrolytes are stable.  Pureed diet with speech evaluation.   1. DVT prophylaxis with Lovenox.      Therisa Doyne, MD  Electronically Signed     SJT/MEDQ  D:  03/19/2008  T:  03/19/2008  Job:  [id number]

## 2010-11-13 NOTE — H&P (Signed)
NAME:  [name] [name], BISSETTE NO.:  [id number]   MEDICAL RECORD NO.:  [id number]          PATIENT TYPE:  INP   LOCATION:  1519                         FACILITY:  [facility]   PHYSICIAN:  Michiel Cowboy, MDDATE OF BIRTH:  September 06, 1916   DATE OF ADMISSION:  02/10/2008  DATE OF DISCHARGE:                              HISTORY & PHYSICAL   PRIMARY CARE Orlandus Borowski:  Rosalyn Gess. Norins, M.D.   CHIEF COMPLAINT:  Confusion.   HISTORY OF THE PRESENT ILLNESS:  The patient is an 77 year old female  who had a recent admission to the [facility] with acute delirium  and she was apparently discharged home on February 05, 2008 when her  mental status was somewhat improved.  Her acute delirium was attributed  newly initiated prednisone and slight hyponatremia.  Per her family the  patient started to have mental decline at home, and she was not eating,  not taking her medications, and was acting very anxious and agitated.  Also, profoundly, she has been coughing, but has had no fevers, no  chills and is not complaining of any chest pain.  Otherwise her review  of systems is unremarkable.   PAST MEDICAL HISTORY:  The past medical history is significant for:  1. History of sciatica.  2. Hypertension.  3. Recent urinary tract infection, which was treated with Ceftin for 5      days and ultimately resolved.   SOCIAL HISTORY:  The patient does not smoke.  She used to dip snuff, but  not currently.  She does not drink alcohol.  She lives at home with her  family.   FAMILY HISTORY:  The family history is noncontributory.   ALLERGIES:  No known drug allergies; however, she had a bad reaction to  PREDNISONE.   MEDICATIONS:  1. Aspirin 325 mg daily.  2. Alprazolam 0.25 mg as needed; however, it is confusing whether it      is scheduled for three times a day, as needed every 6 hours.  The      bottle states it is moreover as needed dose.  3. Prilosec 20 mg every day.  4. Potassium 20 mEq  every day.  5. Clonidine 0.1 mg twice a day.  6. Remeron 50 mg at bedtime.  7. Lasix 20 mg by mouth daily.  8. Zoloft, newly started, 25 mg by mouth daily.   PHYSICAL EXAMINATION:  VITAL SIGNS:  Temperature 97.7, blood pressure  145/65, pulse 99, respirations 18, and satting 94% on room air.  GENERAL APPEARANCE:  The patient appears to be in no acute distress.  She is alert and oriented to situation and place, but not the date.  She  knows everybody around her and she seems to be calm.  HEENT AND SKIN:  Head is atraumatic.  Some decreased skin turgor and dry  mucous membranes.  LUNGS:  The lungs have distant breath sounds bilaterally with some  decreased breath sounds possibly on the right.  HEART:  The heart has a regular rate and rhythm.  No murmurs can be  appreciated.  ABDOMEN:  The abdomen is soft, nontender and nondistended.  EXTREMITIES:  The lower extremities are without edema.  NEUROLOGIC EXAMINATION:  Neurologically nonfocal.   LABORATORY DATA:  White blood cell count was not obtained and hemoglobin  11.2.  Sodium 133, potassium 3.2 and creatinine 1.2.  UA shows some  leukocyte esterase - leukocytes, but no nitrites.   ASSESSMENT AND PLAN:  1. This is an elderly female presenting with recurrence of her mental      status changes.  The etiology, could be again, multifactorial or      possibly related to newly initiated Zoloft versus persistence of      her urinary tract infection versus she has a cough with possible      pneumonia.  We will obtain a chest x-ray.  If there is evidence of      pneumonia we will broaden her coverage to cover potential hospital-      acquired pneumonia since the patient was very recently discharged      from the hospital.  I will stop Zoloft, continue Remeron and      continue Xanax.  I will defer to the primary team in the morning if      they want the Xanax to be scheduled or as needed.  2. Possible urinary tract infection potentially  untreated.  I will      restart Rocephin ( per sensitivities the patient's strain is      sensitive to that).  3. Hypertension.  Continue clonidine.  If this was recently started      will also consider switching to norvasc as clonidine can have      central nervous system effect in the elderly.  4. Hyponatremia, fairly mild.  The patient appears to be clinically      slightly dehydrated.  I will give gentle intravenous rehydration      and recheck in the morning.  The patient had recent hyponatremia      workup performed.  5. Decreased potassium.  I will replace.  6. Prophylaxes.  Protonix and Lovenox.  7. Code Status.  Per discussion with the family they want her to be a      Full Code, and ask that we do everything until the family gets to      the bedside at which point they will think about actually      discontinuation of care, but they do want her to be kept alive      until the family is able to arrive at her bedside.      Michiel Cowboy, MD  Electronically Signed     AVD/MEDQ  D:  02/11/2008  T:  02/11/2008  Job:  [id number]   cc:   Rosalyn Gess. Norins, MD  [address]  [place]  [place] [address]

## 2010-11-13 NOTE — Consult Note (Signed)
NAME:  LUDA, CHARBONNEAU NO.:  [id number]   MEDICAL RECORD NO.:  [id number]          PATIENT TYPE:  INP   LOCATION:  1519                         FACILITY:  [facility]   PHYSICIAN:  Antonietta Breach, M.D.  DATE OF BIRTH:  March 07, 1917   DATE OF CONSULTATION:  02/16/2008  DATE OF DISCHARGE:                                 CONSULTATION   Mrs. Schuff paranoia has improved.  She is now having very mild  paranoia short-lived  twice a day.  When she does have it, her daughter  is able to help the patient change  the subject.  She is not agitated or  combative.  She takes her medication and food cooperatively.   REVIEW OF SYSTEMS:  NEUROLOGIC:  No stiffness or other extrapyramidal  side effects.   PHYSICAL EXAMINATION:  VITAL SIGNS:  Temperature 98.2, pulse 95,  respiratory rate 20, blood pressure 165/74, O2 saturation on room air  94%.   MENTAL STATUS EXAM:  Mrs. Strickland is alert.  She is oriented to all  spheres.  Her attention span is within normal limits.  Her affect is  mildly flat at baseline but she has a pleasant social smile.  Her mood  is within normal limits.  Her memory function is intact.  Speech is soft  without dysarthria.  Thought process is coherent.  Thought content - no  hallucinations, no delusions, no thoughts of harming herself or others.   Insight is partial.  Judgment is intact for social cooperation.   ASSESSMENT:  293.81 psychotic disorder not otherwise specified with  delusions, greatly improved.   The options for Mrs. Smoots with this level of improvement include a  Geropsychiatric Unit for finishing the elimination of all psychotic  symptoms while continuing the Navane regimen.   The other options would be going home to live with the daughter with  intensive outpatient psychiatric follow-up at one of the intensive  outpatient programs locally.  Next, the other option would be to  transfer to an assisted-living facility with  follow-up at outpatient  psychiatry.   Would continue the Navane regimen of 3 mg daily until the psychosis is  completely resolved, then at 4 weeks would try to taper the Navane to as  low as possible.   Would continue her Remeron 30 mg daily for antidepression.   The undersigned discussed the case with the patient's daughter as well  as the Child psychotherapist.      Antonietta Breach, M.D.  Electronically Signed    JW/MEDQ  D:  02/16/2008  T:  02/16/2008  Job:  [id number]

## 2010-11-13 NOTE — Discharge Summary (Signed)
NAME:  [name] [name], [name] [name] NO.:  [id number]   MEDICAL RECORD NO.:  [id number]          PATIENT TYPE:  INP   LOCATION:  1519                         FACILITY:  [facility]   PHYSICIAN:  Raenette Rover. Felicity Coyer, MDDATE OF BIRTH:  [birth year]   DATE OF ADMISSION:  02/10/2008  DATE OF DISCHARGE:  02/18/2008                               DISCHARGE SUMMARY   ADDENDUM:  Please see complete discharge dictation done previously.  As  mentioned in last discharge summary, at the time of dictation we were  awaiting acceptance from [facility]; however the  patient has not qualified for transfer to this facility.  In addition,  Child psychotherapist did summit request to [facility] in  [place], [place]; however, the patient also did not meet criteria  for admission here.  At this time, daughter has decided that the patient  will return home with her and her husband with very close intensive  outpatient psych follow-up to be scheduled prior to discharge.  In  addition, the patient will be scheduled to see primary care physician  next week in order to ensure efficacy of newly prescribed medications.  The patient is medically stable for discharge home at this time.      Cordelia Pen, NP      Raenette Rover. Felicity Coyer, MD  Electronically Signed    LE/MEDQ  D:  02/18/2008  T:  02/18/2008  Job:  [id number]   cc:   Rosalyn Gess. Norins, MD  [address]  [place]  [place] [address]

## 2010-11-13 NOTE — Consult Note (Signed)
NAME:  [name] [name], [name] [name] NO.:  [id number]   MEDICAL RECORD NO.:  [id number]          PATIENT TYPE:  INP   LOCATION:  5524                         FACILITY:  [facility]   PHYSICIAN:  Antonietta Breach, M.D.  DATE OF BIRTH:  31-Dec-1916   DATE OF CONSULTATION:  03/09/2008  DATE OF DISCHARGE:                                 CONSULTATION   REQUESTING PHYSICIAN:  [name].   REASON FOR CONSULTATION:  Adverse psychotropic medication effects,  antipsychotic therapy.   DATA:  Consultation March 09, 2008.   HISTORY OF PRESENT ILLNESS:  [name] [name] is an 77 year old female  admitted to the [facility] on September 8 due to dysphasia.   The patients has had difficulty swallowing.  This is the first time that  she has had this impairment, and it has been going on for approximately  four days.  Her gastroenterology workup found a very slight esophageal  stricture which cannot explain her dysphagia.   The pattern of dysphasia is correlated with her Navane treatment.  She  also has had some drooling as well as tremor.   She has maintained a normal mood and has not returned to any of the  delusions, uncharacteristic irritability that are part of her psychosis.   She maintains intact memory function and most of her orientation.  She  is cooperative with care.  She has intact interests and hope.  Part of  her psychosis involved a belief that she was not in good standing with  God and that also has not returned.   She did require Atacand 0.5 mg twice today for feeling on edge.   Today, she has had a pattern of swallowing well.   The patient's daughter states that an increase in her tremor has been  associated with the Navane.   PAST PSYCHIATRIC HISTORY:  In review of the past medical record, Ms.  [name] was admitted to [facility] in her 76s.  She does have a history of multiple major depressive episodes with  psychosis.   In August of this  year, she redevelops psychosis with delusions that she  had been lying and with the belief that she had lost her standing with  God.  She also became mute at times with uncharacteristic irritability.  She had received Zoloft which resulted in increased agitation.  Her  agitation was so severe that it required 4 people to get her blood  pressure in the emergency room at that time.   She was started on Navane which was increased at 3 mg every night and  resulted in resolution of her psychotic symptoms.  She was placed on  Remeron which resolved her depressive symptoms.   FAMILY PSYCHIATRIC HISTORY:  [name] [name] mother required  institutionalization in the last years of her life.   SOCIAL HISTORY:  [name] [name] is widowed.  She does not use any  alcohol or illegal drugs.  She has a daughter who assists her regularly.  She has a sister in [place].  She resides in the [employer] [facility] with  her husband.   PAST MEDICAL HISTORY:  Slight  esophageal stricture, but not enough to  explain swallowing disruption.   MEDICATIONS:  The MAR is reviewed.  Psychotropics include:  1. Ativan 0.5 mg b.i.d.  2. Remeron 30 mg every night.  3. Navane 3 mg every night.  4. Ativan 0.5 mg q.6 h. p.r.n.   LABORATORY DATA:  Sodium 139, BUN 17, creatinine 1.19, sodium 145, TSH  and T4 within normal limits.   SGOT 19, SGPT 13, WBC 7.4, hemoglobin 11.8, platelet count 384.   The patient has no known drug allergies.   REVIEW OF SYSTEMS:  Constitutional, head, eyes, ears, nose, throat,  mouth, neurologic, psychiatric, cardiovascular, respiratory,  gastrointestinal, musculoskeletal, hematologic, lymphatic, endocrine,  metabolic all unremarkable.   EXAMINATION:  VITAL SIGNS:  Temperature 98.6, pulse 91, respiratory rate  20, blood pressure 129/64, O2 saturation on room air 98%.  GENERAL APPEARANCE:  [name] [name] is an elderly female lying in a  supine position in her hospital bed with a slightly flat  faces.  She  does have tremor at rest, most prominent in the left upper extremity.  The frequency is consistent with a parkinsonian tremor.  Bilateral elbow  passive range of motion exam.  No cogwheeling or rigidity.  Ms.  Buttacavoli is no longer drooling.   MENTAL STATUS EXAM:  [name] [name] is alert.  Her attention span is  slightly decreased.  Concentration is within normal limits.  Her affect  is flat.  Her mood is within normal limits.  On orientation testing, she  states that the year is 35.  However, she is intact with month, day of  the month, place and person.  Memory is intact to immediate, recent and  remote.  Speech is involving a mildly flat prosody.  There is no  dysarthria.   Thought process:  Logical, coherent, goal-directed.  No looseness of  associations.  Thought content:  No thoughts of harming herself, no  thoughts of harming others, no delusions, no hallucinations.  Insight is  intact.  Judgment is intact.   ASSESSMENT:  AXIS I:  1. 293.81 psychotic disorder not otherwise specified, stable.  2. Major depressive disorder, recurrent in remission.  AXIS II:  None.  AXIS III:  Extrapyramidal symptoms secondary to Navane.  AXIS IV:  General medical.  AXIS V:  55.   The undersigned provided education with the patient and her daughter.  The patient consistently requests that her daughter be present for any  explanations of problems and treatment.   RECOMMENDATION:  Would decrease the Navane to 1.5 mg p.o. every night  and would further decrease the Navane at 0.5-mg increments if any of the  extrapyramidal symptoms or dysphagia are still occurring.  If the  patient's psychotic symptoms return before the adverse effects resolve  on the taper, she will require a switch to an atypical antipsychotic.   If her QTC is less than 450 milliseconds, Seroquel would be an atypical  of choice given the low risk of extrapyramidal side effects.   Outpatient psychiatric  follow-up can be obtained at one of the clinics  attached to [facility], [facility] or [facility].  The  case manager can arrange the follow-up.   Other possibilities of follow-up include one of the private psychiatrist  that will take Medicare.   Would continue her Remeron 30 mg every night for antidepression.   Once the patient is stable in her antipsychotic maintenance, would try  to decrease the Atacand to as low as possible slowly.  Antonietta Breach, M.D.  Electronically Signed     JW/MEDQ  D:  03/10/2008  T:  03/10/2008  Job:  [id number]

## 2010-11-14 ENCOUNTER — Telehealth: Payer: Self-pay | Admitting: *Deleted

## 2010-11-14 NOTE — Telephone Encounter (Signed)
Daughter called - We need to send order w/correct directions to assisted living for oxycodone. Order written and pending signature. Will be faxed to [phone]

## 2010-11-15 ENCOUNTER — Other Ambulatory Visit: Payer: Self-pay | Admitting: *Deleted

## 2010-11-16 ENCOUNTER — Encounter: Payer: Self-pay | Admitting: Internal Medicine

## 2010-11-16 ENCOUNTER — Ambulatory Visit (INDEPENDENT_AMBULATORY_CARE_PROVIDER_SITE_OTHER): Payer: Medicare Other | Admitting: Internal Medicine

## 2010-11-16 ENCOUNTER — Ambulatory Visit (INDEPENDENT_AMBULATORY_CARE_PROVIDER_SITE_OTHER)
Admission: RE | Admit: 2010-11-16 | Discharge: 2010-11-16 | Disposition: A | Discharge status: Home / Self Care | Payer: Medicare Other | Source: Ambulatory Visit | Attending: Internal Medicine | Admitting: Internal Medicine

## 2010-11-16 ENCOUNTER — Telehealth: Payer: Self-pay | Admitting: *Deleted

## 2010-11-16 VITALS — BP 158/78 | HR 92 | Temp 98.5°F | Resp 16 | Wt 125.4 lb

## 2010-11-16 DIAGNOSIS — R062 Wheezing: Secondary | ICD-10-CM

## 2010-11-16 DIAGNOSIS — I509 Heart failure, unspecified: Secondary | ICD-10-CM

## 2010-11-16 MED ORDER — ALBUTEROL SULFATE HFA 108 (90 BASE) MCG/ACT IN AERS: 2.0000 | INHALATION_SPRAY | RESPIRATORY_TRACT | Status: DC | PRN

## 2010-11-16 NOTE — Telephone Encounter (Signed)
Carney Bern aware, CXR entered. Pt to come check in first and then go down for cxr.

## 2010-11-16 NOTE — Telephone Encounter (Signed)
Pt's daughter is out of town - Engineer, structural (retired Engineer, civil (consulting)) is concerned and req OV with Dr Debby Bud today. Pt has had a "cold" w/chest congestion and wheezing this week and is feeling much worse today. Would you like to see pt in the office for work in apt?

## 2010-11-16 NOTE — Telephone Encounter (Signed)
Will see this PM

## 2010-11-16 NOTE — Progress Notes (Signed)
Subjective:    Patient ID: [name] [name], female    DOB: Jan 03, 1917, 77 y.o.   MRN: [id number]  HPI Mrs. Rogue Bussing has been sick for a week with a cough, that is not productive of sputum. She has had some SOB and DOE, decreased energy and generalized weakness. She has had increased peripheral edema. No chest pain.   Past Medical History  Diagnosis Date  . Routine general medical examination at a health care facility   . Other B-complex deficiencies   . Unspecified hypothyroidism   . Diverticulitis of colon (without mention of hemorrhage)   . Congestive heart failure, unspecified   . Stricture and stenosis of esophagus   . Duodenal ulcer, unspecified as acute or chronic, without hemorrhage, perforation, or obstruction   . Atrial fibrillation   . Edema   . Dysphagia, unspecified   . Depressive type psychosis   . Sciatica   . Other constipation   . Unspecified essential hypertension   . Arthritis   . Gallstones    Past Surgical History  Procedure Date  . Abdominal hysterectomy   . Cholecystectomy    No family history on file. History   Social History  . Marital Status: Widowed    Spouse Name: N/A    Number of Children: N/A  . Years of Education: N/A   Occupational History  . Not on file.   Social History Main Topics  . Smoking status: Never Smoker   . Smokeless tobacco: Not on file  . Alcohol Use: No  . Drug Use: No  . Sexually Active: Not on file   Other Topics Concern  . Not on file   Social History Narrative   Widowed.Lives with daughter, dependent for ADL's/ Home health coming in (9/09)Discussed code status: will not do CPR, or mechanical ventilation.       Review of Systems Review of Systems  Constitutional:  Negative for fever, chills. Decreased activity and unexpected weight change.  HENT:  Negative for hearing loss, ear pain, congestion, neck stiffness and postnasal drip.   Eyes: Negative for pain, discharge and visual disturbance.    Respiratory: Positive for chest tightness and wheezing.   Cardiovascular: Negative for chest pain and palpitations.       [No decreased exercise tolerance Gastrointestinal: [No change in bowel habit. No bloating or gas. No reflux or indigestion Genitourinary: Negative for urgency, frequency, flank pain and difficulty urinating.  Musculoskeletal: Negative for myalgias, back pain, arthralgias and gait problem.  Neurological: Negative for dizziness, tremors, weakness and headaches.  Hematological: Negative for adenopathy.  Psychiatric/Behavioral: Negative for behavioral problems and dysphoric mood.       Objective:   Physical Exam Vitals oted with Oxygen saturation of 97% on room air Gen'l: elderly white woman with mild increased work of breathing HEENT - normal Neck - supple Nodes - none Chest - harsh upper airway sounds - pushing against the closed clotis. Feint end-expiratory wheezing at both bases. No frank rales. Decrease diaphramatic excursion Cor - Reg rate and rhythm, no appreciable murmurs, JVD mild increase to Approx 5 cm at 30 degrees. Precordium is quiet. Abdomen - soft Extremities - 3+ pitting edema to mid-calve.   Chest x-ray radiologist reading: no active disease: no infiltrates no pulmonary edema       Assessment & Plan:  1. CHF - no evidence of infection by history or exam. Presentation is suggestive of mild decompensation of CHF although there is no weight change from previous visit. Oxygen saturation is normal.  Plan - Increase lasix to 40mg  AM and PM for 3 days           No change in other medications           May use nebulizer treatments every 6 hours as needed for tight upper airway discomfort.           No need for supplemental oxygen           Mucinex 2 tablets AM and PM>           Call for increased work of breathing or increased shortness of breath despite treatment.

## 2010-11-16 NOTE — Discharge Summary (Signed)
NAME:  [name] [name], [name] [name] NO.:  [id number]   MEDICAL RECORD NO.:  [id number]          PATIENT TYPE:  INP   LOCATION:  1402                         [facility]ACILITY[facility]   PHYSICIAN:  Rosalyn Gess. Norins, MD  DATE OF BIRTH:  [birth year]   DATE OF ADMISSION:  10/13/2006  DATE OF DISCHARGE:  10/15/2006                               DISCHARGE SUMMARY   ADMISSION DIAGNOSES:  1. Fall.  2. Rhabdomyolysis.  3. Hypertension.   DISCHARGE DIAGNOSES:  1. Fall.  2. Rhabdomyolysis.  3. Hypertension.   CONSULTANTS:  None.   PROCEDURES:  Trauma x-rays of the right humerus two views that were  negative for fracture; pelvis multiple views with no fracture; right  femur, two views with no fracture.  Left femur, two views, no fracture.  Lumbar spine degenerative changes but no acute findings.   HISTORY OF PRESENT ILLNESS:  Ms. Puccini is an 77 year old woman with  hypertension and frequent history of UTI's who fell at home.  She was  unable to call for help and was on the floor for several hours.  Because  of this, she was brought to [facility] emergency department for  evaluation.  She was admitted for observation and to rule out a cardiac  event as the cause of her fall.  Initial CK returned at over 1000,  suggestive of rhabdomyolysis.   Admitting exam, past medical history are documented by the admitting  physician, Dr. Clelia Croft.   HOSPITAL COURSE:  PROBLEM #1.  FALL:  The patient had no evidence of  cardiac event with negative cardiac enzymes.  No evidence of syncope.   PROBLEM #2.  RHABDOMYOLYSIS:  The patient was vigorously IV hydrated.  With this regimen the patient's CK did in fact come down nicely going  from 1085 to 868 to 770 on the day of discharge  was down to 305.  Troponin I's were negative.  With the patient having no fractures with  no evidence of any cardiac arrhythmia's, with negative cardiac enzymes  for cardiac event and with her CK being down to a tolerable  level, she  is now ready for discharge home.   PHYSICAL EXAMINATION:  VITAL SIGNS:  On discharge examination her  temperature is 97.3, blood pressure 133/60, heart rate 96, respirations  16.  GENERAL APPEARANCE:  Pleasant elderly woman complaining of leg pain in  no acute distress.  CHEST:  Chest was clear.  CARDIOVASCULAR:  2+ radial pulses.  Her precordium was quiet.  She had a  regular rate and rhythm.  ABDOMEN:  Abdomen was soft.  EXTREMITIES:  Extremities were examined.  There were no deformities.  She is able to move her legs without difficulty.  Of note the patient  was ambulated yesterday and did well.   LABORATORY DATA:  Final laboratory creatinine kinase was 305. CBC at  admission was unremarkable with hemoglobin 12.2 and normal white count.  Final chemistries with sodium 139, potassium 3.7, chloride 105, CO2 28,  BUN of 18, creatinine 1.5, glucose 102.  Urinalysis was negative.   DISPOSITION:  The patient is discharged home.  She will resume all  of  her home medications including potassium 20 mEq daily, nortriptyline 50  mg q.h.s., Xanax 0.5 mg q.6h. p.r.n., Prilosec 20 mg q.a.m., aspirin 325  mg daily.  She has completed a course of antibiotics and does not need  to continue.  The patient was on Maxzide 75/50, 1-1/2 tablet daily.   The patient is discharged home.  I have advised the patient and her  family she should have a medical alert.  She will be seen in the office  for followup in 7-10 days.   CONDITION ON DISCHARGE:  The patient's condition at time of discharge  dictation is stable and improved.      Rosalyn Gess Norins, MD  Electronically Signed     MEN/MEDQ  D:  10/15/2006  T:  10/15/2006  Job:  [id number]

## 2010-11-16 NOTE — Assessment & Plan Note (Signed)
[facility]                             PRIMARY CARE OFFICE NOTE   NAME:[name], [name] GREENWOOD                  MRN:          [id number]  DATE:02/24/2006                            DOB:          1916/10/25    [name] [name] is a delightful 77 year old woman, well-preserved, very  functional, who presents for followup evaluation and exam.  Patient remains  independent in her activities of daily living, living at [facility].  She  reports that she does her own cooking and keeps her own house and does get  out several times a week with the assistance of her daughter.   PAST MEDICAL HISTORY:   SURGICAL:  1. Hysterectomy.  2. Cholecystectomy.   MEDICAL:  1. Usual childhood diseases.  2. Hypertension.  3. __________.  4. Anxiety.   FAMILY HISTORY:  Noncontributory.   SOCIAL HISTORY:  As stated above, patient has 1 daughter.  She has a sister  who is in a nursing home.  Patient was widowed at age 39 and never  remarried.  She is very pleased with the ways things are going at this time.   PHYSICIAN ROSTER:  Dr. Patsi Sears for urology.  Patient has seen Dr. Katrinka Blazing  in the past for psychiatry.   CHART REVIEW:  The last upper endoscopy was July 10, 2004, with  esophageal stricture and dilatation.  Last colonoscopy was in 2000 and was  unremarkable.  Patient did have a neurologic evaluation in December 2005 for  gait abnormality by Dr. Delia Heady and this was a normal evaluation with  normal EMG; with suggesting possible chronic radiculopathy and lumbar  stenosis but nothing that was definitive.  Her last visit was October 02, 2004,  which showed that her MRI of the lumbar spine revealed moderate degenerative  changes with focal canal stenosis at L2-3, L3-4, L4-5, and bilateral  foraminal stenoses; however, she is not a candidate for surgical  intervention at this time.  Patient did have a mammogram performed January 28, 2006, which was negative.   CURRENT MEDICATIONS:  Potassium 8 mEq daily, Maxzide 25/50 one-half tablet  daily, aspirin 81 mg daily, alprazolam 0.25 mg one-half tablet q.6 h., B12  injections monthly, Prilosec 20 mg q.a.m., nortriptyline 50 mg nightly.   REVIEW OF SYSTEMS:  Negative for any fevers, sweats, chills.  She has had no  __________,ENT, cardiovascular, respiratory, or GI complaints.   EXAMINATION:  VITAL SIGNS:  Temperature was 97.4, blood pressure 134/79,  pulse 105, weight 133.  GENERAL APPEARANCE:  This is a well-nourished elderly woman in no acute  distress.  HEENT EXAM:  Normocephalic, atraumatic.  EACs and TMs were normal.  Oropharynx revealed the patient to have buccal lesions.  Posterior pharynx  was clear.  Conjunctivae and sclerae were clear.  Pupils equal, round, and  reactive to light and accommodation.  Patient has large cataracts  bilaterally hindering funduscopic exam.  NECK:  Neck was supple without thyromegaly.  NODES:  No adenopathy was noted in the cervical or supraclavicular regions.  CHEST:  No CVA tenderness.  LUNGS:  Clear to auscultation and percussion.  BREAST  EXAM:  Breasts were senile.  Skin was normal except for some harmless  keratotic-type moles.  Nipples without discharge.  There was no fixed mass  lesion or abnormality.  No axillary adenopathy.  CARDIOVASCULAR:  2+ radial pulse.  No JVD or carotid bruit.  She had a quiet  precordium.  The had a regular tachycardia with no murmurs to my exam.  ABDOMEN:  Soft.  No guarding or rebound.  No organosplenomegaly was noted.  PELVIC AND RECTAL EXAM:  Deferred.  EXTREMITIES:  Patient has arthritic changes at the MCP, DIP, and PIP joints  of both hands with no significant valgus deformities.  Patient's feet,  likewise, with some hammertoe deformities, which are mild.  Patient has good  range of motion of both her hips, particularly the left hip, giving a  complaint of medial thigh pain.  Palpation of soft tissues revealed no   lipomas, no abnormalities in general.  Skin was clear except for keratotic  lesions as noted.   Twelve-lead electrocardiogram revealed sinus tachycardia but otherwise was  unremarkable.   Laboratory ordered and pending includes basic metabolic panel and B12 level.   ASSESSMENT AND PLAN:  1. Hypertension:  Patient's blood pressure is adequately controlled on her      present medical regimen.  She will continue the same.  2. Anxiety:  Patient is stable and doing well on alprazolam and she will      continue the same.  She has had no clouding of mentation.  She has had      no falls, no drowsiness, and is tolerating this regimen very well.      Nortriptyline also is helpful for her sleep and she is doing well with      this as well.  3. B12 replacement:  Patient has been getting B12 shots monthly.  She is a      candidate for followup laboratory as noted.  4. Gastrointestinal:  The patient is doing well with Prilosec q.a.m. with      no active complaints or problems.   In summary, this is a very pleasant patient who seems to be medically stable  at this time on her present medical regimen, and we will continue the same.  Findings were discussed with both the patient and her daughter.                                   Rosalyn Gess Norins, MD   MEN/MedQ  DD:  02/24/2006  DT:  02/25/2006  Job #:  [id number]   cc:   Princella Ion

## 2010-11-16 NOTE — Telephone Encounter (Signed)
Faxed correct instructions yesterday am

## 2010-11-16 NOTE — Telephone Encounter (Signed)
Addended by: Lamar Sprinkles on: 11/16/2010 01:03 PM   Modules accepted: Orders

## 2010-11-16 NOTE — H&P (Signed)
NAME:  [name] [name], [name] [name] NO.:  [id number]   MEDICAL RECORD NO.:  [id number]          PATIENT TYPE:  EMS   LOCATION:  ED                           FACILITY:  [facility]   PHYSICIAN:  Bimal R. Sherryll Burger, MD      DATE OF BIRTH:  1916-09-11   DATE OF ADMISSION:  10/13/2006  DATE OF DISCHARGE:                              HISTORY & PHYSICAL   CHIEF COMPLAINT:  Fall.   HISTORY OF PRESENT ILLNESS:  This is an 77 year old female, with a  history of hypertension and frequent UTIs with fall in the past, who had  a fall this evening.  It is unclear exactly what precipitated that;  however, the patient talks quite frequently to sister who lives in an  assisted living facility.  The patient herself lives in a senior tower  and today her sister did not hear from her and got concerned as she  hears from her daily.  As a result, she called EMS.  When EMS arrived,  they noted that the patient had fallen and was unable to get up.  The  patient denies any chest pain, palpitations, shortness of breath,  lightheadedness, dizziness, loss of consciousness.  Denies focal  weakness or incontinence.  She does not really give any good description  of what the etiology of her fall was and does not endorse a mechanical  etiology.  She just states that her legs just got weak.  Her daughter,  who is with her in the emergency room, reports that the patient has had  a history of mechanical falls.  The patient currently denies any  musculoskeletal or chest pain at this point and time.   REVIEW OF SYSTEMS:  As above in the HPI.  The remaining 18-point review  of systems is negative.   PAST MEDICAL HISTORY:  1. Hypertension.  2. Anxiety.  3. Hysterectomy.  4. Cholecystectomy.  5. Frequent UTIs.  6. Depression.   ALLERGIES:  None.   CURRENT MEDICATIONS:  1. Potassium chloride 20 mEq daily.  2. Lasix dose unknown.  3. Nortriptyline 50 mg q.h.s.  4. Xanax 0.5 mg q.6h.  5. Prilosec 20 mg daily.  6.  Aspirin 325 mg daily.  7. Bactrim double strength 1 tablet b.i.d.   SOCIAL HISTORY:  The patient lives in a senior tower here in [place].  She lives alone.  She is widowed at the age of 81 and never remarried.  She has one daughter.  She has no habits.   FAMILY HISTORY:  Noncontributory.   PHYSICAL EXAMINATION:  Temperature of 97.9, pulse of 89, respiratory  rate of 18, blood pressure is 162/69.  O2 saturation 100% on room air.  GENERAL:  She is alert and oriented x3.  HEENT:  Normocephalic and atraumatic.  Pupils are equal, round, and  reactive to light.  Extraocular movements intact.  Sclerae are clear.  Neck is supple without no lymphadenopathy, no carotid bruits.  JVP is  flat, 2+ carotid impulses.  Oropharynx is clear.  LUNGS:  Clear to auscultation bilaterally without wheezes, rhonchi or  rales.  CARDIOVASCULAR EXAM:  Regular rate and  rhythm.  Normal S1-S2.  No  murmurs, gallops, or rubs.  ABDOMEN:  Positive bowel sounds, soft, nontender, and nondistended  without any palpable masses.  EXTREMITY EXAM:  She has some ecchymoses on the left forearm from this  fall; however, she has no lower extremity edema, clubbing or cyanosis.  NEUROLOGIC EXAM:  She is alert and oriented x3.  Cranial nerves III  through XII are intact.  She is moving all extremities spontaneously.   LABORATORY DATA:  White count is 9.1, hematocrit of 34.  Creatinine of  1.8, BUN of 22, potassium 3.9, bicarb 25, glucose 160.   ASSESSMENT:  1. Fall of unclear etiology:  It is unclear whether she actually had a      syncopal episode given that she is not the greatest historian.  2. Hypertension.  3. Chronic urinary tract infection.   PLAN:  The patient will be admitted to telemetry.  I have asked for a 12-  lead EKG.  We will cycle her cardiac markers.  Right now, I have held  her Lasix and potassium chloride since she does not know the dose of  these medications.  She will continue on her antibiotics for  this  chronic UTI and will remain on the remaining of her outpatient  medications.           ______________________________  Eston Esters Sherryll Burger, MD     BRS/MEDQ  D:  10/14/2006  T:  10/14/2006  Job:  [id number]

## 2010-11-18 ENCOUNTER — Other Ambulatory Visit: Payer: Self-pay | Admitting: Internal Medicine

## 2010-11-19 ENCOUNTER — Telehealth: Payer: Self-pay | Admitting: *Deleted

## 2010-11-19 MED ORDER — ALBUTEROL SULFATE (2.5 MG/3ML) 0.083% IN NEBU: 2.5000 mg | INHALATION_SOLUTION | Freq: Every day | RESPIRATORY_TRACT | Status: DC

## 2010-11-19 NOTE — Telephone Encounter (Signed)
RF

## 2010-11-22 ENCOUNTER — Encounter: Payer: Self-pay | Admitting: Internal Medicine

## 2010-11-23 ENCOUNTER — Ambulatory Visit (INDEPENDENT_AMBULATORY_CARE_PROVIDER_SITE_OTHER): Payer: Medicare Other | Admitting: Internal Medicine

## 2010-11-23 ENCOUNTER — Other Ambulatory Visit (INDEPENDENT_AMBULATORY_CARE_PROVIDER_SITE_OTHER): Payer: Medicare Other

## 2010-11-23 ENCOUNTER — Ambulatory Visit (INDEPENDENT_AMBULATORY_CARE_PROVIDER_SITE_OTHER)
Admission: RE | Admit: 2010-11-23 | Discharge: 2010-11-23 | Disposition: A | Discharge status: Home / Self Care | Payer: Medicare Other | Source: Ambulatory Visit | Attending: Internal Medicine | Admitting: Internal Medicine

## 2010-11-23 DIAGNOSIS — R05 Cough: Secondary | ICD-10-CM

## 2010-11-23 DIAGNOSIS — R0602 Shortness of breath: Secondary | ICD-10-CM

## 2010-11-23 DIAGNOSIS — I10 Essential (primary) hypertension: Secondary | ICD-10-CM

## 2010-11-23 DIAGNOSIS — I509 Heart failure, unspecified: Secondary | ICD-10-CM

## 2010-11-23 DIAGNOSIS — R059 Cough, unspecified: Secondary | ICD-10-CM

## 2010-11-23 LAB — COMPREHENSIVE METABOLIC PANEL
ALT: 19 U/L (ref 0–35)
AST: 22 U/L (ref 0–37)
Albumin: 3.6 g/dL (ref 3.5–5.2)
Alkaline Phosphatase: 86 U/L (ref 39–117)
Glucose, Bld: 103 mg/dL — ABNORMAL HIGH (ref 70–99)
Potassium: 4.8 mEq/L (ref 3.5–5.1)
Sodium: 139 mEq/L (ref 135–145)
Total Protein: 6.2 g/dL (ref 6.0–8.3)

## 2010-11-23 LAB — CBC WITH DIFFERENTIAL/PLATELET
Basophils Absolute: 0 10*3/uL (ref 0.0–0.1)
Eosinophils Relative: 1.5 % (ref 0.0–5.0)
MCV: 93.5 fl (ref 78.0–100.0)
Monocytes Absolute: 0.5 10*3/uL (ref 0.1–1.0)
Neutrophils Relative %: 67.8 % (ref 43.0–77.0)
Platelets: 266 10*3/uL (ref 150.0–400.0)
WBC: 6 10*3/uL (ref 4.5–10.5)

## 2010-11-23 LAB — BRAIN NATRIURETIC PEPTIDE: Pro B Natriuretic peptide (BNP): 222 pg/mL — ABNORMAL HIGH (ref 0.0–100.0)

## 2010-11-23 NOTE — Patient Instructions (Signed)
Wet cough - persistent. X-ray last week was clear. On exam the lungs sound clear, the sounds are in the upper airway with forced expiration. Concern for pulmonary disease or heart failure but favor rhinnitis with post-nasal drainage that is feeding the cough. Plan - repeat CXR to be sure the lungs are clear; lab - complete blood count, chemistry panel to look at potassium and a BNP which is a marker for heart failure. For the cough use robitussin DM (or the generic as long as it contains DM, guafenascin (mucinex) and NO decongestants, i.e. phenylephedrine or pseudoephedrine. Take loratadine 10mg  once a day to help with the post nasal drainage.

## 2010-11-23 NOTE — Progress Notes (Signed)
  Subjective:    Patient ID: [name] [name], female    DOB: 1917-06-12, 77 y.o.   MRN: [id number]  HPI Mrs. Yano was seen may 18th for persistent cough. She had a negative chest x-ray and her cough and shortness of breath were attributed to mild CHF. She was given increased furosemide for 3 days which did reduce the peripheral edema. She reports that she did not see improvement in the cough. She still has a wet sounding cough that is occasionally productive of a white mucus. She does have some mild DOE. NO chest pain or discomfort.   PMH, FamHx and SocHx reviewed for any changes and relevance.     Review of Systems  Constitutional: Positive for activity change and fatigue. Negative for fever, chills and unexpected weight change.  HENT: Positive for hearing loss and postnasal drip. Negative for nosebleeds, congestion, facial swelling and neck pain.   Eyes: Negative.   Respiratory: Positive for cough and shortness of breath. Negative for apnea, chest tightness, wheezing and stridor.   Cardiovascular: Negative.   Gastrointestinal: Negative.   Genitourinary: Negative for dysuria, frequency, difficulty urinating and pelvic pain.  Musculoskeletal: Negative.   Neurological: Negative.   Hematological: Negative.   Psychiatric/Behavioral: Negative.        Objective:   Physical Exam Vitals -reviewed and stable Gen'l - Elderly white woman in no acute distress but with persistent cough. Chest - lungs are clear to ausculatation with upper airway rattle and wheeze with forced expiration. No JVD. Cor - 2 + radial pulse, RRR Ext - without edeam       Assessment & Plan:  1. Cough - persistent wet cough but exam and data so far is negative.   Plan - repeat cxr           Lab to include blood counts and BNP to r/o acute failure           OTC non-sedating antihistamine, e.g. Claritin.   Addendum: CBC normal; chest x-ray no acute changes, BNP very low positive - not indicative of acute  failure.

## 2010-11-26 ENCOUNTER — Telehealth: Payer: Self-pay | Admitting: Internal Medicine

## 2010-11-26 NOTE — Telephone Encounter (Signed)
Please call pt's daughter - labs are normal, including BNP. Chest x-ray shows no active disease and no change from the last x-ray 5/18.

## 2010-11-27 NOTE — Telephone Encounter (Signed)
Informed pts daughter brenda of results

## 2010-12-07 ENCOUNTER — Telehealth: Payer: Self-pay | Admitting: *Deleted

## 2010-12-07 MED ORDER — LORAZEPAM 0.5 MG PO TABS: ORAL_TABLET | ORAL | Status: DC

## 2010-12-07 NOTE — Telephone Encounter (Signed)
Ok for refill x 5 

## 2010-12-07 NOTE — Telephone Encounter (Signed)
Received a refill request on Lorazepam 0.5 mg tablet SIG take 1 tablet by mouth every morning and 2 tablets in the evening. QTY prescribe 90. Fax from [facility] [phone]. Please Advise refills

## 2010-12-07 NOTE — Telephone Encounter (Signed)
Addended by: Vernie Murders on: 12/07/2010 05:57 PM   Modules accepted: Orders

## 2010-12-19 ENCOUNTER — Other Ambulatory Visit: Payer: Self-pay | Admitting: Internal Medicine

## 2010-12-25 ENCOUNTER — Other Ambulatory Visit: Payer: Self-pay | Admitting: Internal Medicine

## 2010-12-27 ENCOUNTER — Encounter: Payer: Self-pay | Admitting: Podiatry

## 2010-12-28 ENCOUNTER — Other Ambulatory Visit: Payer: Self-pay

## 2010-12-28 ENCOUNTER — Other Ambulatory Visit: Payer: Self-pay | Admitting: *Deleted

## 2010-12-28 MED ORDER — OXYCODONE-ACETAMINOPHEN 5-325 MG PO TABS: 1.0000 | ORAL_TABLET | Freq: Four times a day (QID) | ORAL | Status: DC | PRN

## 2010-12-28 NOTE — Telephone Encounter (Signed)
Pt's daughter called requesting refill of patient's pain medication.

## 2010-12-28 NOTE — Telephone Encounter (Signed)
Ok for refill  x3 

## 2011-01-01 NOTE — Telephone Encounter (Signed)
Rx called to pharmacy by A Bullins per EPIC

## 2011-01-21 ENCOUNTER — Other Ambulatory Visit: Payer: Self-pay | Admitting: Internal Medicine

## 2011-02-05 ENCOUNTER — Other Ambulatory Visit: Payer: Self-pay | Admitting: Internal Medicine

## 2011-02-10 ENCOUNTER — Other Ambulatory Visit: Payer: Self-pay | Admitting: Internal Medicine

## 2011-02-11 ENCOUNTER — Other Ambulatory Visit: Payer: Self-pay | Admitting: Internal Medicine

## 2011-02-19 ENCOUNTER — Other Ambulatory Visit: Payer: Self-pay | Admitting: Internal Medicine

## 2011-02-20 ENCOUNTER — Telehealth: Payer: Self-pay | Admitting: *Deleted

## 2011-02-20 NOTE — Telephone Encounter (Signed)
Daughter is req to pick up RF of pt's oxycodone.

## 2011-02-20 NOTE — Telephone Encounter (Signed)
REFILL REQUESTED FOR OXYCODONE  Also - pt has had injections for her back/hip pain that per daughter have "worn off" she is req to get set up for more injections. Says it was done at the hospital by neurosurgeon?

## 2011-02-20 NOTE — Telephone Encounter (Signed)
OK for refill oxycodone 3o day supply x 3. I cannot locate in EPIC info to identify her NS, please help

## 2011-02-21 MED ORDER — OXYCODONE-ACETAMINOPHEN 5-325 MG PO TABS: 1.0000 | ORAL_TABLET | Freq: Four times a day (QID) | ORAL | Status: DC | PRN

## 2011-02-21 NOTE — Telephone Encounter (Signed)
Pending sigature

## 2011-02-21 NOTE — Telephone Encounter (Signed)
Rx's ready, Daughter aware to pick up.  Pt has seen Dr Danielle Dess at [facility]. She will call their office to discuss f/u and will call us if new referral is needed.

## 2011-03-29 LAB — CBC
HCT: 33.9 — ABNORMAL LOW
HCT: 34.6 — ABNORMAL LOW
Hemoglobin: 11.7 — ABNORMAL LOW
MCHC: 33.7
MCHC: 33.8
MCV: 90.3
MCV: 90.6
Platelets: 332
Platelets: 337
Platelets: 379
Platelets: 402 — ABNORMAL HIGH
RBC: 3.42 — ABNORMAL LOW
RBC: 3.82 — ABNORMAL LOW
RDW: 15
RDW: 15.3
WBC: 11.9 — ABNORMAL HIGH
WBC: 6.8

## 2011-03-29 LAB — URINE CULTURE
Colony Count: >=100000
Culture: NO GROWTH
Special Requests: NEGATIVE

## 2011-03-29 LAB — DIFFERENTIAL
Basophils Absolute: 0
Basophils Relative: 0
Eosinophils Absolute: 0
Eosinophils Relative: 1
Lymphocytes Relative: 15
Lymphs Abs: 0.7
Monocytes Relative: 4
Neutro Abs: 5.9
Neutrophils Relative %: 91 — ABNORMAL HIGH

## 2011-03-29 LAB — COMPREHENSIVE METABOLIC PANEL
ALT: 12
ALT: 23
AST: 12
AST: 21
Albumin: 2.4 — ABNORMAL LOW
Alkaline Phosphatase: 94
CO2: 27
Calcium: 8.8
Chloride: 104
Creatinine, Ser: 0.96
GFR calc Af Amer: >60
GFR calc Af Amer: >60
GFR calc non Af Amer: 55 — ABNORMAL LOW
Glucose, Bld: 159 — ABNORMAL HIGH
Potassium: 4.1
Sodium: 125 — ABNORMAL LOW
Sodium: 140
Total Bilirubin: 0.5
Total Protein: 5.9 — ABNORMAL LOW

## 2011-03-29 LAB — BASIC METABOLIC PANEL
BUN: 13
BUN: 14
CO2: 30
Chloride: 88 — ABNORMAL LOW
Chloride: 92 — ABNORMAL LOW
Creatinine, Ser: 1.01
Glucose, Bld: 125 — ABNORMAL HIGH
Potassium: 3.6

## 2011-03-29 LAB — URINALYSIS, ROUTINE W REFLEX MICROSCOPIC
Bilirubin Urine: NEGATIVE
Glucose, UA: NEGATIVE
Hgb urine dipstick: NEGATIVE
Ketones, ur: NEGATIVE
Protein, ur: NEGATIVE
Specific Gravity, Urine: 1.011
pH: 6.5

## 2011-03-29 LAB — URINE MICROSCOPIC-ADD ON

## 2011-03-29 LAB — POCT I-STAT, CHEM 8
BUN: 25 — ABNORMAL HIGH
Calcium, Ion: 1.16
HCT: 33 — ABNORMAL LOW
Sodium: 133 — ABNORMAL LOW
TCO2: 32

## 2011-03-29 LAB — APTT: aPTT: 30

## 2011-03-29 LAB — TSH: TSH: 4.09

## 2011-04-01 LAB — CBC
HCT: 38.1
Hemoglobin: 11.3 — ABNORMAL LOW
MCHC: 32.9
MCHC: 33.4
MCV: 92.4
MCV: 93.2
Platelets: 274
Platelets: 344
RBC: 3.63 — ABNORMAL LOW
RBC: 3.66 — ABNORMAL LOW
WBC: 4.8
WBC: 4.9
WBC: 7.5

## 2011-04-01 LAB — BASIC METABOLIC PANEL
BUN: 40 — ABNORMAL HIGH
CO2: 23
CO2: 23
CO2: 27
Calcium: 8.6
Calcium: 9.2
Chloride: 107
Chloride: 114 — ABNORMAL HIGH
Chloride: 115 — ABNORMAL HIGH
Chloride: 117 — ABNORMAL HIGH
Creatinine, Ser: 0.94
Creatinine, Ser: 1.05
Creatinine, Ser: 1.44 — ABNORMAL HIGH
GFR calc Af Amer: 60 — ABNORMAL LOW
GFR calc Af Amer: >60
GFR calc non Af Amer: 49 — ABNORMAL LOW
Glucose, Bld: 101 — ABNORMAL HIGH
Glucose, Bld: 98
Potassium: 3.6
Potassium: 4.2
Sodium: 145

## 2011-04-01 LAB — URINALYSIS, ROUTINE W REFLEX MICROSCOPIC
Glucose, UA: NEGATIVE
Ketones, ur: NEGATIVE
Protein, ur: NEGATIVE
Urobilinogen, UA: 0.2

## 2011-04-01 LAB — URINE CULTURE: Colony Count: >=100000

## 2011-04-01 LAB — DIFFERENTIAL
Basophils Relative: 4 — ABNORMAL HIGH
Eosinophils Absolute: 0.1
Eosinophils Relative: 1
Lymphs Abs: 1.3
Monocytes Relative: 15 — ABNORMAL HIGH
Neutrophils Relative %: 64

## 2011-04-01 LAB — URINE MICROSCOPIC-ADD ON

## 2011-04-01 LAB — GLUCOSE, CAPILLARY: Glucose-Capillary: 212 — ABNORMAL HIGH

## 2011-04-03 LAB — COMPREHENSIVE METABOLIC PANEL
Albumin: 2.7 — ABNORMAL LOW
Alkaline Phosphatase: 85
BUN: 23
Chloride: 107
Creatinine, Ser: 1.33 — ABNORMAL HIGH
Glucose, Bld: 147 — ABNORMAL HIGH
Potassium: 3.2 — ABNORMAL LOW
Total Bilirubin: 0.5

## 2011-04-03 LAB — BASIC METABOLIC PANEL
BUN: 17
CO2: 25
Chloride: 103
Chloride: 104
Chloride: 107
Creatinine, Ser: 1.19
GFR calc non Af Amer: 42 — ABNORMAL LOW
GFR calc non Af Amer: 46 — ABNORMAL LOW
Glucose, Bld: 117 — ABNORMAL HIGH
Glucose, Bld: 145 — ABNORMAL HIGH
Glucose, Bld: 151 — ABNORMAL HIGH
Potassium: 2.9 — ABNORMAL LOW
Potassium: 3.5
Potassium: 3.8
Sodium: 139
Sodium: 143

## 2011-04-03 LAB — URINALYSIS, ROUTINE W REFLEX MICROSCOPIC
Ketones, ur: NEGATIVE
Nitrite: NEGATIVE
Protein, ur: NEGATIVE
Urobilinogen, UA: 0.2

## 2011-04-03 LAB — CBC
HCT: 34.3 — ABNORMAL LOW
Hemoglobin: 11.8 — ABNORMAL LOW
MCV: 90.8
Platelets: 384
RDW: 16.3 — ABNORMAL HIGH
WBC: 7.4

## 2011-04-03 LAB — TSH: TSH: 3.192

## 2011-04-03 LAB — URINE MICROSCOPIC-ADD ON

## 2011-04-03 LAB — T4, FREE: Free T4: 0.98

## 2011-04-04 ENCOUNTER — Other Ambulatory Visit: Payer: Self-pay | Admitting: Internal Medicine

## 2011-04-08 ENCOUNTER — Telehealth: Payer: Self-pay | Admitting: *Deleted

## 2011-04-08 NOTE — Telephone Encounter (Signed)
error 

## 2011-04-25 ENCOUNTER — Other Ambulatory Visit: Payer: Self-pay | Admitting: Internal Medicine

## 2011-04-30 ENCOUNTER — Ambulatory Visit (INDEPENDENT_AMBULATORY_CARE_PROVIDER_SITE_OTHER): Payer: Medicare Other | Admitting: Internal Medicine

## 2011-04-30 DIAGNOSIS — I509 Heart failure, unspecified: Secondary | ICD-10-CM

## 2011-04-30 DIAGNOSIS — I10 Essential (primary) hypertension: Secondary | ICD-10-CM

## 2011-04-30 DIAGNOSIS — K269 Duodenal ulcer, unspecified as acute or chronic, without hemorrhage or perforation: Secondary | ICD-10-CM

## 2011-04-30 DIAGNOSIS — I4891 Unspecified atrial fibrillation: Secondary | ICD-10-CM

## 2011-04-30 MED ORDER — OMEPRAZOLE 40 MG PO CPDR: 40.0000 mg | DELAYED_RELEASE_CAPSULE | Freq: Two times a day (BID) | ORAL | Status: DC

## 2011-04-30 NOTE — Assessment & Plan Note (Signed)
BP Readings from Last 3 Encounters:  04/30/11 122/64  11/23/10 104/58  11/16/10 158/78   Ok control on present meds.

## 2011-04-30 NOTE — Assessment & Plan Note (Signed)
Weight loss and abdominal pain. Suspect some degree of cardiac cachexia but also concerned for gastric irritation.  Plan - manage CHF as above           Increase PPI therapy to bid.

## 2011-04-30 NOTE — Patient Instructions (Signed)
Congestive heart failure - some worse. Plan - increase lasix to 40 mg twice a day. Please weigh today and then daily so we can track fluid loss. Continue all other heart medications - diltiazem. I will call tomorrow  Stomach - some tenderness raises concern for irritation of the stomach. Plan  - increase prilosec to 2 in the AM and 2 in the PM. When current supply gone new Rx for 40 mg strength.

## 2011-04-30 NOTE — Progress Notes (Signed)
Subjective:    Patient ID: [name] [name], female    DOB: [birth year], 77 y.o.   MRN: [id number]  HPI [name] [name] presents due to progressive weight loss - 4.4 Kg since May. She has also had increased problems being SOB/DOE. She is also having problems with stomach pain that is associated with eating. No nausea or vomiting. She has had a good appetite. Having regular BMs. Today initial Oxygen sat was 84% but came up to 98% with rest. She is taking prilosec 40 mg daily. No evidence of GI bleeding.   Past Medical History  Diagnosis Date  . Routine general medical examination at a health care facility   . Other B-complex deficiencies   . Unspecified hypothyroidism   . Diverticulitis of colon (without mention of hemorrhage)   . Congestive heart failure, unspecified   . Stricture and stenosis of esophagus   . Duodenal ulcer, unspecified as acute or chronic, without hemorrhage, perforation, or obstruction   . Atrial fibrillation   . Edema   . Dysphagia, unspecified   . Depressive type psychosis   . Sciatica   . Other constipation   . Unspecified essential hypertension   . Arthritis   . Gallstones    Past Surgical History  Procedure Date  . Abdominal hysterectomy   . Cholecystectomy    Family History  Problem Relation Age of Onset  . Cancer Other     breast   . Heart disease Other    History   Social History  . Marital Status: Widowed    Spouse Name: N/A    Number of Children: N/A  . Years of Education: N/A   Occupational History  . Not on file.   Social History Main Topics  . Smoking status: Never Smoker   . Smokeless tobacco: Not on file  . Alcohol Use: No  . Drug Use: No  . Sexually Active: Not on file   Other Topics Concern  . Not on file   Social History Narrative   Widowed.Lives with daughter, dependent for ADL's/ Home health coming in (9/09)Discussed code status: will not do CPR, or mechanical ventilation.       Review of  Systems Constitutional:  Negative for fever, chills, activity change. unexpected weight change- down 4.4 Kg since May.  HEENT:  Positive for hearing loss. No  ear pain, congestion, neck stiffness and postnasal drip. Negative for sore throat or swallowing problems. Negative for dental complaints.   Eyes: Negative for vision loss or change in visual acuity.  Respiratory: Positive for chest tightness and wheezing. Positive for DOE.   Cardiovascular: Negative for chest pain or palpitations. decreased exercise tolerance Gastrointestinal: No change in bowel habit. No bloating or gas. No reflux or indigestion Genitourinary: Negative for urgency, frequency, flank pain and difficulty urinating. Saw Dr. Patsi Sears - last Friday Musculoskeletal: Negative for myalgias, back pain, arthralgias and gait problem.  Neurological: Negative for dizziness, tremors, weakness and headaches.  Hematological: Negative for adenopathy.  Psychiatric/Behavioral: Negative for behavioral problems and dysphoric mood.       Objective:   Physical Exam Vitals noted with low O2 sat Gen'l- very elderly white woman in no acute distress. She is looking more frail HEENT- C&S clear Neck- 3-4 cm JVD at 30 degrees Chest - mild kyphosis Lungs- decreased breath sounds, no rales, no wheezes Cor - 2+ radial pulse, quiet precordium, RRR w/o murmur Abd - hypoactive BS, no HSM, no guarding or rebound. Tender to deep palpation in the epigastrium.  Ext -  no peripheral edema, no deformity.         Assessment & Plan:

## 2011-04-30 NOTE — Assessment & Plan Note (Signed)
Rate seems regular today but is definitely rate controlled on diltiazem XR 120 mg daily.  Plan - no change in medications.

## 2011-04-30 NOTE — Assessment & Plan Note (Signed)
Mild fluid overload and respiratory compromise. She has been getting more short of breath at home. Last echo '10 witjh normal EF c/w diastolic dysfunction.  Plan - Continue CCB            Increase furosemide to 40 mg bid.             Daily weight            Follow up in 24 hours.

## 2011-05-01 ENCOUNTER — Telehealth: Payer: Self-pay | Admitting: *Deleted

## 2011-05-01 NOTE — Telephone Encounter (Signed)
Called and spoke with Dtr - still very short of breath but comfortable at rest. Has lost 1 lb. Eating. Will call in AM and recheck - may need hospitalization

## 2011-05-01 NOTE — Telephone Encounter (Signed)
Spoke with pts daughter Steward Drone, she states pt rested well last night and expelled a lot of fluid. She is just getting her out of bed and will call back with update on weight and how pt is feeling this morning.

## 2011-05-01 NOTE — Telephone Encounter (Signed)
Pt is weighing in at 110 this morning. Pt daughter had her walk up and down the hall twice she states her mother is still very short winded and fatigued.

## 2011-05-02 ENCOUNTER — Inpatient Hospital Stay (HOSPITAL_COMMUNITY): Payer: Medicare Other

## 2011-05-02 ENCOUNTER — Inpatient Hospital Stay (HOSPITAL_COMMUNITY)
Admission: AD | Admit: 2011-05-02 | Discharge: 2011-05-03 | DRG: 293 | Disposition: A | Discharge status: Home / Self Care | Payer: Medicare Other | Source: Ambulatory Visit | Attending: Internal Medicine | Admitting: Internal Medicine

## 2011-05-02 ENCOUNTER — Ambulatory Visit (INDEPENDENT_AMBULATORY_CARE_PROVIDER_SITE_OTHER): Payer: Medicare Other | Admitting: Internal Medicine

## 2011-05-02 ENCOUNTER — Other Ambulatory Visit: Payer: Self-pay | Admitting: *Deleted

## 2011-05-02 ENCOUNTER — Other Ambulatory Visit: Payer: Self-pay

## 2011-05-02 ENCOUNTER — Encounter: Payer: Self-pay | Admitting: Internal Medicine

## 2011-05-02 VITALS — BP 122/50 | HR 83 | Temp 97.4°F | Wt 110.0 lb

## 2011-05-02 DIAGNOSIS — K2970 Gastritis, unspecified, without bleeding: Secondary | ICD-10-CM | POA: Diagnosis present

## 2011-05-02 DIAGNOSIS — Z23 Encounter for immunization: Secondary | ICD-10-CM

## 2011-05-02 DIAGNOSIS — I10 Essential (primary) hypertension: Secondary | ICD-10-CM

## 2011-05-02 DIAGNOSIS — K269 Duodenal ulcer, unspecified as acute or chronic, without hemorrhage or perforation: Secondary | ICD-10-CM

## 2011-05-02 DIAGNOSIS — R0989 Other specified symptoms and signs involving the circulatory and respiratory systems: Secondary | ICD-10-CM | POA: Diagnosis present

## 2011-05-02 DIAGNOSIS — E039 Hypothyroidism, unspecified: Secondary | ICD-10-CM | POA: Diagnosis present

## 2011-05-02 DIAGNOSIS — I5030 Unspecified diastolic (congestive) heart failure: Principal | ICD-10-CM | POA: Diagnosis present

## 2011-05-02 DIAGNOSIS — R0609 Other forms of dyspnea: Secondary | ICD-10-CM | POA: Diagnosis present

## 2011-05-02 DIAGNOSIS — I509 Heart failure, unspecified: Secondary | ICD-10-CM

## 2011-05-02 DIAGNOSIS — K2990 Gastroduodenitis, unspecified, without bleeding: Secondary | ICD-10-CM | POA: Diagnosis present

## 2011-05-02 DIAGNOSIS — Z66 Do not resuscitate: Secondary | ICD-10-CM | POA: Diagnosis present

## 2011-05-02 LAB — COMPREHENSIVE METABOLIC PANEL
BUN: 30 mg/dL — ABNORMAL HIGH (ref 6–23)
CO2: 28 mEq/L (ref 19–32)
Chloride: 96 mEq/L (ref 96–112)
Creatinine, Ser: 1.87 mg/dL — ABNORMAL HIGH (ref 0.50–1.10)
GFR calc Af Amer: 26 mL/min — ABNORMAL LOW (ref >90)
GFR calc non Af Amer: 22 mL/min — ABNORMAL LOW (ref >90)
Total Bilirubin: 0.3 mg/dL (ref 0.3–1.2)

## 2011-05-02 LAB — PRO B NATRIURETIC PEPTIDE: Pro B Natriuretic peptide (BNP): 695.2 pg/mL — ABNORMAL HIGH (ref 0–450)

## 2011-05-02 LAB — T4, FREE: Free T4: 1.23 ng/dL (ref 0.80–1.80)

## 2011-05-02 LAB — CBC
HCT: 37.1 % (ref 36.0–46.0)
MCV: 91.6 fL (ref 78.0–100.0)
RBC: 4.05 MIL/uL (ref 3.87–5.11)
WBC: 7.7 10*3/uL (ref 4.0–10.5)

## 2011-05-02 LAB — DIFFERENTIAL
Eosinophils Relative: 1 % (ref 0–5)
Lymphocytes Relative: 14 % (ref 12–46)
Lymphs Abs: 1.1 10*3/uL (ref 0.7–4.0)
Monocytes Relative: 9 % (ref 3–12)
Neutrophils Relative %: 76 % (ref 43–77)

## 2011-05-02 MED ORDER — LEVOTHYROXINE SODIUM 25 MCG PO TABS: 25.0000 ug | ORAL_TABLET | Freq: Every day | ORAL | Status: DC

## 2011-05-02 NOTE — Assessment & Plan Note (Signed)
History of duodenal ulcer now with several days of abdominal discomfort and progressive weight loss. NO signs of frank bleedingl and she has had a normal bowel movement.   Plan - heme test stools           Protonix 40 mg            CBC           For prolonged refractory pain will consider GI consult.

## 2011-05-02 NOTE — Assessment & Plan Note (Signed)
NO recent lab. Doubt her symptoms are related to thyroid disease.  Plan - continue home dose of synthroid            Lab - TSH, FT4

## 2011-05-02 NOTE — Progress Notes (Signed)
Subjective:    Patient ID: [name] [name], female    DOB: Jul 17, 1916, 77 y.o.   MRN: [id number]  HPI  Mrs. Arrick presents due to progressive weight loss - 4.4 Kg since May. She has also had increased problems being SOB/DOE. She is also having problems with stomach pain that is associated with eating. No nausea or vomiting. She has had a good appetite. Having regular BMs. Today initial Oxygen sat was 84% but came up to 98% with rest. She is taking prilosec 40 mg daily. No evidence of GI bleeding. Over the past 36 hours she has continued to be more short of breath than usual despite increased dose of lasix. She has also continued to have abdominal pain.   Past Medical History  Diagnosis Date  . Routine general medical examination at a health care facility   . Other B-complex deficiencies   . Unspecified hypothyroidism   . Diverticulitis of colon (without mention of hemorrhage)   . Congestive heart failure, unspecified   . Stricture and stenosis of esophagus   . Duodenal ulcer, unspecified as acute or chronic, without hemorrhage, perforation, or obstruction   . Atrial fibrillation   . Edema   . Dysphagia, unspecified   . Depressive type psychosis   . Sciatica   . Other constipation   . Unspecified essential hypertension   . Arthritis   . Gallstones    Past Surgical History  Procedure Date  . Abdominal hysterectomy   . Cholecystectomy    Family History  Problem Relation Age of Onset  . Cancer Other     breast   . Heart disease Other    History   Social History  . Marital Status: Widowed    Spouse Name: N/A    Number of Children: N/A  . Years of Education: N/A   Occupational History  . Not on file.   Social History Main Topics  . Smoking status: Never Smoker   . Smokeless tobacco: Not on file  . Alcohol Use: No  . Drug Use: No  . Sexually Active: Not on file   Other Topics Concern  . Not on file   Social History Narrative   Widowed.Lives with daughter,  dependent for ADL's/ Home health coming in (9/09)Discussed code status: will not do CPR, or mechanical ventilation.   Current Outpatient Prescriptions on File Prior to Visit  Medication Sig Dispense Refill  . albuterol (PROAIR HFA) 108 (90 BASE) MCG/ACT inhaler Inhale 2 puffs into the lungs every 4 (four) hours as needed for wheezing or shortness of breath.  8.5 g  0  . albuterol (PROVENTIL) (2.5 MG/3ML) 0.083% nebulizer solution Take 3 mLs (2.5 mg total) by nebulization at bedtime. AND q 6 hrs PRN wheezing  75 mL  6  . Chlorphen-Phenyleph-APAP (CORICIDIN D COLD/FLU/SINUS) 2-5-325 MG TABS Take by mouth as needed.        . cloNIDine (CATAPRES) 0.1 MG tablet TAKE 1 TABLET EVERY MORNING AND EVENING AND EVERY 4 HOURS AS NEEDED FOR SBP>160  100 tablet  2  . cyanocobalamin (,VITAMIN B-12,) 1000 MCG/ML injection Inject 1,000 mcg into the muscle every 30 (thirty) days.        Marland Kitchen diltiazem (CARDIZEM) 120 MG tablet TAKE 1 TABLET BY MOUTH EVERY DAY  30 tablet  3  . furosemide (LASIX) 40 MG tablet TAKE 1 TABLET EVERY DAY  30 tablet  10  . HYDROcodone-acetaminophen (NORCO) 5-325 MG per tablet Take 1 tablet by mouth every 6 (six) hours as  needed. For pain. Watch for drowsiness and constipation. Fill on or after 07/04/10.       Marland Kitchen KLOR-CON M20 20 MEQ tablet TAKE 1 TABLET BY MOUTH ONCE A DAY  30 tablet  3  . levothyroxine (SYNTHROID, LEVOTHROID) 25 MCG tablet Take 1 tablet (25 mcg total) by mouth daily.  30 tablet  2  . LORazepam (ATIVAN) 0.5 MG tablet [1] PO QAM & [2] PO QHs  90 tablet  5  . mirtazapine (REMERON) 30 MG tablet TAKE 1 TABLET BY MOUTH AT BEDTIME  90 tablet  1  . omeprazole (PRILOSEC) 40 MG capsule Take 1 capsule (40 mg total) by mouth 2 (two) times daily.  60 capsule  5  . oxyCODONE-acetaminophen (ROXICET) 5-325 MG per tablet Take 1 tablet by mouth every 6 (six) hours as needed for pain. FILL ON OR AFTER 04/23/2011  90 tablet  0  . traZODone (DESYREL) 50 MG tablet 1/2 tablet by mouth at bedtime  15  tablet  2  . trimethoprim (TRIMPEX) 100 MG tablet TAKE 1 TABLET BY MOUTH EVERY DAY  30 tablet  3  . gabapentin (NEURONTIN) 100 MG capsule Take 100 mg by mouth at bedtime.        Marland Kitchen DISCONTD: traZODone (DESYREL) 50 MG tablet 1/2 tablet once daily  15 tablet  1       Review of Systems  Constitutional:  Negative for fever, chills, activity change. unexpected weight change- down 4.4 Kg since May.  HEENT:  Positive for hearing loss. No  ear pain, congestion, neck stiffness and postnasal drip. Negative for sore throat or swallowing problems. Negative for dental complaints.   Eyes: Negative for vision loss or change in visual acuity.  Respiratory: Positive for chest tightness and wheezing. Positive for DOE.   Cardiovascular: Negative for chest pain or palpitations. decreased exercise tolerance Gastrointestinal: No change in bowel habit. No bloating or gas. No reflux or indigestion Genitourinary: Negative for urgency, frequency, flank pain and difficulty urinating. Saw Dr. Patsi Sears - last Friday Musculoskeletal: Negative for myalgias, back pain, arthralgias and gait problem.  Neurological: Negative for dizziness, tremors, weakness and headaches.  Hematological: Negative for adenopathy.  Psychiatric/Behavioral: Negative for behavioral problems and dysphoric mood.       Objective:   Physical Exam  Vitals noted with low O2 sat Gen'l- very elderly white woman in no acute distress. She is looking more frail HEENT- C&S clear Neck- 3-4 cm JVD at 30 degrees, no thyromegaly Chest - mild kyphosis, no tenderness Lungs- decreased breath sounds, no rales, no wheezes, no increased work of breathing. There is use of abdominal musculature for breathing Cor - 2+ radial pulse, quiet precordium, RRR w/o murmur Abd - hypoactive BS, no HSM, no guarding or rebound. Tender to deep palpation in the epigastrium.  Ext - no peripheral edema, no deformity.         Assessment & Plan:

## 2011-05-02 NOTE — Assessment & Plan Note (Signed)
BP Readings from Last 3 Encounters:  05/02/11 122/50  04/30/11 122/64  11/23/10 104/58   Seems to be stable.  Plan is to continue present medications.

## 2011-05-02 NOTE — Telephone Encounter (Signed)
Received call from [facility] at [facility] requesting refill on pt's levothyroxine. Refill given #30 x 2 refills.

## 2011-05-02 NOTE — Assessment & Plan Note (Signed)
Progressive shortness of breath and probable diastolic heart failure. She has not improved at home on oral furosemide  Plan - telemetry admit           2 D echo - to reassess LV function           Lab - pBNP, CMet, cardiac panel q 8 x 3, 12 lead EKG           IV lasix 40 mg IV q 12           Strict I&Os and daily weights.

## 2011-05-03 DIAGNOSIS — I5030 Unspecified diastolic (congestive) heart failure: Secondary | ICD-10-CM

## 2011-05-03 DIAGNOSIS — I10 Essential (primary) hypertension: Secondary | ICD-10-CM

## 2011-05-03 DIAGNOSIS — I369 Nonrheumatic tricuspid valve disorder, unspecified: Secondary | ICD-10-CM

## 2011-05-13 ENCOUNTER — Ambulatory Visit (INDEPENDENT_AMBULATORY_CARE_PROVIDER_SITE_OTHER): Payer: Medicare Other | Admitting: Internal Medicine

## 2011-05-13 ENCOUNTER — Ambulatory Visit: Payer: Medicare Other | Admitting: Internal Medicine

## 2011-05-13 DIAGNOSIS — I10 Essential (primary) hypertension: Secondary | ICD-10-CM

## 2011-05-13 DIAGNOSIS — I509 Heart failure, unspecified: Secondary | ICD-10-CM

## 2011-05-13 DIAGNOSIS — E538 Deficiency of other specified B group vitamins: Secondary | ICD-10-CM

## 2011-05-13 MED ORDER — CYANOCOBALAMIN 1000 MCG/ML IJ SOLN
1000.0000 ug | Freq: Once | INTRAMUSCULAR | Status: AC
  Administered 2011-05-13: 1000 ug via INTRAMUSCULAR

## 2011-05-13 NOTE — Progress Notes (Signed)
  Subjective:    Patient ID: [name] [name], female    DOB: 02-28-1917, 77 y.o.   MRN: [id number]  HPI [name] [name] is seen fore hospital f/u. She was hospitalized 24-48 hrs for SOB. She was found to have diastolic heart failure with dynamic left ventricle. When ambulated by PT her O2 level did drop to 82%. Evidently she was re-tested and with ambulation her oxygen level evidently did not drop sufficiently so that Oxygen was never delivered. She has done well since being home but she does get dypsneic with exertion.   I have reviewed the patient's medical history in detail and updated the computerized patient record.    Review of Systems  Constitutional: Negative for fever, chills and activity change.  HENT: Negative.   Eyes: Negative.   Respiratory: Positive for shortness of breath. Negative for cough, chest tightness and wheezing.   Cardiovascular: Negative for chest pain.  Gastrointestinal: Negative.   Musculoskeletal: Negative.   Skin: Negative.   Neurological: Negative for dizziness, tremors, weakness and headaches.  Hematological: Negative.        Objective:   Physical Exam Vitals reviewed - normal. Pulse Ox RA -95% at rest, w/ ambulate 150' - lowest 93% Gen'l - very elderly but spry whtie womn in nodsitress Chest - Good BS w/o wheeze or rales Cor - RRR       Assessment & Plan:

## 2011-05-13 NOTE — Assessment & Plan Note (Signed)
She has been doing well at home. She still will get a little SOB with exertion. Ambulated 150 feet - O2 sat remained in the low 90's with exertion.   Plan - continue present medical therapy

## 2011-05-13 NOTE — Patient Instructions (Signed)
CHF - doing very well with good oxygen saturation. Continue your present medications.  Call me at any time.

## 2011-05-16 NOTE — Assessment & Plan Note (Signed)
BP Readings from Last 3 Encounters:  05/13/11 122/68  05/02/11 122/50  04/30/11 122/64   Stable blood pressure on present regimen.

## 2011-05-22 ENCOUNTER — Other Ambulatory Visit: Payer: Self-pay | Admitting: Internal Medicine

## 2011-05-22 NOTE — Discharge Summary (Signed)
NAMEMarland Kitchen  [name] [name], [name] [name] NO.:  [id number]  MEDICAL RECORD NO.:  [id number]  LOCATION:  1414                         FACILITY:  [facility]  PHYSICIAN:  Rosalyn Gess. Norins, MD  DATE OF BIRTH:  01/13/1917  DATE OF ADMISSION:  05/02/2011 DATE OF DISCHARGE:  102/13/202012                              DISCHARGE SUMMARY   ADMISSION DIAGNOSES: 1. Diastolic heart failure with dyspnea on exertion. 2. Question of recurrent ulceration or gastritis, stomach. 3. Hypertension. 4. Hypothyroid disease.  DISCHARGE DIAGNOSES: 1. Diastolic heart failure with dyspnea on exertion. 2. Question of recurrent ulceration or gastritis, stomach. 3. Hypertension. 4. Hypothyroid disease.  CONSULTANTS:  None.  PROCEDURES: 1. PA and lateral chest x-ray, which showed no active disease.  No     fluid overload.  No active CHF. 2. 2D echo, which showed a very vigorous LV with ejection fraction 65%     to 70% with atrial forced filling of the ventricle and high PA     pressures consistent with diastolic dysfunction.  HISTORY OF PRESENT ILLNESS:  [name] [name] is a delightful 77 year old woman who was seen in the office on the 1st for progressive weight loss, as well as increased dyspnea on exertion.  The patient does not have shortness of breath at rest when she ambulates in the house or more than that.  She has significant shortness of breath that is limiting.  The patient also complains of stomach pain associated with eating, but denies any nausea or vomiting and does have a good appetite.  She reports she has had regular bowel movements, although none in the last 48 hours.  In the office, the patient did have an oxygen saturation of 84% when she arrived which came to 98% with rest.  The patient had been given a trial of Prilosec 40 mg daily, but continued abdominal discomfort.  The patient was admitted because of her significant dyspnea on exertion and to rule out for active fluid  overload.  Please see the EMR generated H and P for past medical history, family history, social history, and admission examination.  HOSPITAL COURSE: 1. Cardiovascular.  The patient's evaluation was negative for signs of     fluid overload with the chest x-ray did not show any significant     pulmonary edema.  PBNP was just minimally elevated at 695.  2D echo     as noted.  Laboratory was unremarkable with a normal free T4,     normal TSH.  PBNP was 695 as noted.  CBC was normal.  Metabolic     panel significant for chronic renal insufficiency with a creatinine     1.87, sodium of 133.  Liver functions were minimally elevated.  The     patient was seen and evaluated by Physical Therapy.  She was able     ambulate with a rolling walker, but her oxygen saturation dropped     to 82%.  This improved quickly to 92% with rest.  Physical Therapy     recommended home health PT, possibly a home health aide to help     with ADLs and supplemental oxygen to be used with activity given     her significant  desaturation.  With the patient's cardiac folic     cardiac evaluation being complete, with her being cardiac stable,     at this point, she is ready for discharge home with physical     therapy recommendations to be followed including home oxygen. 2. GI.  The patient had no evidence of GI bleeding with no     hematochezia, hematemesis, or melena.  Hemoglobin is stable.  She     was started on Protonix 40 mg b.i.d.  She reports that she has had     a decreased abdominal discomfort.  She has been able to tolerate a     diet.  DISCHARGE EXAMINATION:  VITAL SIGNS:  Temperature was 97.7, blood pressure 130/75, heart rate 77, respirations were 20, and oxygen saturation at rest was 96%. GENERAL APPEARANCE:  This is a very elderly woman sitting in a Geri chair who is in no acute distress. HEENT EXAM:  Notable for temporal wasting, but otherwise unremarkable. Conjunctivae and sclerae being  clear. NECK:  Supple. CHEST:  Patient is moving air well in the sitting position.  No wheezes were appreciated.  There is no increased work of breathing. CARDIOVASCULAR:  2+ radial pulse.  Her precordium was quiet.  Heart sounds were distant, but regular. ABDOMEN:  Soft with no epigastric tenderness on exam.  No further examination conducted.  DISPOSITION:  Patient is discharged home.  We will request Home Health Physical Therapy to see the patient.  Also, we will ask for home health aide 3 times a week to help with ADLs.  We will order home oxygen to be used with exertion and activities and will request a liquid oxygen system for portability.  CONDITION:  Patient's condition at the time of discharge dictation is medically stable with a very guarded prognosis given her advanced age. The patient will be seen in the office for followup in 7 to 10 days.     Rosalyn Gess Norins, MD     MEN/MEDQ  D:  110-16-2012  T:  05/04/2011  Job:  [id number]  Electronically Signed by [name] MD on 05/22/2011 12:19:25 PM

## 2011-05-26 DIAGNOSIS — M6281 Muscle weakness (generalized): Secondary | ICD-10-CM

## 2011-05-26 DIAGNOSIS — I509 Heart failure, unspecified: Secondary | ICD-10-CM

## 2011-05-26 DIAGNOSIS — I5030 Unspecified diastolic (congestive) heart failure: Secondary | ICD-10-CM

## 2011-05-27 ENCOUNTER — Other Ambulatory Visit: Payer: Self-pay | Admitting: Internal Medicine

## 2011-06-10 ENCOUNTER — Other Ambulatory Visit: Payer: Self-pay

## 2011-06-11 MED ORDER — LORAZEPAM 0.5 MG PO TABS: 0.5000 mg | ORAL_TABLET | Freq: Two times a day (BID) | ORAL | Status: DC

## 2011-06-19 ENCOUNTER — Other Ambulatory Visit: Payer: Self-pay | Admitting: Internal Medicine

## 2011-06-19 ENCOUNTER — Other Ambulatory Visit: Payer: Self-pay | Admitting: *Deleted

## 2011-06-19 MED ORDER — DILTIAZEM HCL 120 MG PO TABS: 120.0000 mg | ORAL_TABLET | Freq: Every day | ORAL | Status: DC

## 2011-07-19 ENCOUNTER — Other Ambulatory Visit: Payer: Self-pay | Admitting: Internal Medicine

## 2011-08-12 ENCOUNTER — Other Ambulatory Visit: Payer: Self-pay | Admitting: Internal Medicine

## 2011-08-16 ENCOUNTER — Other Ambulatory Visit: Payer: Self-pay | Admitting: Internal Medicine

## 2011-08-19 ENCOUNTER — Other Ambulatory Visit: Payer: Self-pay | Admitting: Internal Medicine

## 2011-08-21 ENCOUNTER — Other Ambulatory Visit: Payer: Self-pay | Admitting: Internal Medicine

## 2011-08-28 ENCOUNTER — Other Ambulatory Visit: Payer: Self-pay | Admitting: *Deleted

## 2011-08-28 NOTE — Telephone Encounter (Signed)
Rx in the mail

## 2011-08-28 NOTE — Telephone Encounter (Signed)
Pt's daughter called on behalf of pt requesting refill of Percocet

## 2011-08-29 NOTE — Telephone Encounter (Signed)
Pt's daughter informed rx was mailed per MD by [employer]. Closing phone note.

## 2011-09-02 ENCOUNTER — Other Ambulatory Visit: Payer: Self-pay

## 2011-09-02 MED ORDER — HYDROCODONE-ACETAMINOPHEN 5-325 MG PO TABS: 1.0000 | ORAL_TABLET | Freq: Four times a day (QID) | ORAL | Status: DC | PRN

## 2011-09-05 ENCOUNTER — Other Ambulatory Visit: Payer: Self-pay

## 2011-09-05 ENCOUNTER — Other Ambulatory Visit: Payer: Self-pay | Admitting: *Deleted

## 2011-09-05 MED ORDER — OXYCODONE-ACETAMINOPHEN 5-325 MG PO TABS: 1.0000 | ORAL_TABLET | Freq: Four times a day (QID) | ORAL | Status: DC | PRN

## 2011-09-05 NOTE — Telephone Encounter (Signed)
LMOM to inform patient that Rx scripts [orders only by [place] 03.07.13] have been signed by [facility] are ready for P/U Mon-Fri 8a-5p

## 2011-10-02 ENCOUNTER — Other Ambulatory Visit: Payer: Self-pay | Admitting: Internal Medicine

## 2011-10-04 ENCOUNTER — Other Ambulatory Visit: Payer: Self-pay | Admitting: *Deleted

## 2011-10-04 ENCOUNTER — Telehealth: Payer: Self-pay

## 2011-10-04 MED ORDER — LEVOTHYROXINE SODIUM 25 MCG PO TABS: ORAL_TABLET | ORAL | Status: DC

## 2011-10-04 MED ORDER — POTASSIUM CHLORIDE CRYS ER 20 MEQ PO TBCR: EXTENDED_RELEASE_TABLET | ORAL | Status: DC

## 2011-10-04 MED ORDER — DILTIAZEM HCL 120 MG PO TABS: 120.0000 mg | ORAL_TABLET | Freq: Every day | ORAL | Status: DC

## 2011-10-04 MED ORDER — FUROSEMIDE 40 MG PO TABS: 40.0000 mg | ORAL_TABLET | Freq: Every day | ORAL | Status: DC

## 2011-10-04 MED ORDER — OMEPRAZOLE 40 MG PO CPDR: 40.0000 mg | DELAYED_RELEASE_CAPSULE | Freq: Two times a day (BID) | ORAL | Status: DC

## 2011-10-04 NOTE — Telephone Encounter (Signed)
Rx refill request received for Trazodone 50 mg, please advise.

## 2011-10-04 NOTE — Telephone Encounter (Signed)
DONE

## 2011-10-04 NOTE — Telephone Encounter (Signed)
DONE/ [facility]

## 2011-10-06 NOTE — Telephone Encounter (Signed)
Ok to refill x 5 

## 2011-10-07 MED ORDER — MIRTAZAPINE 30 MG PO TABS: 30.0000 mg | ORAL_TABLET | Freq: Every day | ORAL | Status: DC

## 2011-10-07 MED ORDER — TRAZODONE HCL 50 MG PO TABS: 25.0000 mg | ORAL_TABLET | Freq: Every day | ORAL | Status: DC

## 2011-10-07 NOTE — Telephone Encounter (Signed)
Done

## 2011-10-07 NOTE — Telephone Encounter (Signed)
Refill remeron prn

## 2011-10-07 NOTE — Telephone Encounter (Signed)
Pt's daughter also requests refill of Remeron

## 2011-10-14 ENCOUNTER — Other Ambulatory Visit: Payer: Self-pay | Admitting: Internal Medicine

## 2011-10-21 ENCOUNTER — Other Ambulatory Visit: Payer: Self-pay | Admitting: Internal Medicine

## 2011-11-15 ENCOUNTER — Other Ambulatory Visit: Payer: Self-pay | Admitting: Internal Medicine

## 2011-11-29 ENCOUNTER — Other Ambulatory Visit: Payer: Self-pay

## 2011-11-29 MED ORDER — LORAZEPAM 0.5 MG PO TABS: ORAL_TABLET | ORAL | Status: DC

## 2011-11-29 NOTE — Telephone Encounter (Signed)
Rx phoned into pharmacy per MEN

## 2011-11-29 NOTE — Telephone Encounter (Signed)
Pharmacy called requesting refill of Lorazepam 0.5 mg 1 po qAM and 2 po qPM # 90 x 5. Per pharmacy, pt family has requested medication several times with no response, please advise.

## 2011-11-29 NOTE — Telephone Encounter (Signed)
Addended by: Anselm Jungling on: 11/29/2011 03:01 PM   Modules accepted: Orders

## 2011-12-03 ENCOUNTER — Telehealth: Payer: Self-pay | Admitting: Internal Medicine

## 2011-12-03 NOTE — Telephone Encounter (Signed)
Ok to schedule.

## 2011-12-03 NOTE — Telephone Encounter (Signed)
Steward Drone wants to know when to schedule a physical with labs after for her mother, [name] [name].   She had her last physical in Feb. 2012   [phone]

## 2011-12-04 ENCOUNTER — Other Ambulatory Visit: Payer: Self-pay | Admitting: Internal Medicine

## 2011-12-14 ENCOUNTER — Other Ambulatory Visit: Payer: Self-pay | Admitting: Internal Medicine

## 2011-12-15 ENCOUNTER — Other Ambulatory Visit: Payer: Self-pay | Admitting: Internal Medicine

## 2011-12-31 ENCOUNTER — Telehealth: Payer: Self-pay

## 2011-12-31 NOTE — Telephone Encounter (Signed)
[facility] Triage Call Report Triage Record Num: [id number] Operator: Maryfrances Bunnell Patient Name: [name] [name] Call Date & Time: 12/30/2011 8:59:51PM Patient Phone: [phone] PCP: [name] Patient Gender: Female PCP Fax : [phone] Patient DOB: September 22, 1916 Practice Name: [facility] Reason for Call: Caller: Brenda/Other; PCP: [name]; CB#: [phone]; Call regarding Cough/Congestion; Feet swollen 12/29/11. Elevated thru the night. Swelling back tonight, moderate. Coughing with spitting up of white sputum, sob with activity, and wheezing tonight onset 2030. Calling asking about giving and extra fluid pill. Afebrile. Per [place], Atraumatic Protocol all emergent symptoms ruled out with exception of "New onset or worsening shortness of breath or difficulty breathing." Disposition is ED Immediately. Oncall paged. Per Dr. Dan Humphreys may give 2nd dose of lasix tonight. If symptoms worsen must see ED. Care advice given with stated understanding. Protocol(s) Used: Edema, Atraumatic Recommended Outcome per Protocol: See ED Immediately Override Outcome if Used in Protocol: Call Provider Immediately RN Reason for Override Outcome: Nursing Judgement Used. Reason for Outcome: New onset or worsening shortness of breath or difficulty breathing Physician Instructions / Orders: May give extra Lasix tonight of 40mg . If symptoms not improved see ED tonight, f/u with office in am. Care Advice: ~ IMMEDIATE ACTION 12/30/2011 9:27:14PM Page 1 of 1 CAN

## 2012-01-12 ENCOUNTER — Emergency Department (HOSPITAL_COMMUNITY): Payer: Medicare Other

## 2012-01-12 ENCOUNTER — Observation Stay (HOSPITAL_COMMUNITY)
Admission: EM | Admit: 2012-01-12 | Discharge: 2012-01-14 | Disposition: A | Discharge status: Skilled Nursing Facility | Payer: Medicare Other | Attending: Internal Medicine | Admitting: Internal Medicine

## 2012-01-12 ENCOUNTER — Encounter (HOSPITAL_COMMUNITY): Payer: Self-pay | Admitting: Emergency Medicine

## 2012-01-12 DIAGNOSIS — M549 Dorsalgia, unspecified: Principal | ICD-10-CM

## 2012-01-12 DIAGNOSIS — I129 Hypertensive chronic kidney disease with stage 1 through stage 4 chronic kidney disease, or unspecified chronic kidney disease: Secondary | ICD-10-CM | POA: Insufficient documentation

## 2012-01-12 DIAGNOSIS — I509 Heart failure, unspecified: Secondary | ICD-10-CM | POA: Insufficient documentation

## 2012-01-12 DIAGNOSIS — E039 Hypothyroidism, unspecified: Secondary | ICD-10-CM | POA: Diagnosis present

## 2012-01-12 DIAGNOSIS — I10 Essential (primary) hypertension: Secondary | ICD-10-CM | POA: Diagnosis present

## 2012-01-12 DIAGNOSIS — W19XXXA Unspecified fall, initial encounter: Secondary | ICD-10-CM

## 2012-01-12 DIAGNOSIS — N189 Chronic kidney disease, unspecified: Secondary | ICD-10-CM

## 2012-01-12 DIAGNOSIS — I4891 Unspecified atrial fibrillation: Secondary | ICD-10-CM

## 2012-01-12 DIAGNOSIS — Z79899 Other long term (current) drug therapy: Secondary | ICD-10-CM | POA: Insufficient documentation

## 2012-01-12 DIAGNOSIS — M25559 Pain in unspecified hip: Secondary | ICD-10-CM

## 2012-01-12 HISTORY — DX: Other specified postprocedural states: Z98.890

## 2012-01-12 HISTORY — DX: Other specified postprocedural states: R11.2

## 2012-01-12 LAB — URINE MICROSCOPIC-ADD ON

## 2012-01-12 LAB — URINALYSIS, ROUTINE W REFLEX MICROSCOPIC
Bilirubin Urine: NEGATIVE
Glucose, UA: NEGATIVE mg/dL
Nitrite: NEGATIVE
Specific Gravity, Urine: 1.011 (ref 1.005–1.030)
pH: 6.5 (ref 5.0–8.0)

## 2012-01-12 MED ORDER — MORPHINE SULFATE 4 MG/ML IJ SOLN
2.0000 mg | Freq: Once | INTRAMUSCULAR | Status: AC
  Administered 2012-01-13: 2 mg via INTRAVENOUS
  Filled 2012-01-12: qty 1

## 2012-01-12 NOTE — ED Notes (Signed)
Bed:WHALA<BR> Expected date:<BR> Expected time:<BR> Means of arrival:<BR> Comments:<BR> Medic 212, 89 F, Fall

## 2012-01-12 NOTE — ED Provider Notes (Addendum)
History     CSN: [id number]  Arrival date & time 01/12/12  2245   First MD Initiated Contact with Patient 01/12/12 2312      Chief Complaint  Patient presents with  . Fall    standing  position withoutwalker     (Consider location/radiation/quality/duration/timing/severity/associated sxs/prior treatment) HPI Comments: 78 year old female states that she was walking in her house with her walker, was trying to take a lid off of her medication bottles and was having difficulty, lost her balance while her hands were off of her walker falling backwards and striking her lower back and right hip. The pain is persistent, worse with movement, not associated with shortening of the leg swelling bruising or lacerations. This occurred just prior to arrival. She denies head injury but does admit to having some lower back pain. She denies recently having fevers chills nausea vomiting abdominal pain chest pain coughing shortness of breath headaches or blurred vision.  Patient is a 78 y.o. female presenting with fall. The history is provided by the patient and a relative.  Fall    Past Medical History  Diagnosis Date  . Routine general medical examination at a health care facility   . Other B-complex deficiencies   . Unspecified hypothyroidism   . Diverticulitis of colon (without mention of hemorrhage)   . Congestive heart failure, unspecified   . Stricture and stenosis of esophagus   . Duodenal ulcer, unspecified as acute or chronic, without hemorrhage, perforation, or obstruction   . Atrial fibrillation   . Edema   . Dysphagia, unspecified   . Depressive type psychosis   . Sciatica   . Other constipation   . Unspecified essential hypertension   . Arthritis   . Gallstones     Past Surgical History  Procedure Date  . Abdominal hysterectomy   . Cholecystectomy     Family History  Problem Relation Age of Onset  . Cancer Other     breast   . Heart disease Other     History    Substance Use Topics  . Smoking status: Never Smoker   . Smokeless tobacco: Not on file  . Alcohol Use: No    OB History    Grav Para Term Preterm Abortions TAB SAB Ect Mult Living                  Review of Systems  All other systems reviewed and are negative.    Allergies  Prednisone and Sertraline hcl  Home Medications   Current Outpatient Rx  Name Route Sig Dispense Refill  . ALBUTEROL SULFATE HFA 108 (90 BASE) MCG/ACT IN AERS Inhalation Inhale 2 puffs into the lungs every 4 (four) hours as needed. For wheezing and shortness of breath     . ALBUTEROL SULFATE (2.5 MG/3ML) 0.083% IN NEBU Nebulization Take 2.5 mg by nebulization every 6 (six) hours as needed. For wheezing and shortness of breath     . CLONIDINE HCL 0.1 MG PO TABS Oral Take 0.1 mg by mouth 2 (two) times daily.      . CYANOCOBALAMIN 1000 MCG/ML IJ SOLN Intramuscular Inject 1,000 mcg into the muscle every 30 (thirty) days.     Marland Kitchen DILTIAZEM HCL ER COATED BEADS 120 MG PO CP24 Oral Take 120 mg by mouth daily.    . FUROSEMIDE 40 MG PO TABS Oral Take 40 mg by mouth daily.    Marland Kitchen GABAPENTIN 100 MG PO CAPS Oral Take 100 mg by mouth at bedtime.     Marland Kitchen  LEVOTHYROXINE SODIUM 25 MCG PO TABS Oral Take 25 mcg by mouth daily.    Marland Kitchen LORAZEPAM 0.5 MG PO TABS Oral Take 1.5 mg by mouth daily. Take 1 tablet in the morning and 2 tablets at bedtime    . MIRTAZAPINE 30 MG PO TABS Oral Take 30 mg by mouth at bedtime.    . OMEPRAZOLE 40 MG PO CPDR Oral Take 40 mg by mouth 2 (two) times daily.    . OXYCODONE-ACETAMINOPHEN 5-325 MG PO TABS Oral Take 1 tablet by mouth every 6 (six) hours as needed. FILL ON OR AFTER 11/05/11. FOR PAIN    . POTASSIUM CHLORIDE CRYS ER 20 MEQ PO TBCR Oral Take 20 mEq by mouth daily.    . TRAZODONE HCL 50 MG PO TABS Oral Take 25 mg by mouth at bedtime.    Marland Kitchen TRIMETHOPRIM 100 MG PO TABS Oral Take 100 mg by mouth daily.       BP 168/63  Pulse 90  Temp 98.1 F (36.7 C) (Oral)  Resp 19  SpO2 93%  Physical Exam   Nursing note and vitals reviewed. Constitutional: She appears well-developed and well-nourished. No distress.  HENT:  Head: Normocephalic and atraumatic.  Mouth/Throat: Oropharynx is clear and moist. No oropharyngeal exudate.  Eyes: Conjunctivae and EOM are normal. Pupils are equal, round, and reactive to light. Right eye exhibits no discharge. Left eye exhibits no discharge. No scleral icterus.  Neck: Normal range of motion. Neck supple. No JVD present. No thyromegaly present.  Cardiovascular: Normal rate, regular rhythm, normal heart sounds and intact distal pulses.  Exam reveals no gallop and no friction rub.   No murmur heard. Pulmonary/Chest: Effort normal and breath sounds normal. No respiratory distress. She has no wheezes. She has no rales.  Abdominal: Soft. Bowel sounds are normal. She exhibits no distension and no mass. There is no tenderness.  Musculoskeletal: Normal range of motion. She exhibits tenderness. She exhibits no edema.       Tenderness to palpation over the right greater trochanter, pain with range of motion of the right hip, no leg length discrepancies, patient is able to lift the leg off the bed approximately 6 inches and hold it there for 5 seconds. She does this with great difficulty  Lymphadenopathy:    She has no cervical adenopathy.  Neurological: She is alert. Coordination normal.       Speech is clear and goal-directed, moves all 4 extremities, pain with moving her right lower extremity but has appropriate strength in bilateral grips and bilateral lower extremities at the major muscle groups. Sensation to light touch intact bilaterally  Skin: Skin is warm and dry. No rash noted. No erythema.  Psychiatric: She has a normal mood and affect. Her behavior is normal.    ED Course  Procedures (including critical care time)  Labs Reviewed  URINALYSIS, ROUTINE W REFLEX MICROSCOPIC - Abnormal; Notable for the following:    Leukocytes, UA TRACE (*)     All other  components within normal limits  BASIC METABOLIC PANEL - Abnormal; Notable for the following:    Sodium 134 (*)     Glucose, Bld 122 (*)     BUN 31 (*)     Creatinine, Ser 1.51 (*)     GFR calc non Af Amer 29 (*)     GFR calc Af Amer 33 (*)     All other components within normal limits  URINE MICROSCOPIC-ADD ON - Abnormal; Notable for the following:  Bacteria, UA FEW (*)     All other components within normal limits  CBC  TYPE AND SCREEN  ABO/RH   Dg Chest 1 View  01/13/2012  *RADIOLOGY REPORT*  Clinical Data: Low back pain and right hip pain after fall.  CHEST - 1 VIEW  Comparison: 05/02/2011  Findings: Normal heart size and pulmonary vascularity. Hyperinflation suggesting emphysematous changes.  Fibrosis in the lungs.  No focal airspace consolidation.  No blunting of costophrenic angles.  No pneumothorax.  Calcification of the aorta. The surgical clips in the right upper quadrant.  Right paratracheal fullness is likely due to supine technique.  IMPRESSION: Emphysematous changes and scattered fibrosis.  No evidence of active pulmonary disease.  Original Report Authenticated By: Marlon Pel, M.D.   Dg Lumbar Spine Complete  01/13/2012  *RADIOLOGY REPORT*  Clinical Data: Low back pain after fall.  LUMBAR SPINE - COMPLETE 4+ VIEW  Comparison: Lumbar spine 01/21/2008.  MRI lumbar spine 03/06/2010.  Findings: Mild lumbar scoliosis convex towards the left.  No abnormal anterior subluxation of the lumbar vertebrae.  Normal alignment of the facet joints.  Diffuse degenerative changes with narrowed lumbar interspaces and endplate hypertrophic changes throughout.  No vertebral compression deformities.  No focal bone lesion or bone destruction.  Visualized bone cortex and trabecular architecture appear intact.  No significant change since previous study.  IMPRESSION: Scoliosis and degenerative changes of the lumbar spine.  Stable appearance since previous study.  No displaced fractures identified.   Original Report Authenticated By: Marlon Pel, M.D.   Dg Hip Complete Right  01/13/2012  *RADIOLOGY REPORT*  Clinical Data: Low back pain and right hip pain after fall.  RIGHT HIP - COMPLETE 2+ VIEW  Comparison: Right femur 10/13/2006  Findings: The pelvis, sacrum, symphysis pubis, hips, and SI joints appear intact.  No displaced fractures identified.  No focal bone lesion or bone destruction.  The right hip demonstrates degenerative changes.  Superior acetabular joint space narrowing and sclerosis.  Hypertrophic changes on the femoral head. Degenerative changes in the lower lumbar spine.  IMPRESSION: Degenerative changes in the lower lumbar spine and right hip.  No displaced pelvic or hip fractures identified.  Original Report Authenticated By: Marlon Pel, M.D.   Ct Pelvis Wo Contrast  01/13/2012  *RADIOLOGY REPORT*  Clinical Data:  Pain after fall.  CT PELVIS WITHOUT CONTRAST  Technique:  Multidetector CT imaging of the pelvis was performed following the standard protocol without intravenous contrast.  Comparison:  Plain radiographs 01/12/2012.  CT abdomen and pelvis 10/17/2009.  Findings:  Diffuse bone demineralization.  Degenerative changes in the lower lumbar spine.  The pelvis, sacrum, SI joints, symphysis pubis, pubic rami, and hips appear intact.  No evidence of acute fracture or subluxation.  No significant soft tissue hematoma.  No focal bone lesion or bone destruction.  Bone cortex and trabecular architecture appear intact.  IMPRESSION: No acute displaced fractures demonstrated in the pelvis, sacrum, or hips.  Original Report Authenticated By: Marlon Pel, M.D.     1. Back pain   2. Hip pain   3. Fall       MDM  Rule out hip fracture, pain medication  Laboratory results show that she has mild hyponatremia, baseline renal function, no anemia, urinalysis without infection or dehydration. Imaging of her chest, lumbar spine and pelvis show no signs of fractures.  Patient was unable to ambulate, discussed care with hospitalist who states that admission not possible given no significant indication and will  have social work consult in the morning for placement in a rehabilitation facility.  Family aware.  Pt stable.  Pain improved with pain medicines.   Care was discussed with the social worker who was unable to place the patient for several days, thus I called the hospitalist and they have agreed to admit the patient to observation overnight in hopes of faster placement.     Vida Roller, MD 01/13/12 1610  Vida Roller, MD 01/13/12 [phone]

## 2012-01-12 NOTE — ED Notes (Signed)
ems- states patient fell, lost balance . Was without walker when fell. Rt hip  Hurting at this time - found on rt side on floor  in kitchen. EMS  noted no deformities no rotation or shorting of rt leg. Tenderness on palpation.  No loc change , remembers all that happens,  Hx of left hip pain, back issues.  Vs- sbp-140, 80hr, 14rr,  Pain  8/10. Pain increases more on movement in rt hip .

## 2012-01-12 NOTE — ED Notes (Signed)
1st attempt to obtain labs, pt in x-ray. °

## 2012-01-13 ENCOUNTER — Emergency Department (HOSPITAL_COMMUNITY): Payer: Medicare Other

## 2012-01-13 ENCOUNTER — Encounter (HOSPITAL_COMMUNITY): Payer: Self-pay | Admitting: *Deleted

## 2012-01-13 DIAGNOSIS — W19XXXA Unspecified fall, initial encounter: Secondary | ICD-10-CM

## 2012-01-13 DIAGNOSIS — N189 Chronic kidney disease, unspecified: Secondary | ICD-10-CM

## 2012-01-13 DIAGNOSIS — M25559 Pain in unspecified hip: Secondary | ICD-10-CM

## 2012-01-13 DIAGNOSIS — M549 Dorsalgia, unspecified: Secondary | ICD-10-CM

## 2012-01-13 DIAGNOSIS — I4891 Unspecified atrial fibrillation: Secondary | ICD-10-CM

## 2012-01-13 LAB — ABO/RH: ABO/RH(D): O POS

## 2012-01-13 LAB — BASIC METABOLIC PANEL
CO2: 26 mEq/L (ref 19–32)
Chloride: 99 mEq/L (ref 96–112)
GFR calc Af Amer: 33 mL/min — ABNORMAL LOW (ref >90)
Potassium: 4.1 mEq/L (ref 3.5–5.1)

## 2012-01-13 LAB — CBC
HCT: 36.8 % (ref 36.0–46.0)
MCH: 31.4 pg (ref 26.0–34.0)
MCV: 91.8 fL (ref 78.0–100.0)
RDW: 13.3 % (ref 11.5–15.5)
WBC: 6.8 10*3/uL (ref 4.0–10.5)

## 2012-01-13 LAB — TSH: TSH: 3.712 u[IU]/mL (ref 0.350–4.500)

## 2012-01-13 LAB — CARDIAC PANEL(CRET KIN+CKTOT+MB+TROPI)
Relative Index: 4.4 — ABNORMAL HIGH (ref 0.0–2.5)
Total CK: 119 U/L (ref 7–177)

## 2012-01-13 LAB — TYPE AND SCREEN

## 2012-01-13 MED ORDER — ONDANSETRON HCL 4 MG/2ML IJ SOLN: 4.0000 mg | Freq: Four times a day (QID) | INTRAMUSCULAR | Status: DC | PRN

## 2012-01-13 MED ORDER — ACETAMINOPHEN 650 MG RE SUPP: 650.0000 mg | Freq: Four times a day (QID) | RECTAL | Status: DC | PRN

## 2012-01-13 MED ORDER — POTASSIUM CHLORIDE CRYS ER 20 MEQ PO TBCR
20.0000 meq | EXTENDED_RELEASE_TABLET | Freq: Every day | ORAL | Status: DC
  Administered 2012-01-13 – 2012-01-14 (×2): 20 meq via ORAL
  Filled 2012-01-13 (×2): qty 1

## 2012-01-13 MED ORDER — CYANOCOBALAMIN 1000 MCG/ML IJ SOLN: 1000.0000 ug | INTRAMUSCULAR | Status: DC

## 2012-01-13 MED ORDER — TUBERCULIN PPD 5 UNIT/0.1ML ID SOLN
5.0000 [IU] | Freq: Once | INTRADERMAL | Status: AC
  Administered 2012-01-13: 5 [IU] via INTRADERMAL
  Filled 2012-01-13: qty 0.1

## 2012-01-13 MED ORDER — GABAPENTIN 100 MG PO CAPS
100.0000 mg | ORAL_CAPSULE | Freq: Every day | ORAL | Status: DC
  Administered 2012-01-13: 100 mg via ORAL
  Filled 2012-01-13 (×2): qty 1

## 2012-01-13 MED ORDER — TRAZODONE 25 MG HALF TABLET
25.0000 mg | ORAL_TABLET | Freq: Every day | ORAL | Status: DC
  Administered 2012-01-13: 25 mg via ORAL
  Filled 2012-01-13 (×3): qty 1

## 2012-01-13 MED ORDER — ONDANSETRON HCL 4 MG PO TABS: 4.0000 mg | ORAL_TABLET | Freq: Four times a day (QID) | ORAL | Status: DC | PRN

## 2012-01-13 MED ORDER — LEVALBUTEROL HCL 0.63 MG/3ML IN NEBU
0.6300 mg | INHALATION_SOLUTION | RESPIRATORY_TRACT | Status: DC | PRN
Start: 2012-01-13 — End: 2012-01-14
  Filled 2012-01-13: qty 3

## 2012-01-13 MED ORDER — LORAZEPAM 1 MG PO TABS
1.0000 mg | ORAL_TABLET | Freq: Every day | ORAL | Status: DC
  Administered 2012-01-13: 1 mg via ORAL
  Filled 2012-01-13: qty 1

## 2012-01-13 MED ORDER — ONDANSETRON HCL 4 MG/2ML IJ SOLN: 4.0000 mg | Freq: Three times a day (TID) | INTRAMUSCULAR | Status: AC | PRN

## 2012-01-13 MED ORDER — ACETAMINOPHEN 325 MG PO TABS: 650.0000 mg | ORAL_TABLET | Freq: Four times a day (QID) | ORAL | Status: DC | PRN

## 2012-01-13 MED ORDER — FUROSEMIDE 40 MG PO TABS
40.0000 mg | ORAL_TABLET | Freq: Every day | ORAL | Status: DC
  Administered 2012-01-13 – 2012-01-14 (×2): 40 mg via ORAL
  Filled 2012-01-13 (×2): qty 1

## 2012-01-13 MED ORDER — MORPHINE SULFATE 2 MG/ML IJ SOLN
2.0000 mg | Freq: Once | INTRAMUSCULAR | Status: AC
  Administered 2012-01-13: 2 mg via INTRAVENOUS
  Filled 2012-01-13: qty 1

## 2012-01-13 MED ORDER — LORAZEPAM 1 MG PO TABS: 1.5000 mg | ORAL_TABLET | Freq: Every day | ORAL | Status: DC

## 2012-01-13 MED ORDER — MORPHINE SULFATE 4 MG/ML IJ SOLN: 2.0000 mg | INTRAMUSCULAR | Status: DC | PRN

## 2012-01-13 MED ORDER — LORAZEPAM 0.5 MG PO TABS
0.5000 mg | ORAL_TABLET | Freq: Every day | ORAL | Status: DC
  Administered 2012-01-13 – 2012-01-14 (×2): 0.5 mg via ORAL
  Filled 2012-01-13 (×2): qty 1

## 2012-01-13 MED ORDER — SODIUM CHLORIDE 0.9 % IJ SOLN: 3.0000 mL | Freq: Two times a day (BID) | INTRAMUSCULAR | Status: DC

## 2012-01-13 MED ORDER — PANTOPRAZOLE SODIUM 40 MG PO TBEC
40.0000 mg | DELAYED_RELEASE_TABLET | Freq: Every day | ORAL | Status: DC
  Administered 2012-01-13 – 2012-01-14 (×2): 40 mg via ORAL
  Filled 2012-01-13 (×2): qty 1

## 2012-01-13 MED ORDER — DILTIAZEM HCL ER 120 MG PO CP24
120.0000 mg | ORAL_CAPSULE | Freq: Every day | ORAL | Status: DC
  Administered 2012-01-13 – 2012-01-14 (×2): 120 mg via ORAL
  Filled 2012-01-13 (×2): qty 1

## 2012-01-13 MED ORDER — ENOXAPARIN SODIUM 40 MG/0.4ML ~~LOC~~ SOLN
40.0000 mg | SUBCUTANEOUS | Status: DC
  Administered 2012-01-13: 40 mg via SUBCUTANEOUS
  Filled 2012-01-13 (×2): qty 0.4

## 2012-01-13 MED ORDER — LEVOTHYROXINE SODIUM 25 MCG PO TABS
25.0000 ug | ORAL_TABLET | Freq: Every day | ORAL | Status: DC
  Administered 2012-01-13 – 2012-01-14 (×2): 25 ug via ORAL
  Filled 2012-01-13 (×2): qty 1

## 2012-01-13 MED ORDER — MIRTAZAPINE 30 MG PO TABS
30.0000 mg | ORAL_TABLET | Freq: Every day | ORAL | Status: DC
  Administered 2012-01-13: 30 mg via ORAL
  Filled 2012-01-13 (×2): qty 1

## 2012-01-13 MED ORDER — ALBUTEROL SULFATE HFA 108 (90 BASE) MCG/ACT IN AERS: 2.0000 | INHALATION_SPRAY | RESPIRATORY_TRACT | Status: DC | PRN

## 2012-01-13 MED ORDER — SODIUM CHLORIDE 0.9 % IV SOLN: 250.0000 mL | INTRAVENOUS | Status: DC | PRN

## 2012-01-13 MED ORDER — CLONIDINE HCL 0.1 MG PO TABS
0.1000 mg | ORAL_TABLET | Freq: Two times a day (BID) | ORAL | Status: DC
  Administered 2012-01-13 – 2012-01-14 (×2): 0.1 mg via ORAL
  Filled 2012-01-13 (×3): qty 1

## 2012-01-13 MED ORDER — SODIUM CHLORIDE 0.9 % IJ SOLN: 3.0000 mL | INTRAMUSCULAR | Status: DC | PRN

## 2012-01-13 MED ORDER — CLONIDINE HCL 0.1 MG PO TABS
0.1000 mg | ORAL_TABLET | Freq: Every day | ORAL | Status: DC
  Administered 2012-01-13: 0.1 mg via ORAL
  Filled 2012-01-13: qty 1

## 2012-01-13 MED ORDER — TRAZODONE HCL 50 MG PO TABS: 25.0000 mg | ORAL_TABLET | Freq: Every day | ORAL | Status: DC

## 2012-01-13 MED ORDER — TRIMETHOPRIM 100 MG PO TABS
100.0000 mg | ORAL_TABLET | Freq: Every day | ORAL | Status: DC
  Administered 2012-01-13 – 2012-01-14 (×2): 100 mg via ORAL
  Filled 2012-01-13 (×2): qty 1

## 2012-01-13 MED ORDER — MORPHINE SULFATE 4 MG/ML IJ SOLN: 0.5000 mg | INTRAMUSCULAR | Status: DC | PRN

## 2012-01-13 MED ORDER — OXYCODONE-ACETAMINOPHEN 5-325 MG PO TABS
1.0000 | ORAL_TABLET | Freq: Four times a day (QID) | ORAL | Status: DC | PRN
  Administered 2012-01-13: 1 via ORAL
  Filled 2012-01-13: qty 1

## 2012-01-13 NOTE — H&P (Signed)
[facility] History and Physical  [name] [name] ZOX:[id number] DOB: July 15, 1916 DOA: 01/12/2012  Referring physician: Dr. Eber Hong PCP: [name], MD   Chief Complaint: Right buttock  and leg pain status post fall  HPI:  The patient is a 78 year old female with history of hypothyroidism, congestive heart failure, hypertension, atrial fibrillation -on no chronic anticoagulation, arthritis and has history of sciatica who presents with above complaints. She lives at home, and states that last night she was walking using her walker and as she was going to put up a bottle, she lost balance and fell hitting the right side of her buttock and right hip. She was brought to the ED and x-rays of the hip showed degenerative changes in the lumbar spine and right hip no displaced pelvic or hip fractures identified, lumbar spine x-rays also showed no displaced fractures and a CT scan of her pelvis was done as well and showed no acute displaced fractures. A chest x-ray came back with no acute infiltrates and her urinalysis was done and came back negative. Per the ED the patient was unable to ambulate and lives with her elderly children, and so is admitted for pain control. Also social worker was consulted in the ED and stated that she would be unable to place patient for several days. The time of my exam patient had complaints of right buttock and leg area pain. Denies chest pain, shortness of breath, nausea vomiting, leg swelling, dizziness, focal weakness,diarrhea, fevers, constipation, melena and no hematochezia Review of Systems:  The patient denies anorexia, fever, weight loss,, vision loss, decreased hearing, hoarseness, chest pain, syncope, dyspnea on exertion, peripheral edema, balance deficits, hemoptysis, abdominal pain, melena, hematochezia, severe indigestion/heartburn, hematuria, incontinence, suspicious skin lesions, transient blindness, depression, unusual weight change, abnormal  bleeding.  Past Medical History  Diagnosis Date  . Routine general medical examination at a health care facility   . Other B-complex deficiencies   . Unspecified hypothyroidism   . Diverticulitis of colon (without mention of hemorrhage)   . Congestive heart failure, unspecified   . Stricture and stenosis of esophagus   . Duodenal ulcer, unspecified as acute or chronic, without hemorrhage, perforation, or obstruction   . Atrial fibrillation   . Edema   . Dysphagia, unspecified   . Depressive type psychosis   . Sciatica   . Other constipation   . Unspecified essential hypertension   . Arthritis   . Gallstones   . PONV (postoperative nausea and vomiting)    Past Surgical History  Procedure Date  . Abdominal hysterectomy   . Cholecystectomy    Social History:  reports that she has never smoked. She quit smokeless tobacco use about 48 years ago. Her smokeless tobacco use included Snuff. She reports that she does not drink alcohol or use illicit drugs.  patient lives at home with her older children,   Allergies  Allergen Reactions  . Prednisone Other (See Comments)    Delirium   . Sertraline Hcl Other (See Comments)    Delirium     Family History  Problem Relation Age of Onset  . Cancer Other     breast   . Heart disease Other     Prior to Admission medications   Medication Sig Start Date End Date Taking? Authorizing Provider  albuterol (PROVENTIL HFA;VENTOLIN HFA) 108 (90 BASE) MCG/ACT inhaler Inhale 2 puffs into the lungs every 4 (four) hours as needed. For wheezing and shortness of breath  11/16/10  Yes Casimiro Needle  E Norins, MD  albuterol (PROVENTIL) (2.5 MG/3ML) 0.083% nebulizer solution Take 2.5 mg by nebulization every 6 (six) hours as needed. For wheezing and shortness of breath  11/19/10  Yes Jacques Navy, MD  cloNIDine (CATAPRES) 0.1 MG tablet Take 0.1 mg by mouth 2 (two) times daily.     Yes Historical Provider, MD  cyanocobalamin (,VITAMIN B-12,) 1000 MCG/ML  injection Inject 1,000 mcg into the muscle every 30 (thirty) days.    Yes Historical Provider, MD  diltiazem (CARDIZEM CD) 120 MG 24 hr capsule Take 120 mg by mouth daily.   Yes Historical Provider, MD  furosemide (LASIX) 40 MG tablet Take 40 mg by mouth daily. 10/04/11  Yes Jacques Navy, MD  gabapentin (NEURONTIN) 100 MG capsule Take 100 mg by mouth at bedtime.    Yes Historical Provider, MD  levothyroxine (SYNTHROID, LEVOTHROID) 25 MCG tablet Take 25 mcg by mouth daily. 10/04/11  Yes Jacques Navy, MD  LORazepam (ATIVAN) 0.5 MG tablet Take 1.5 mg by mouth daily. Take 1 tablet in the morning and 2 tablets at bedtime 11/29/11  Yes Jacques Navy, MD  mirtazapine (REMERON) 30 MG tablet Take 30 mg by mouth at bedtime. 10/07/11  Yes Jacques Navy, MD  omeprazole (PRILOSEC) 40 MG capsule Take 40 mg by mouth 2 (two) times daily.   Yes Historical Provider, MD  oxyCODONE-acetaminophen (PERCOCET) 5-325 MG per tablet Take 1 tablet by mouth every 6 (six) hours as needed. FILL ON OR AFTER 11/05/11. FOR PAIN 09/05/11 09/04/12 Yes Jacques Navy, MD  potassium chloride SA (K-DUR,KLOR-CON) 20 MEQ tablet Take 20 mEq by mouth daily. 10/04/11  Yes Jacques Navy, MD  traZODone (DESYREL) 50 MG tablet Take 25 mg by mouth at bedtime. 10/07/11  Yes Jacques Navy, MD  trimethoprim (TRIMPEX) 100 MG tablet Take 100 mg by mouth daily.  04/25/11  Yes Jacques Navy, MD   Physical Exam: Filed Vitals:   01/13/12 0431 01/13/12 0916 01/13/12 1001 01/13/12 1147  BP: 168/63 148/47 163/62 170/68  Pulse: 90  89   Temp:  98.8 F (37.1 C) 98 F (36.7 C)   TempSrc:  Oral    Resp: 19 19 20    Height:   5\' 6"  (1.676 m)   Weight:   48.988 kg (108 lb)   SpO2: 93% 92% 93%    Constitutional: Vital signs reviewed.  Patient is an elderly female well-developed and well-nourished  in no acute distress and cooperative with exam. Alert and oriented x3.  Head: Normocephalic and atraumatic Mouth: no erythema or exudates, moist mucous  membranes Eyes: PERRL, EOMI, conjunctivae normal, No scleral icterus.  Neck: Supple, Trachea midline normal ROM, No JVD, mass, thyromegaly, or carotid bruit present.  Cardiovascular: RRR, S1 normal, S2 normal, no MRG, pulses symmetric and intact bilaterally Pulmonary/Chest: Occasional wheeze, no crackles. Abdominal: Soft. Non-tender, non-distended, bowel sounds are normal, no masses, organomegaly, or guarding present.  Extremities: No cyanosis and no edema, she has a moderate-sized ecchymotic area on on her right elbow  Neurological: A&O x3, Strength is normal and symmetric bilaterally, cranial nerve II-XII are grossly intact, no focal motor deficit, sensory grossly intact Skin: Warm, dry and intact. No rash, cyanosis, or clubbing.  Psychiatric: Normal mood and affect.   Labs on Admission:  Basic Metabolic Panel:  Lab 01/13/12 4782  NA 134*  K 4.1  CL 99  CO2 26  GLUCOSE 122*  BUN 31*  CREATININE 1.51*  CALCIUM 9.5  MG --  PHOS --   Liver Function Tests: No results found for this basename: AST:5,ALT:5,ALKPHOS:5,BILITOT:5,PROT:5,ALBUMIN:5 in the last 168 hours No results found for this basename: LIPASE:5,AMYLASE:5 in the last 168 hours No results found for this basename: AMMONIA:5 in the last 168 hours CBC:  Lab 01/13/12 0005  WBC 6.8  NEUTROABS --  HGB 12.6  HCT 36.8  MCV 91.8  PLT 198   Cardiac Enzymes: No results found for this basename: CKTOTAL:5,CKMB:5,CKMBINDEX:5,TROPONINI:5 in the last 168 hours  BNP (last 3 results)  Basename 05/02/11 1627  PROBNP 695.2*   CBG: No results found for this basename: GLUCAP:5 in the last 168 hours  Radiological Exams on Admission: Dg Chest 1 View  01/13/2012  *RADIOLOGY REPORT*  Clinical Data: Low back pain and right hip pain after fall.  CHEST - 1 VIEW  Comparison: 05/02/2011  Findings: Normal heart size and pulmonary vascularity. Hyperinflation suggesting emphysematous changes.  Fibrosis in the lungs.  No focal airspace  consolidation.  No blunting of costophrenic angles.  No pneumothorax.  Calcification of the aorta. The surgical clips in the right upper quadrant.  Right paratracheal fullness is likely due to supine technique.  IMPRESSION: Emphysematous changes and scattered fibrosis.  No evidence of active pulmonary disease.  Original Report Authenticated By: Marlon Pel, M.D.   Dg Lumbar Spine Complete  01/13/2012  *RADIOLOGY REPORT*  Clinical Data: Low back pain after fall.  LUMBAR SPINE - COMPLETE 4+ VIEW  Comparison: Lumbar spine 01/21/2008.  MRI lumbar spine 03/06/2010.  Findings: Mild lumbar scoliosis convex towards the left.  No abnormal anterior subluxation of the lumbar vertebrae.  Normal alignment of the facet joints.  Diffuse degenerative changes with narrowed lumbar interspaces and endplate hypertrophic changes throughout.  No vertebral compression deformities.  No focal bone lesion or bone destruction.  Visualized bone cortex and trabecular architecture appear intact.  No significant change since previous study.  IMPRESSION: Scoliosis and degenerative changes of the lumbar spine.  Stable appearance since previous study.  No displaced fractures identified.  Original Report Authenticated By: Marlon Pel, M.D.   Dg Hip Complete Right  01/13/2012  *RADIOLOGY REPORT*  Clinical Data: Low back pain and right hip pain after fall.  RIGHT HIP - COMPLETE 2+ VIEW  Comparison: Right femur 10/13/2006  Findings: The pelvis, sacrum, symphysis pubis, hips, and SI joints appear intact.  No displaced fractures identified.  No focal bone lesion or bone destruction.  The right hip demonstrates degenerative changes.  Superior acetabular joint space narrowing and sclerosis.  Hypertrophic changes on the femoral head. Degenerative changes in the lower lumbar spine.  IMPRESSION: Degenerative changes in the lower lumbar spine and right hip.  No displaced pelvic or hip fractures identified.  Original Report Authenticated By:  Marlon Pel, M.D.   Ct Pelvis Wo Contrast  01/13/2012  *RADIOLOGY REPORT*  Clinical Data:  Pain after fall.  CT PELVIS WITHOUT CONTRAST  Technique:  Multidetector CT imaging of the pelvis was performed following the standard protocol without intravenous contrast.  Comparison:  Plain radiographs 01/12/2012.  CT abdomen and pelvis 10/17/2009.  Findings:  Diffuse bone demineralization.  Degenerative changes in the lower lumbar spine.  The pelvis, sacrum, SI joints, symphysis pubis, pubic rami, and hips appear intact.  No evidence of acute fracture or subluxation.  No significant soft tissue hematoma.  No focal bone lesion or bone destruction.  Bone cortex and trabecular architecture appear intact.  IMPRESSION: No acute displaced fractures demonstrated in the pelvis, sacrum, or hips.  Original  Report Authenticated By: Marlon Pel, M.D.      Assessment/Plan Active Problems:  S/P Fall with Hip pain &Back pain-mechanical fall -As discussed above, imaging studies done in ED negative for fractures. -Pain management with analgesics -Obtain a TSH, and follow. She is on vitamin B 12 q. monthly we'll continue -PTOT consult, also was social work consult for possible placement.  HYPERTENSION -Continue outpatient medications, monitor and further treat accordingly.  Unspecified hypothyroidism -Continue Synthroid, recheck TSH as above and follow. History of congestive heart failure -Compensated, we'll continue outpatient medications and monitor fluid status. History of atrial fibrillation -Rate controlled, continue outpatient medications -She is on chronic anticoagulation possibly due to fall risk Chronic renal insufficiency -Stable,Creatinine today is 1.5, which is improved from her last creatinine of 1.87 in Nov 2012  Her primary care physician is Dr. Arthur Holms and will take over her care beginning in the a.m as discussed with him.      Code Status: FULL CODE  Kela Millin [facility] Pager [phone]  If 7PM-7AM, please contact night-coverage [email or web address] Password [facility] 01/13/2012, 12:18 PM

## 2012-01-13 NOTE — Progress Notes (Signed)
Pt is unable to transfer to [facility] this afternoon as pt was unable to receive a PASSRR number today; however information was provided that stated that the pt will most likely have a temporary PASSRR number by the morning of 01/14/12. ED CSW covering, has contacted Bjorn Loser at [facility] to update her and has assured this CSW that the pt's bed will be held overnight. ED CSW has contacted pt's daughter, Lucile Shutters ([phone]) to discuss the pt's pending transfer in the morning. ED CSW also prepared Steward Drone for the possibility of the pt discharging home with home health (peding a review by Case Management) in the event that the pt will require a Level II PASSRR number which can take 7-10 days at time. Steward Drone verbalized understanding and was in agreement with this plan if the pt was unable to transfer to [facility] in a timely way. Steward Drone requests that the oncoming CSW contact her the morning of 01/14/12 to update her on the pt's pending transfer to [facility].   ED CSW has contacted pt's RN to update on the plan for transfer at this time.   At time of discharge/transfer, Sonny Dandy will require a discharge summary with discharge medications indicated.   Consult back to ED CSW as needed.     [place] Crane, MSW, [facility] Clinical Social Worker [phone]

## 2012-01-13 NOTE — Evaluation (Signed)
Occupational Therapy Evaluation Patient Details Name: [name] [name] MRN: [id number] DOB: Dec 02, 1916 Today's Date: 01/13/2012 Time: 0865-7846 OT Time Calculation (min): 21 min  OT Assessment / Plan / Recommendation Clinical Impression  This 78 year old female lives with her daughter and performs basic ADLs and light IADLs at modified independent level.  She is now overall min A with decreased actvitiy tolerance due to pain.  She will benefit from continued OT in the acute setting.      OT Assessment  Patient needs continued OT Services    Follow Up Recommendations  Skilled nursing facility    Barriers to Discharge      Equipment Recommendations  Defer to next venue    Recommendations for Other Services    Frequency  Min 2X/week    Precautions / Restrictions Precautions Precautions: Fall Restrictions Weight Bearing Restrictions: No   Pertinent Vitals/Pain C/o LLE pain especially when weightbearing--settled down once repositioned in chair.  Did not want to request pain meds    ADL  Eating/Feeding: Simulated;Independent Where Assessed - Eating/Feeding: Chair Grooming: Simulated;Set up Where Assessed - Grooming: Unsupported sitting Upper Body Bathing: Simulated;Set up Where Assessed - Upper Body Bathing: Unsupported sitting Lower Body Bathing: Simulated;Minimal assistance Where Assessed - Lower Body Bathing: Supported sit to stand Upper Body Dressing: Simulated;Set up Where Assessed - Upper Body Dressing: Unsupported sitting Lower Body Dressing: Simulated;Minimal assistance Where Assessed - Lower Body Dressing: Supported sit to stand Toilet Transfer: Simulated;Minimal assistance (ambulate in room to recliner) Toileting - Clothing Manipulation and Hygiene: Simulated;Minimal assistance Where Assessed - Toileting Clothing Manipulation and Hygiene: Sit to stand from 3-in-1 or toilet Equipment Used: Rolling walker Transfers/Ambulation Related to ADLs: Pt with increased pain  when walking/weightbearing on LLE.  Was shaking due to being cold.  Stopped when blankets given.  Wheezy when ambulating:  sats 94% once her fingers were warm enough to take reading ADL Comments: able to cross legs for ADLS.  Min A for balance.  Decreased activity tolerance.    OT Diagnosis: Generalized weakness  OT Problem List: Decreased strength;Decreased activity tolerance;Impaired balance (sitting and/or standing);Pain OT Treatment Interventions: Self-care/ADL training;Therapeutic activities;Balance training;Patient/family education;DME and/or AE instruction   OT Goals Acute Rehab OT Goals OT Goal Formulation: With patient Time For Goal Achievement: 01/27/12 Potential to Achieve Goals: Good ADL Goals Pt Will Perform Grooming: with supervision;Standing at sink ADL Goal: Grooming - Progress: Progressing toward goals Pt Will Perform Lower Body Bathing: with supervision;Sit to stand from chair ADL Goal: Lower Body Bathing - Progress: Goal set today Pt Will Perform Lower Body Dressing: with supervision;Sit to stand from chair ADL Goal: Lower Body Dressing - Progress: Progressing toward goals Pt Will Transfer to Toilet: with supervision;3-in-1;Ambulation all aspects ADL Goal: Toilet Transfer - Progress: Goal set today  Visit Information  Last OT Received On: 01/13/12 Assistance Needed: +1 PT/OT Co-Evaluation/Treatment: Yes    Subjective Data  Subjective: "It hurts so bad (LLE during weightbearing" Patient Stated Goal: none stated.  Agreeable to OT/Pt   Prior Functioning  Vision/Perception  Home Living Lives With: Daughter Available Help at Discharge:  (Pt states daughter is not in good health) Type of Home: House Home Access: Level entry Home Layout: One level Bathroom Shower/Tub: Health visitor:  (3:1) Home Adaptive Equipment: Grab bars in shower;Bedside commode/3-in-1;Walker - rolling;Shower chair with back Prior Function Level of Independence: Independent  with assistive device(s) Able to Take Stairs?: No Driving: No Vocation: Retired Musician: Licensed conveyancer  Overall Cognitive Status: Appears within functional limits for tasks assessed/performed Arousal/Alertness: Awake/alert Orientation Level: Appears intact for tasks assessed Behavior During Session: [facility] for tasks performed Cognition - Other Comments: Noted some short term memory deficits during session with pt repeating statements.     Extremity/Trunk Assessment Right Upper Extremity Assessment RUE ROM/Strength/Tone: [facility] for tasks assessed Left Upper Extremity Assessment LUE ROM/Strength/Tone: WFL for tasks assessed Trunk Assessment Trunk Assessment: Kyphotic   Mobility Bed Mobility Bed Mobility: Supine to Sit Supine to Sit: 4: Min assist;3: Mod assist;HOB elevated Details for Bed Mobility Assistance: Requires little assist for LEs to EOB and some assist to steady trunk to get into sitting.  cues for safety and technique.  Transfers Sit to Stand: 4: Min assist;With upper extremity assist;From bed Stand to Sit: 4: Min assist;With upper extremity assist;With armrests;To chair/3-in-1 Details for Transfer Assistance: Min assist for steadying with cues for hand placement and safety when sitting/standing.    Exercise    Balance Balance Balance Assessed: Yes Static Standing Balance Static Standing - Balance Support: Bilateral upper extremity supported Static Standing - Level of Assistance: 4: Min assist Static Standing - Comment/# of Minutes: Noted pt to be very shaky when in standing, however pt stated she was very cold and shaking from that.   End of Session OT - End of Session Activity Tolerance: Patient limited by fatigue;Patient limited by pain Patient left: in chair;with call bell/phone within reach Nurse Communication: Mobility status  GO Functional Assessment Tool Used: clinical judgment Functional Limitation: Self care Self Care Current Status  (Z6109): At least 20 percent but less than 40 percent impaired, limited or restricted Self Care Goal Status (U0454): At least 1 percent but less than 20 percent impaired, limited or restricted   [facility] 01/13/2012, 2:34 PM Marica Otter, OTR/L [phone] 01/13/2012

## 2012-01-13 NOTE — Progress Notes (Addendum)
Patient's information sent out via CFP to facilities in the area. Met with patient's daughter and patient bedside to offer support.  Discussed in detail process of bed finding. Patient will have PT/OT consult for recommendations.  Pending placement. CSW will continue to follow patient.  Manson Passey Wenceslao Loper ANN S , MSW, LCSWA 01/13/2012 9:27 AM [phone]  Pending receipt of [facility] number. Patient has been offered bed at [facility] which family has accepted. Will complete paperwork later today. Patient will not be transferred until [facility] number is issued. Please contact this CSW with questions.  Manson Passey Kiarra Kidd ANN S , MSW, LCSWA 01/13/2012 11:51 AM [phone]

## 2012-01-13 NOTE — Evaluation (Signed)
Physical Therapy Evaluation Patient Details Name: [name] [name] MRN: [id number] DOB: [birth year] Today's Date: 01/13/2012 Time: 4098-1191 PT Time Calculation (min): 21 min  PT Assessment / Plan / Recommendation Clinical Impression  Pt presents s/p fall at home on R hip and back trying to open water bottle.  Tolerated some steps from bed to doorway with RW, however pt with increased c/o pain with very antalgic gait pattern.  Pt will benefit from skilled PT in acute venue to address deficits.  Pt states that she lives at home with daughter, however she is not in good health.  PT recommends SNF for follow up therapy at D/C in order to increase pt safety and decrease burden of care.       PT Assessment  Patient needs continued PT services    Follow Up Recommendations  Skilled nursing facility;Supervision/Assistance - 24 hour    Barriers to Discharge Decreased caregiver support      Equipment Recommendations  Defer to next venue    Recommendations for Other Services     Frequency Min 3X/week    Precautions / Restrictions Precautions Precautions: Fall Restrictions Weight Bearing Restrictions: No   Pertinent Vitals/Pain 8/10 per "faces"       Mobility  Bed Mobility Bed Mobility: Supine to Sit Supine to Sit: 4: Min assist;3: Mod assist;HOB elevated Details for Bed Mobility Assistance: Requires little assist for LEs to EOB and some assist to steady trunk to get into sitting.  cues for safety and technique.  Transfers Transfers: Sit to Stand;Stand to Sit Sit to Stand: 4: Min assist;With upper extremity assist;From bed Stand to Sit: 4: Min assist;With upper extremity assist;With armrests;To chair/3-in-1 Details for Transfer Assistance: Min assist for steadying with cues for hand placement and safety when sitting/standing.  Ambulation/Gait Ambulation/Gait Assistance: 4: Min assist Ambulation Distance (Feet): 10 Feet Assistive device: Rolling walker Ambulation/Gait Assistance  Details: Cues for sequencing/technique with RW, increased step length on LLE and for upright posture.  Gait Pattern: Antalgic;Step-to pattern;Decreased stride length;Trunk flexed Gait velocity: decreased  General Gait Details: Painful for pt to ambulate.  Stairs: No Wheelchair Mobility Wheelchair Mobility: No    Exercises     PT Diagnosis: Abnormality of gait;Generalized weakness;Acute pain  PT Problem List: Decreased strength;Decreased activity tolerance;Decreased balance;Decreased mobility;Decreased knowledge of use of DME;Pain PT Treatment Interventions: DME instruction;Gait training;Functional mobility training;Therapeutic activities;Therapeutic exercise;Balance training;Patient/family education   PT Goals Acute Rehab PT Goals PT Goal Formulation: With patient Time For Goal Achievement: 01/27/12 Potential to Achieve Goals: Good Pt will go Supine/Side to Sit: with supervision PT Goal: Supine/Side to Sit - Progress: Goal set today Pt will go Sit to Supine/Side: with supervision PT Goal: Sit to Supine/Side - Progress: Goal set today Pt will go Sit to Stand: with supervision PT Goal: Sit to Stand - Progress: Goal set today Pt will go Stand to Sit: with supervision PT Goal: Stand to Sit - Progress: Goal set today Pt will Ambulate: 51 - 150 feet;with supervision;with least restrictive assistive device PT Goal: Ambulate - Progress: Goal set today  Visit Information  Last PT Received On: 01/13/12 Assistance Needed: +1 PT/OT Co-Evaluation/Treatment: Yes    Subjective Data  Subjective: It hurts so bad.  Patient Stated Goal: n/a   Prior Functioning  Home Living Lives With: Daughter Available Help at Discharge:  (Pt states daughter is not in good health) Type of Home: House Home Access: Level entry Home Layout: One level Bathroom Shower/Tub: Health visitor:  (3:1) Home Adaptive Equipment: Grab  bars in shower;Bedside commode/3-in-1;Walker - rolling;Shower chair  with back Prior Function Level of Independence: Independent with assistive device(s) Able to Take Stairs?: No Driving: No Vocation: Retired Musician: Clinical cytogeneticist  Overall Cognitive Status: Appears within functional limits for tasks assessed/performed Arousal/Alertness: Awake/alert Orientation Level: Appears intact for tasks assessed Behavior During Session: [facility] for tasks performed Cognition - Other Comments: Noted some short term memory deficits during session with pt repeating statements.     Extremity/Trunk Assessment Right Lower Extremity Assessment RLE ROM/Strength/Tone: Unable to fully assess;Due to pain;Deficits RLE ROM/Strength/Tone Deficits: Pt able to functionally flex, ER and abd hip in order to reach foot RLE Coordination: WFL - gross motor Left Lower Extremity Assessment LLE ROM/Strength/Tone: [facility] for tasks assessed;Deficits LLE ROM/Strength/Tone Deficits: Pt able to functionally flex, ER, and abd hip in order to reach foot.  LLE Coordination: WFL - gross motor Trunk Assessment Trunk Assessment: Kyphotic   Balance Balance Balance Assessed: Yes Static Standing Balance Static Standing - Balance Support: Bilateral upper extremity supported Static Standing - Level of Assistance: 4: Min assist Static Standing - Comment/# of Minutes: Noted pt to be very shaky when in standing, however pt stated she was very cold and shaking from that.   End of Session PT - End of Session Activity Tolerance: Patient limited by pain Patient left: in chair;with call bell/phone within reach Nurse Communication: Mobility status  GP Functional Assessment Tool Used: clinical judgement Functional Limitation: Mobility: Walking and moving around Mobility: Walking and Moving Around Current Status (Z6109): At least 20 percent but less than 40 percent impaired, limited or restricted Mobility: Walking and Moving Around Goal Status [phone]): At least 1 percent but less than 20  percent impaired, limited or restricted   Lessie Dings 01/13/2012, 2:18 PM

## 2012-01-13 NOTE — Plan of Care (Signed)
Problem: Phase I Progression Outcomes Goal: Initial discharge plan identified Outcome: Completed/Met Date Met:  01/13/12 [place] 01/14/12.

## 2012-01-14 DIAGNOSIS — Z0279 Encounter for issue of other medical certificate: Secondary | ICD-10-CM

## 2012-01-14 DIAGNOSIS — I10 Essential (primary) hypertension: Secondary | ICD-10-CM

## 2012-01-14 LAB — BASIC METABOLIC PANEL
BUN: 24 mg/dL — ABNORMAL HIGH (ref 6–23)
Chloride: 102 mEq/L (ref 96–112)
GFR calc non Af Amer: 33 mL/min — ABNORMAL LOW (ref >90)

## 2012-01-14 NOTE — Progress Notes (Addendum)
Physical Therapy Treatment Patient Details Name: [name] [name] MRN: [id number] DOB: 18-Jan-1917 Today's Date: 01/14/2012 Time: 6213-0865 PT Time Calculation (min): 19 min  PT Assessment / Plan / Recommendation Comments on Treatment Session  pt tolerated short ambulation today, limited by hip pain during weight bearing.    Follow Up Recommendations  Skilled nursing facility    Barriers to Discharge        Equipment Recommendations  Defer to next venue    Recommendations for Other Services    Frequency Min 3X/week   Plan Discharge plan remains appropriate;Frequency remains appropriate    Precautions / Restrictions Precautions Precautions: Fall   Pertinent Vitals/Pain R hip not rated.    Mobility  Bed Mobility Bed Mobility: Supine to Sit Supine to Sit: 4: Min assist Details for Bed Mobility Assistance: requires some trunk assit to get to upright, then scoots to edge w/ no assistance Transfers Transfers: Sit to Stand;Stand to Sit Sit to Stand: 4: Min assist;From bed;From chair/3-in-1;With upper extremity assist Stand to Sit: To chair/3-in-1;With upper extremity assist Details for Transfer Assistance: VC for hand placement, turn to square to chair. Ambulation/Gait Ambulation/Gait Assistance: 4: Min assist Ambulation Distance (Feet): 10 Feet (x2) Assistive device: Rolling walker Ambulation/Gait Assistance Details: cues for sequencing and not letting go of RW to soon. Gait Pattern: Antalgic;Step-to pattern;Decreased stance time - right Gait velocity: decreased    Exercises     PT Diagnosis:    PT Problem List:   PT Treatment Interventions:     PT Goals Acute Rehab PT Goals Pt will go Supine/Side to Sit: with supervision PT Goal: Supine/Side to Sit - Progress: Progressing toward goal Pt will go Sit to Stand: with supervision PT Goal: Sit to Stand - Progress: Progressing toward goal Pt will go Stand to Sit: with supervision PT Goal: Stand to Sit - Progress:  Progressing toward goal Pt will Ambulate: 51 - 150 feet;with least restrictive assistive device;with supervision PT Goal: Ambulate - Progress: Progressing toward goal  Visit Information  Last PT Received On: 01/14/12 Assistance Needed: +1    Subjective Data  Subjective: My hip hurts   Cognition  Overall Cognitive Status: Appears within functional limits for tasks assessed/performed Arousal/Alertness: Awake/alert Orientation Level: Appears intact for tasks assessed Behavior During Session: [facility] for tasks performed    Balance  Balance Balance Assessed: Yes Static Sitting Balance Static Sitting - Balance Support: No upper extremity supported;Feet unsupported Static Sitting - Level of Assistance: 5: Stand by assistance  End of Session PT - End of Session Activity Tolerance: Patient limited by pain Patient left: in chair;with call bell/phone within reach Nurse Communication: Mobility status   GP at least 1% and less than 20%     Rada Hay 01/14/2012, 1:48 PM

## 2012-01-14 NOTE — Progress Notes (Signed)
Subjective: Admitted with backand leg pain after a fall. She was unable to manage ADLs at home after fall.  Objective: Lab: Lab Results  Component Value Date   WBC 6.8 01/13/2012   HGB 12.6 01/13/2012   HCT 36.8 01/13/2012   MCV 91.8 01/13/2012   PLT 198 01/13/2012   BMET    Component Value Date/Time   NA 139 01/14/2012 0335   K 3.9 01/14/2012 0335   CL 102 01/14/2012 0335   CO2 30 01/14/2012 0335   GLUCOSE 128* 01/14/2012 0335   BUN 24* 01/14/2012 0335   CREATININE 1.35* 01/14/2012 0335   CALCIUM 9.1 01/14/2012 0335   GFRNONAA 33* 01/14/2012 0335   GFRAA 38* 01/14/2012 0335     Imaging: reviewed all imaging reports - no fractures.  Scheduled Meds:   . cloNIDine  0.1 mg Oral BID  . cyanocobalamin  1,000 mcg Intramuscular Q30 days  . diltiazem  120 mg Oral Daily  . enoxaparin (LOVENOX) injection  40 mg Subcutaneous Q24H  . furosemide  40 mg Oral Daily  . gabapentin  100 mg Oral QHS  . levothyroxine  25 mcg Oral Daily  . LORazepam  0.5 mg Oral Daily  . LORazepam  1 mg Oral QHS  . mirtazapine  30 mg Oral QHS  . pantoprazole  40 mg Oral Q1200  . potassium chloride SA  20 mEq Oral Daily  . traZODone  25 mg Oral QHS  . trimethoprim  100 mg Oral Daily  . tuberculin  5 Units Intradermal Once  . DISCONTD: cloNIDine  0.1 mg Oral Daily  . DISCONTD: LORazepam  1.5 mg Oral Daily  . DISCONTD: sodium chloride  3 mL Intravenous Q12H  . DISCONTD: traZODone  25 mg Oral QHS   Continuous Infusions:  PRN Meds:.acetaminophen, acetaminophen, albuterol, levalbuterol, morphine, ondansetron (ZOFRAN) IV, ondansetron (ZOFRAN) IV, ondansetron, oxyCODONE-acetaminophen, DISCONTD: sodium chloride, DISCONTD: morphine, DISCONTD: sodium chloride   Physical Exam: Filed Vitals:   01/14/12 0635  BP: 149/73  Pulse: 77  Temp: 97.8 F (36.6 C)  Resp: 18   Gen'l - appears more frail than when seen last HEENT - edentulous, temporal wasting noted Cor- RRR Pulm - normal respirations Abd- BS+, soft MSK -  discomfort with flexion of the right leg, discomfort with internal rotation.      Assessment/Plan: 1. MSK - continue pain in the right proximal leg. PT has seen patient and recommends SNF for 24 hr care and rehab  Plan for SNF  2. HTN-  BP Readings from Last 3 Encounters:  01/14/12 149/73  05/13/11 122/68  05/02/11 122/50   OK BP control on present medications  3. Thryroid - TSH 3.7 - normal  4. CHF - h/o CHF but no signs of decompensation or fluid overload.  Plan Continue home meds and follow daily weights  5. A. Fib -  On exam HR seems regular vs rate controlled.  6. CKD - stable  7. Code Status - office chart reviewed: per June '12 note - limited code: No CPR, mechanical vent, defibrillation.  Dispo - for SNF   [name] 01/14/2012, 7:58 AM

## 2012-01-14 NOTE — Progress Notes (Signed)
Called report to Joaquin Music from [facility].  She verbalized understanding.  Will call [facility] to transport.

## 2012-01-14 NOTE — Progress Notes (Addendum)
CSW obtained PAS[facility] number and patient is cleared to d/c to [facility] [facility]. Called patient's sister and advised her of transfer. CSW contacted patient's PCP and requested d/c summary. Patient will be transferred today upon completion of d/c summary.  Manson Passey Latora Quarry ANN S , MSW, LCSWA 01/14/2012 10:54 AM [phone]  CSW recvd call from patient's PCP advising that discharge summary is in progress. [facility] and spoke with admission advising them that we are waiting for d/c summary to be transcribed.  [facility] advised they can take patient now, and discharge summary can be faxed later. CSW spoke with patient and discussed her pending trf to [place]. Patient expressed excitement being able to see her sister.  CSW called patient's daughter and notified her of patient's transfer.  CSW placed trf information in packet and placed in [place]. Patient's nurse notified to call report ([phone]) and CSW called [facility] for transportation.  CSW will continue to remain available should the need arise in patient's trf to facility.  Manson Passey Kaeo Jacome ANN S , MSW, LCSWA 01/14/2012 1:49 PM [phone]

## 2012-01-14 NOTE — Progress Notes (Signed)
notifieid of bed offer and completion of passar. OK for transfer -   Dictation # [id number]  Done 12:55

## 2012-01-15 ENCOUNTER — Encounter: Payer: Medicare Other | Admitting: Internal Medicine

## 2012-01-15 NOTE — Discharge Summary (Signed)
NAMEMarland [name]  SAMAYRA, HEBEL NO.:  [id number]  MEDICAL RECORD NO.:  [id number]  LOCATION:  1333                         FACILITY:  [facility]  PHYSICIAN:  Rosalyn Gess. Junette Bernat, MD  DATE OF BIRTH:  1916/09/02  DATE OF ADMISSION:  01/12/2012 DATE OF DISCHARGE:  01/14/2012                              DISCHARGE SUMMARY   ADMITTING DIAGNOSES: 1. Status post fall with hip and back pain, inability to ambulate     independently. 2. Hypertension. 3. Unspecified hypothyroid disease. 4. History of congestive heart failure. 5. History of atrial fibrillation. 6. History of chronic kidney disease.  DISCHARGE DIAGNOSES: 1. Status post fall with hip and back pain, inability to ambulate     independently. 2. Hypertension. 3. Unspecified hypothyroid disease. 4. History of congestive heart failure. 5. History of atrial fibrillation. 6. History of chronic kidney disease.  CONSULTANTS:  None.  PROCEDURES: 1. Diagnostic hip right which showed degenerative changes in the lower     lumbar spine in the right hip with no displaced pelvic or hip     fractures noted. 2. Lumbar spine films day of admission which showed scoliosis and     degenerative changes of the lumbar spine.  Stable appearance since     previous study.  No displaced fractures identified. 3. Chest x-ray performed January 13, 2012, which showed emphysematous     changes and scattered fibrosis.  No evidence of active pulmonary     disease. 4. CT scan of the pelvis without contrast, performed January 13, 2012,     which showed no acute displaced fracture in the pelvis, sacrum, or     hips.  HISTORY OF PRESENT ILLNESS:  [name] is a 78 year old woman who has been living at home with her daughter and has been maintaining a stable level of health for her age.  She is followed for hypothyroid disease, history of congestive heart failure, hypertension, atrial fibrillation, arthritis, and history of sciatica.  On the night prior  to admission, the patient was walking using her walker, and as she is going to put a bottle a follow up, she lost her balance and fell hitting the right side of her buttock and right hip.  Because of immediate pain and inability to ambulate, she was brought to the emergency department where imaging studies, noted above, reveal no sign of fracture or dislocation. Chest x-ray was unremarkable.  Urinalysis was negative.  Because of her inability to ambulate, it was felt the patient would be a good candidate for placement in skilled care.  Disposition could not be arranged from the emergency department.  Thus the patient was admitted for care.  Please see the H and P for past medical history, family history, social history, as well as admission examination.  HOSPITAL COURSE:  The patient was admitted to a regular bed.  She was seen and evaluated by Physical Therapy who felt the patient needed 24 hour supervision and rehab.  Skilled care was recommended.  1. On examination, the patient's pain was minimal. 2. Hypertension.  Patient's blood pressure readings have been stable     while in hospital.  She should continue on her home medications     without  change. 3. Hypothyroid disease.  TSH at admission was normal range at 3.7.     She will continue on her present medications. 4. CHF.  The patient with no signs of decompensation with no increased     work of breathing.  No rales or wheezes on examination.  She will     need to continue her home medications and have daily weights. 5. Atrial fibrillation.  On examination the patient's heart appears to     be either regular or very well controlled in terms of rate.  She is     not a candidate for anticoagulation therapy because of increased     risk of falling. 6. CKD.  The patient has stable creatinine which was 1.51 at     admission.  Her baseline is 1.7-1.8 and on the day of discharge,     her creatinine was 1.35, and this problem is  considered stable. 7. Code status.  Per the patient's office notes as well as the     patient's family wishes she is not a candidate for cardiac or     pulmonary resuscitation, mechanical ventilation, or defibrillation.  With the patient being thoroughly evaluated with lab work being normal with her vital signs being stable with her being hemodynamically stable. At this point, she is stable for transfer to a skilled care facility.  DISCHARGE EXAMINATION:  VITAL SIGNS:  Temperature 97.8, blood pressure 149/73, pulse was 77, respirations 18, oxygen saturations 93% on room air. GENERAL APPEARANCE:  This is a pleasant elderly woman who appears more frail than when last seen in the office in June.  She does have bilateral temporal wasting noted. HEENT:  Significant for patient being edentulous.  Conjunctivae and sclerae were clear. NECK:  Supple. CARDIOVASCULAR:  2+ radial pulses.  No JVD or carotid bruits.  Her heart rate was irregular versus very well rate controlled. PULMONARY:  The patient with normal respirations without increased work of breathing.  No wheezes or rales were appreciated. ABDOMEN:  Bowel sounds are positive in all 4 quadrants.  Soft. MUSCULOSKELETAL:  The patient had good range of motion of the right hip. The patient had some discomfort with flexion and internal rotation of right hip, but not severe.  There is no rotation of the leg. NEURO:  Patient is awake, she recognizes the examiner.  She has no focal deficits on limited examination.  LABORATORY DATA:  Final laboratory, from the day of discharge, sodium 139, potassium 3.9, chloride 102, CO2 of 30, BUN 24, creatinine 1.35, glucose was 120.  Calculated GFR was 38.  Liver functions at admission were normal.  Cardiac enzymes drawn on the January 13, 2012, were negative. On January 13, 2012, hemoglobin was 12.6 g, white count 6800, with a normal differential.  Platelet count was 198,000.  TSH was 3.72.  CONDITION:  The  patient's condition at the time of discharge dictation is stable and ready for transfer to skilled care.     Rosalyn Gess Calen Geister, MD     MEN/MEDQ  D:  01/14/2012  T:  01/15/2012  Job:  [id number]

## 2012-01-22 ENCOUNTER — Other Ambulatory Visit: Payer: Self-pay | Admitting: Internal Medicine

## 2012-03-30 ENCOUNTER — Encounter (HOSPITAL_COMMUNITY): Payer: Self-pay | Admitting: *Deleted

## 2012-03-30 ENCOUNTER — Emergency Department (HOSPITAL_COMMUNITY): Payer: Medicare Other

## 2012-03-30 ENCOUNTER — Inpatient Hospital Stay (HOSPITAL_COMMUNITY)
Admission: EM | Admit: 2012-03-30 | Discharge: 2012-04-01 | DRG: 194 | Disposition: A | Discharge status: Home Health | Payer: Medicare Other | Attending: Internal Medicine | Admitting: Internal Medicine

## 2012-03-30 DIAGNOSIS — N189 Chronic kidney disease, unspecified: Secondary | ICD-10-CM | POA: Diagnosis present

## 2012-03-30 DIAGNOSIS — E039 Hypothyroidism, unspecified: Secondary | ICD-10-CM | POA: Diagnosis present

## 2012-03-30 DIAGNOSIS — K222 Esophageal obstruction: Secondary | ICD-10-CM | POA: Diagnosis present

## 2012-03-30 DIAGNOSIS — I5032 Chronic diastolic (congestive) heart failure: Secondary | ICD-10-CM | POA: Diagnosis present

## 2012-03-30 DIAGNOSIS — I509 Heart failure, unspecified: Secondary | ICD-10-CM | POA: Diagnosis present

## 2012-03-30 DIAGNOSIS — K219 Gastro-esophageal reflux disease without esophagitis: Secondary | ICD-10-CM | POA: Diagnosis present

## 2012-03-30 DIAGNOSIS — Z8711 Personal history of peptic ulcer disease: Secondary | ICD-10-CM

## 2012-03-30 DIAGNOSIS — Z9089 Acquired absence of other organs: Secondary | ICD-10-CM

## 2012-03-30 DIAGNOSIS — J189 Pneumonia, unspecified organism: Principal | ICD-10-CM | POA: Diagnosis present

## 2012-03-30 DIAGNOSIS — F3289 Other specified depressive episodes: Secondary | ICD-10-CM | POA: Diagnosis present

## 2012-03-30 DIAGNOSIS — F411 Generalized anxiety disorder: Secondary | ICD-10-CM | POA: Diagnosis present

## 2012-03-30 DIAGNOSIS — Z888 Allergy status to other drugs, medicaments and biological substances status: Secondary | ICD-10-CM

## 2012-03-30 DIAGNOSIS — I129 Hypertensive chronic kidney disease with stage 1 through stage 4 chronic kidney disease, or unspecified chronic kidney disease: Secondary | ICD-10-CM | POA: Diagnosis present

## 2012-03-30 DIAGNOSIS — Z79899 Other long term (current) drug therapy: Secondary | ICD-10-CM

## 2012-03-30 DIAGNOSIS — F329 Major depressive disorder, single episode, unspecified: Secondary | ICD-10-CM | POA: Diagnosis present

## 2012-03-30 DIAGNOSIS — Z681 Body mass index (BMI) 19 or less, adult: Secondary | ICD-10-CM

## 2012-03-30 DIAGNOSIS — Z9981 Dependence on supplemental oxygen: Secondary | ICD-10-CM

## 2012-03-30 DIAGNOSIS — Z90710 Acquired absence of both cervix and uterus: Secondary | ICD-10-CM

## 2012-03-30 DIAGNOSIS — Z87891 Personal history of nicotine dependence: Secondary | ICD-10-CM

## 2012-03-30 DIAGNOSIS — E871 Hypo-osmolality and hyponatremia: Secondary | ICD-10-CM | POA: Diagnosis present

## 2012-03-30 DIAGNOSIS — I4891 Unspecified atrial fibrillation: Secondary | ICD-10-CM

## 2012-03-30 DIAGNOSIS — M129 Arthropathy, unspecified: Secondary | ICD-10-CM | POA: Diagnosis present

## 2012-03-30 DIAGNOSIS — R609 Edema, unspecified: Secondary | ICD-10-CM

## 2012-03-30 DIAGNOSIS — E46 Unspecified protein-calorie malnutrition: Secondary | ICD-10-CM | POA: Diagnosis present

## 2012-03-30 DIAGNOSIS — R0902 Hypoxemia: Secondary | ICD-10-CM | POA: Diagnosis present

## 2012-03-30 DIAGNOSIS — I10 Essential (primary) hypertension: Secondary | ICD-10-CM | POA: Diagnosis present

## 2012-03-30 DIAGNOSIS — K5909 Other constipation: Secondary | ICD-10-CM | POA: Diagnosis present

## 2012-03-30 LAB — CBC WITH DIFFERENTIAL/PLATELET
Basophils Relative: 1 % (ref 0–1)
Eosinophils Absolute: 0.1 10*3/uL (ref 0.0–0.7)
Eosinophils Relative: 1 % (ref 0–5)
Hemoglobin: 11 g/dL — ABNORMAL LOW (ref 12.0–15.0)
Lymphs Abs: 0.9 10*3/uL (ref 0.7–4.0)
MCH: 31.8 pg (ref 26.0–34.0)
MCHC: 33.6 g/dL (ref 30.0–36.0)
MCV: 94.5 fL (ref 78.0–100.0)
Monocytes Absolute: 0.4 10*3/uL (ref 0.1–1.0)
Monocytes Relative: 8 % (ref 3–12)
Neutrophils Relative %: 75 % (ref 43–77)
RBC: 3.46 MIL/uL — ABNORMAL LOW (ref 3.87–5.11)

## 2012-03-30 LAB — COMPREHENSIVE METABOLIC PANEL
Albumin: 2.9 g/dL — ABNORMAL LOW (ref 3.5–5.2)
Alkaline Phosphatase: 96 U/L (ref 39–117)
BUN: 22 mg/dL (ref 6–23)
Calcium: 8.6 mg/dL (ref 8.4–10.5)
Creatinine, Ser: 1.43 mg/dL — ABNORMAL HIGH (ref 0.50–1.10)
GFR calc Af Amer: 35 mL/min — ABNORMAL LOW (ref >90)
Glucose, Bld: 123 mg/dL — ABNORMAL HIGH (ref 70–99)
Potassium: 4.3 mEq/L (ref 3.5–5.1)
Total Protein: 5.8 g/dL — ABNORMAL LOW (ref 6.0–8.3)

## 2012-03-30 LAB — URINALYSIS, ROUTINE W REFLEX MICROSCOPIC
Glucose, UA: NEGATIVE mg/dL
Ketones, ur: NEGATIVE mg/dL
Nitrite: NEGATIVE
pH: 5.5 (ref 5.0–8.0)

## 2012-03-30 LAB — URINE MICROSCOPIC-ADD ON

## 2012-03-30 LAB — PRO B NATRIURETIC PEPTIDE: Pro B Natriuretic peptide (BNP): 713.8 pg/mL — ABNORMAL HIGH (ref 0–450)

## 2012-03-30 LAB — POCT I-STAT TROPONIN I: Troponin i, poc: 0 ng/mL (ref 0.00–0.08)

## 2012-03-30 LAB — STREP PNEUMONIAE URINARY ANTIGEN: Strep Pneumo Urinary Antigen: NEGATIVE

## 2012-03-30 MED ORDER — VANCOMYCIN HCL IN DEXTROSE 1-5 GM/200ML-% IV SOLN
1000.0000 mg | Freq: Once | INTRAVENOUS | Status: AC
  Administered 2012-03-30: 1000 mg via INTRAVENOUS
  Filled 2012-03-30: qty 200

## 2012-03-30 MED ORDER — TRIMETHOPRIM 100 MG PO TABS
100.0000 mg | ORAL_TABLET | Freq: Every day | ORAL | Status: DC
  Administered 2012-03-31 – 2012-04-01 (×2): 100 mg via ORAL
  Filled 2012-03-30 (×2): qty 1

## 2012-03-30 MED ORDER — POTASSIUM CHLORIDE CRYS ER 20 MEQ PO TBCR
20.0000 meq | EXTENDED_RELEASE_TABLET | Freq: Every day | ORAL | Status: DC
  Administered 2012-03-31 – 2012-04-01 (×2): 20 meq via ORAL
  Filled 2012-03-30 (×2): qty 1

## 2012-03-30 MED ORDER — SODIUM CHLORIDE 0.9 % IJ SOLN
3.0000 mL | Freq: Two times a day (BID) | INTRAMUSCULAR | Status: DC
  Administered 2012-03-31 – 2012-04-01 (×3): 3 mL via INTRAVENOUS

## 2012-03-30 MED ORDER — PIPERACILLIN-TAZOBACTAM 3.375 G IVPB
3.3750 g | Freq: Once | INTRAVENOUS | Status: AC
  Administered 2012-03-30: 3.375 g via INTRAVENOUS
  Filled 2012-03-30: qty 50

## 2012-03-30 MED ORDER — MIRTAZAPINE 30 MG PO TABS
30.0000 mg | ORAL_TABLET | Freq: Every day | ORAL | Status: DC
  Administered 2012-03-30 – 2012-03-31 (×2): 30 mg via ORAL
  Filled 2012-03-30 (×4): qty 1

## 2012-03-30 MED ORDER — LEVOTHYROXINE SODIUM 25 MCG PO TABS
25.0000 ug | ORAL_TABLET | Freq: Every day | ORAL | Status: DC
  Administered 2012-03-31 – 2012-04-01 (×2): 25 ug via ORAL
  Filled 2012-03-30 (×5): qty 1

## 2012-03-30 MED ORDER — GUAIFENESIN-DM 100-10 MG/5ML PO SYRP: 5.0000 mL | ORAL_SOLUTION | ORAL | Status: DC | PRN

## 2012-03-30 MED ORDER — CLONIDINE HCL 0.1 MG PO TABS
0.1000 mg | ORAL_TABLET | Freq: Two times a day (BID) | ORAL | Status: DC
Start: 2012-03-30 — End: 2012-04-01
  Administered 2012-03-31 – 2012-04-01 (×3): 0.1 mg via ORAL
  Filled 2012-03-30 (×5): qty 1

## 2012-03-30 MED ORDER — PANTOPRAZOLE SODIUM 40 MG PO TBEC
40.0000 mg | DELAYED_RELEASE_TABLET | Freq: Every day | ORAL | Status: DC
  Administered 2012-03-31 – 2012-04-01 (×2): 40 mg via ORAL
  Filled 2012-03-30 (×3): qty 1

## 2012-03-30 MED ORDER — FUROSEMIDE 40 MG PO TABS
40.0000 mg | ORAL_TABLET | Freq: Every day | ORAL | Status: DC
  Administered 2012-03-31 – 2012-04-01 (×2): 40 mg via ORAL
  Filled 2012-03-30 (×2): qty 1

## 2012-03-30 MED ORDER — LORAZEPAM 0.5 MG PO TABS
0.5000 mg | ORAL_TABLET | Freq: Two times a day (BID) | ORAL | Status: DC
  Administered 2012-03-31 (×2): 0.5 mg via ORAL
  Filled 2012-03-30 (×2): qty 1

## 2012-03-30 MED ORDER — SODIUM CHLORIDE 0.9 % IV SOLN: INTRAVENOUS | Status: AC

## 2012-03-30 MED ORDER — ACETAMINOPHEN 650 MG RE SUPP: 650.0000 mg | Freq: Four times a day (QID) | RECTAL | Status: DC | PRN

## 2012-03-30 MED ORDER — IPRATROPIUM BROMIDE 0.02 % IN SOLN
0.5000 mg | Freq: Four times a day (QID) | RESPIRATORY_TRACT | Status: DC
  Administered 2012-03-30 – 2012-03-31 (×2): 0.5 mg via RESPIRATORY_TRACT
  Filled 2012-03-30 (×2): qty 2.5

## 2012-03-30 MED ORDER — ACETAMINOPHEN 325 MG PO TABS: 650.0000 mg | ORAL_TABLET | Freq: Four times a day (QID) | ORAL | Status: DC | PRN

## 2012-03-30 MED ORDER — TRAZODONE 25 MG HALF TABLET
25.0000 mg | ORAL_TABLET | Freq: Every day | ORAL | Status: DC
  Administered 2012-03-30 – 2012-03-31 (×2): 25 mg via ORAL
  Filled 2012-03-30 (×3): qty 1

## 2012-03-30 MED ORDER — INFLUENZA VIRUS VACC SPLIT PF IM SUSP
0.5000 mL | INTRAMUSCULAR | Status: DC
  Filled 2012-03-30: qty 0.5

## 2012-03-30 MED ORDER — ALBUTEROL SULFATE (5 MG/ML) 0.5% IN NEBU
2.5000 mg | INHALATION_SOLUTION | Freq: Once | RESPIRATORY_TRACT | Status: AC
  Administered 2012-03-30: 2.5 mg via RESPIRATORY_TRACT
  Filled 2012-03-30: qty 0.5

## 2012-03-30 MED ORDER — OXYCODONE-ACETAMINOPHEN 5-325 MG PO TABS: 1.0000 | ORAL_TABLET | Freq: Four times a day (QID) | ORAL | Status: DC | PRN

## 2012-03-30 MED ORDER — ALBUTEROL SULFATE (5 MG/ML) 0.5% IN NEBU: 2.5000 mg | INHALATION_SOLUTION | Freq: Four times a day (QID) | RESPIRATORY_TRACT | Status: DC | PRN

## 2012-03-30 MED ORDER — DILTIAZEM HCL ER COATED BEADS 120 MG PO CP24
120.0000 mg | ORAL_CAPSULE | Freq: Every day | ORAL | Status: DC
  Administered 2012-03-31 – 2012-04-01 (×2): 120 mg via ORAL
  Filled 2012-03-30 (×2): qty 1

## 2012-03-30 MED ORDER — HYDROMORPHONE HCL PF 1 MG/ML IJ SOLN: 0.5000 mg | INTRAMUSCULAR | Status: DC | PRN

## 2012-03-30 MED ORDER — PIPERACILLIN-TAZOBACTAM IN DEX 2-0.25 GM/50ML IV SOLN
2.2500 g | Freq: Four times a day (QID) | INTRAVENOUS | Status: DC
  Administered 2012-03-30 – 2012-03-31 (×3): 2.25 g via INTRAVENOUS
  Filled 2012-03-30 (×5): qty 50

## 2012-03-30 MED ORDER — ALBUTEROL SULFATE (5 MG/ML) 0.5% IN NEBU
2.5000 mg | INHALATION_SOLUTION | Freq: Four times a day (QID) | RESPIRATORY_TRACT | Status: DC
  Administered 2012-03-30 – 2012-03-31 (×2): 2.5 mg via RESPIRATORY_TRACT
  Filled 2012-03-30 (×2): qty 0.5

## 2012-03-30 MED ORDER — VANCOMYCIN HCL 500 MG IV SOLR: 500.0000 mg | INTRAVENOUS | Status: DC

## 2012-03-30 MED ORDER — GABAPENTIN 100 MG PO CAPS
100.0000 mg | ORAL_CAPSULE | Freq: Every day | ORAL | Status: DC
Start: 2012-03-30 — End: 2012-04-01
  Administered 2012-03-30 – 2012-03-31 (×2): 100 mg via ORAL
  Filled 2012-03-30 (×3): qty 1

## 2012-03-30 NOTE — ED Notes (Signed)
ZOX:[id number]<AV> Expected date:<BR> Expected time:<BR> Means of arrival:<BR> Comments:<BR> ems Sob,

## 2012-03-30 NOTE — H&P (Signed)
[facility] History and Physical  [name] [name]:[id number] DOB: 1916/12/02 DOA: 03/30/2012  Referring physician: Jeraldine Loots PCP: [name], MD   Chief Complaint: cough/weakness  HPI: [name] [name] is a very pleasant 78 y.o. female with hx afib not on chronic anticoagulation, chf, htn, presents to [facility] ED from assisted living with cc cough/weakness/sob. Info from pt and family at bedside. Symptoms started several days ago and have persisted. Associated symptoms include decreased appetite and right sided CP worse with coughing. Denies fever,chills, nausea, vomiting, diarrhea. Reports using oxygen at home on prn basis and this did not help. Reports right sided chest pain described as sharp worse with coughing. Pain non radiating. Daughter reports worsening sob yesterday with increased productive cough. Symptoms characterized as moderate. In ED chest xray concerning for pna. VSS, pt non-toxic. We are asked to admit.    Review of Systems: The patient denies anorexia, fever, weight loss,, vision loss, decreased hearing, hoarseness,  syncope,  hemoptysis, abdominal pain, melena, hematochezia, severe indigestion/heartburn, hematuria, incontinence, genital sores, muscle weakness, suspicious skin lesions, transient blindness,  depression, unusual weight change, abnormal bleeding, enlarged lymph nodes, angioedema, and breast masses.    Past Medical History  Diagnosis Date  . Routine general medical examination at a health care facility   . Other B-complex deficiencies   . Unspecified hypothyroidism   . Diverticulitis of colon (without mention of hemorrhage)   . Congestive heart failure, unspecified   . Stricture and stenosis of esophagus   . Duodenal ulcer, unspecified as acute or chronic, without hemorrhage, perforation, or obstruction   . Atrial fibrillation   . Edema   . Dysphagia, unspecified   . Depressive type psychosis   . Sciatica   . Other constipation   . Unspecified  essential hypertension   . Arthritis   . Gallstones   . PONV (postoperative nausea and vomiting)    Past Surgical History  Procedure Date  . Abdominal hysterectomy   . Cholecystectomy    Social History:  reports that she has never smoked. She quit smokeless tobacco use about 48 years ago. Her smokeless tobacco use included Snuff. She reports that she does not drink alcohol or use illicit drugs. Pt recently moved from home to skilled nursing facility and even more recently to assisted living as not meeting criteria for skilled (per daughter)  Can patient participate in ADLs? Yes uses walker  Allergies  Allergen Reactions  . Prednisone Other (See Comments)    Delirium   . Sertraline Hcl Other (See Comments)    Delirium     Family History  Problem Relation Age of Onset  . Cancer Other     breast   . Heart disease Other    (be sure to complete)  Prior to Admission medications   Medication Sig Start Date End Date Taking? Authorizing Provider  cloNIDine (CATAPRES) 0.1 MG tablet Take 0.1 mg by mouth 2 (two) times daily.    Yes Historical Provider, MD  diltiazem (CARDIZEM CD) 120 MG 24 hr capsule Take 120 mg by mouth daily.   Yes Historical Provider, MD  furosemide (LASIX) 40 MG tablet Take 40 mg by mouth daily. 10/04/11  Yes Jacques Navy, MD  gabapentin (NEURONTIN) 100 MG capsule Take 100 mg by mouth at bedtime.    Yes Historical Provider, MD  levothyroxine (SYNTHROID, LEVOTHROID) 25 MCG tablet TAKE 1 TABLET BY MOUTH ONCE A DAY 01/22/12  Yes Jacques Navy, MD  LORazepam (ATIVAN) 0.5 MG tablet Take 0.5-1 mg  by mouth 2 (two) times daily. Take 0.5 mg every morning and 1 mg at bedtime. 11/29/11  Yes Jacques Navy, MD  mirtazapine (REMERON) 30 MG tablet Take 30 mg by mouth at bedtime. 10/07/11  Yes Jacques Navy, MD  omeprazole (PRILOSEC) 40 MG capsule Take 40 mg by mouth 2 (two) times daily.   Yes Historical Provider, MD  oxyCODONE-acetaminophen (PERCOCET) 5-325 MG per tablet Take  1 tablet by mouth every 6 (six) hours as needed. Pain. 09/05/11 09/04/12 Yes Jacques Navy, MD  potassium chloride SA (K-DUR,KLOR-CON) 20 MEQ tablet Take 20 mEq by mouth daily. 10/04/11  Yes Jacques Navy, MD  traZODone (DESYREL) 50 MG tablet Take 25 mg by mouth at bedtime. 10/07/11  Yes Jacques Navy, MD  trimethoprim (TRIMPEX) 100 MG tablet Take 100 mg by mouth daily.  04/25/11  Yes Jacques Navy, MD   Physical Exam: Filed Vitals:   03/30/12 1356 03/30/12 1404 03/30/12 1454 03/30/12 1517  BP:  124/39  149/57  Pulse:  88  84  Temp:  97.9 F (36.6 C)  97.9 F (36.6 C)  TempSrc:  Oral  Oral  Resp:  20    SpO2: 96% 96% 94% 95%     General:  Awake alert, well nourished somewhat pale/weak apperring  Eyes: PERRL EOMI no scleral icterus  ENT: ears clear, nose without drainage, mucus membranes mouth slightly dry/pale. No exudate   Neck: supple, no JVD  Cardiovascular: rrr no GR trace LEE PPP  Respiratory: normal effort but shallow. BS distant with some rhonchi scattered. No rales  Abdomen: soft, + BS non-tender to palpation  Skin: warm/dry, no lesions/rash  Musculoskeletal: no joint swelling erythema, joints non-tender to palp  Psychiatric: appropriate, cooperative  Neurologic: cranial nerve II-XII intact, speech slow but clear. Oriented x3  Labs on Admission:  Basic Metabolic Panel:  Lab 03/30/12 1610  NA 132*  K 4.3  CL 95*  CO2 28  GLUCOSE 123*  BUN 22  CREATININE 1.43*  CALCIUM 8.6  MG --  PHOS --   Liver Function Tests:  Lab 03/30/12 1457  AST 14  ALT 10  ALKPHOS 96  BILITOT 0.2*  PROT 5.8*  ALBUMIN 2.9*   No results found for this basename: LIPASE:5,AMYLASE:5 in the last 168 hours No results found for this basename: AMMONIA:5 in the last 168 hours CBC:  Lab 03/30/12 1457  WBC 5.7  NEUTROABS 4.2  HGB 11.0*  HCT 32.7*  MCV 94.5  PLT 245   Cardiac Enzymes: No results found for this basename: CKTOTAL:5,CKMB:5,CKMBINDEX:5,TROPONINI:5 in the  last 168 hours  BNP (last 3 results)  Basename 03/30/12 1457 05/02/11 1627  PROBNP 713.8* 695.2*   CBG: No results found for this basename: GLUCAP:5 in the last 168 hours  Radiological Exams on Admission: Dg Chest 2 View  03/30/2012  *RADIOLOGY REPORT*  Clinical Data: Chest pain, cough and fever.  Weakness.  .  CHEST - 2 VIEW  Comparison: 01/12/2012.  Findings: Normal sized heart.  Suggestion of a small amount of airspace opacity in the right upper lung zone, adjacent to a monitor lead.  Otherwise, clear lungs with normal vascularity. Thoracic spine degenerative changes, including changes of DISH. Cholecystectomy clips.  IMPRESSION: Probable mild right upper lobe pneumonia.   Original Report Authenticated By: Darrol Angel, M.D.     EKG: Independently reviewed. nsr  Assessment/Plan  HCAP (healthcare-associated pneumonia): will admit to tele. Pt afebrile, no white count and non-toxic appearing. Will get blood culture  and sputum culture as well as HIV antibody, legionella. Vanc and zosyn started in ED and will continue. 02 support and monitor sats. CBC in am.   Chest pain: suspect related to vigorous coughing. Will monitor on tele, cycle troponin x3 , repeat EKG in am.    HYPERTENSION: controlled. Will continue home meds with parameters. Currently SBP 146. Monitor closely   FIBRILLATION, ATRIAL: rate controlled. Not on anti-coagulation. Continue cardizem. Monitor on tele.    Diastolic CHF, MILD: echo in 2010 yields EF 65% with grade 1 diastolic dysfunction. Pro BNP 713. Does not look volume overloaded at this time.  Will monitor daily wts, strict intake and output. Will continue lasix with parameters.  Will gently hydrate with IV fluids for 12 hours.   Hyponatremia: mild. Likely related to #1. Will gently hydrate. Recheck in am.    ESOPHAGEAL STRICTURE: at baseline  CONSTIPATION, CHRONIC: at baseline continue home meds   Chronic renal insufficiency: creatinine 1.4. Chart review  indicates this in in baseline range. Will monitor closely   Hypoxia: mild. sats 95% on 2L. Lower 90's room air. Will provide 02 support and nebs. Monitor sats.   Anxiety: stable at baseline. Will continue home ativan.   GERD: stable at baseline: continue PPI  Code Status:  Family Communication: Pt and daughter at bedside Disposition Plan: back to assisted living when medically stable.   Time spent: 45 minutes  [name] Bender  NP [facility] If 7PM-7AM, please contact night-coverage [email or web address] Password [email or web address] 03/30/2012, 4:48 PM _______________________________________________________________________________  [name] [name] (DOB July 27, 1916) was seen and examined with [name] Smothers, NP.  I agree with the above History and Physical Exam, Assessment and Care Plan.  Please see orders.  [facility] will admit and take calls for this patient until 7am and then the patient's primary care physician Dr. Debby Bud will assume her care.    [name] Langton, MD (pager [phone]) [facility], Team 2 [facility] [place], [place]

## 2012-03-30 NOTE — ED Notes (Signed)
Received  Pt in rm 24, pt is  is from [facility]; she started having shortness of breath and diff. Breathing yesterday, today her symptoms worsened and she developed occasional cough with clear, white phlegm. Per EMS pt also has BLE edema 3+.

## 2012-03-30 NOTE — ED Notes (Signed)
Urine specimen collected if needed.

## 2012-03-30 NOTE — ED Provider Notes (Signed)
History     CSN: [id number]  Arrival date & time 03/30/12  1354   First MD Initiated Contact with Patient 03/30/12 1530      Chief Complaint  Patient presents with  . Shortness of Breath    (Consider location/radiation/quality/duration/timing/severity/associated sxs/prior treatment) HPI The patient p/w sob /congestion / increasing DOE.  Sx became persistent yesterday, though onset was insidious.  Since onset Sx have been worsening in spite of home therapy (+O2).  She also c/o R-sided CP.  No f, no n/v/d.   Past Medical History  Diagnosis Date  . Routine general medical examination at a health care facility   . Other B-complex deficiencies   . Unspecified hypothyroidism   . Diverticulitis of colon (without mention of hemorrhage)   . Congestive heart failure, unspecified   . Stricture and stenosis of esophagus   . Duodenal ulcer, unspecified as acute or chronic, without hemorrhage, perforation, or obstruction   . Atrial fibrillation   . Edema   . Dysphagia, unspecified   . Depressive type psychosis   . Sciatica   . Other constipation   . Unspecified essential hypertension   . Arthritis   . Gallstones   . PONV (postoperative nausea and vomiting)     Past Surgical History  Procedure Date  . Abdominal hysterectomy   . Cholecystectomy     Family History  Problem Relation Age of Onset  . Cancer Other     breast   . Heart disease Other     History  Substance Use Topics  . Smoking status: Never Smoker   . Smokeless tobacco: Former Neurosurgeon    Types: Snuff    Quit date: 01/13/1964  . Alcohol Use: No    OB History    Grav Para Term Preterm Abortions TAB SAB Ect Mult Living                  Review of Systems  Constitutional:       HPI  HENT:       HPI otherwise negative  Eyes: Negative.   Respiratory:       HPI, otherwise negative  Cardiovascular:       HPI, otherwise nmegative  Gastrointestinal: Negative for vomiting.  Genitourinary:       HPI,  otherwise negative  Musculoskeletal:       HPI, otherwise negative  Skin: Negative.   Neurological: Negative for syncope.    Allergies  Prednisone and Sertraline hcl  Home Medications   Current Outpatient Rx  Name Route Sig Dispense Refill  . CLONIDINE HCL 0.1 MG PO TABS Oral Take 0.1 mg by mouth 2 (two) times daily.     Marland Kitchen DILTIAZEM HCL ER COATED BEADS 120 MG PO CP24 Oral Take 120 mg by mouth daily.    . FUROSEMIDE 40 MG PO TABS Oral Take 40 mg by mouth daily.    Marland Kitchen GABAPENTIN 100 MG PO CAPS Oral Take 100 mg by mouth at bedtime.     Marland Kitchen LEVOTHYROXINE SODIUM 25 MCG PO TABS  TAKE 1 TABLET BY MOUTH ONCE A DAY 30 tablet 6  . LORAZEPAM 0.5 MG PO TABS Oral Take 0.5-1 mg by mouth 2 (two) times daily. Take 0.5 mg every morning and 1 mg at bedtime.    Marland Kitchen MIRTAZAPINE 30 MG PO TABS Oral Take 30 mg by mouth at bedtime.    . OMEPRAZOLE 40 MG PO CPDR Oral Take 40 mg by mouth 2 (two) times daily.    Marland Kitchen  OXYCODONE-ACETAMINOPHEN 5-325 MG PO TABS Oral Take 1 tablet by mouth every 6 (six) hours as needed. Pain.    Marland Kitchen POTASSIUM CHLORIDE CRYS ER 20 MEQ PO TBCR Oral Take 20 mEq by mouth daily.    . TRAZODONE HCL 50 MG PO TABS Oral Take 25 mg by mouth at bedtime.    Marland Kitchen TRIMETHOPRIM 100 MG PO TABS Oral Take 100 mg by mouth daily.       BP 149/57  Pulse 84  Temp 97.9 F (36.6 C) (Oral)  Resp 20  SpO2 95%  Physical Exam  Nursing note and vitals reviewed. Constitutional: She is oriented to person, place, and time. She appears well-developed and well-nourished. No distress.       Elderly F w notable cough  HENT:  Head: Normocephalic and atraumatic.  Eyes: Conjunctivae normal and EOM are normal.  Cardiovascular: Normal rate and regular rhythm.   Murmur heard. Pulmonary/Chest: Effort normal. No stridor. She has no wheezes.       Diminished BS  Abdominal: She exhibits no distension.  Musculoskeletal: She exhibits no edema.  Neurological: She is alert and oriented to person, place, and time. No cranial nerve  deficit.  Skin: Skin is warm and dry.  Psychiatric: She has a normal mood and affect.    ED Course  Procedures (including critical care time)  Labs Reviewed  CBC WITH DIFFERENTIAL - Abnormal; Notable for the following:    RBC 3.46 (*)     Hemoglobin 11.0 (*)     HCT 32.7 (*)     All other components within normal limits  COMPREHENSIVE METABOLIC PANEL - Abnormal; Notable for the following:    Sodium 132 (*)     Chloride 95 (*)     Glucose, Bld 123 (*)     Creatinine, Ser 1.43 (*)     Total Protein 5.8 (*)     Albumin 2.9 (*)     Total Bilirubin 0.2 (*)     GFR calc non Af Amer 31 (*)     GFR calc Af Amer 35 (*)     All other components within normal limits  URINALYSIS, ROUTINE W REFLEX MICROSCOPIC - Abnormal; Notable for the following:    APPearance CLOUDY (*)     Leukocytes, UA SMALL (*)     All other components within normal limits  URINE MICROSCOPIC-ADD ON - Abnormal; Notable for the following:    Bacteria, UA FEW (*)     All other components within normal limits  PRO B NATRIURETIC PEPTIDE   No results found.   No diagnosis found.  Cardiac: 85sr, normal  O2: 93% RA, abn - improves w [place] to 100%   Date: 03/30/2012  Rate: 82  Rhythm: normal sinus rhythm  QRS Axis: normal  Intervals: normal  ST/T Wave abnormalities: normal  Conduction Disutrbances: none  Narrative Interpretation: unremarkable      MDM  This pleasant female presents from her nursing home with concerns of dyspnea, congestion, cough.  On exam the patient is awake and alert, with vital signs notable for mild hypoxia.  Patient is afebrile, and her evaluation is consistent with pneumonia.  The patient's oxygenation improved with nasal cannula.  She was admitted for further evaluation and management.        Gerhard Munch, MD 03/30/12 [id number]

## 2012-03-30 NOTE — ED Notes (Signed)
Report called to Michelle Rn

## 2012-03-30 NOTE — Progress Notes (Addendum)
Clinical Social Work Department BRIEF PSYCHOSOCIAL ASSESSMENT 03/30/2012  Patient:  [name] [name], [name] [name]     Account Number:  [id number]     Admit date:  03/30/2012  Clinical Social Worker:  Doree Albee  Date/Time:  03/30/2012 06:00 PM  Referred by:  RN  Date Referred:  03/30/2012 Referred for  ALF Placement   Other Referral:   Interview type:  Patient Other interview type:   and patient daughter and son in law    PSYCHOSOCIAL DATA Living Status:  FACILITY Admitted from facility:  [facility] [address] Level of care:  Assisted Living Primary support name:  [name] Primary support relationship to patient:  CHILD, ADULT Degree of support available:   strong    CURRENT CONCERNS Current Concerns  Post-Acute Placement   Other Concerns:    SOCIAL WORK ASSESSMENT / PLAN CSW met with pt at bedside to complete psychosocial assessment and  assist with pt dc plans. Per chart review, pt is being admitted as inpatient. At time of assessment pt was resting peacefully in bed, waiting for pt family to return from the restroom. Pt is alert and oriented x4.    Pt shared with CSW that she is a resident at [facility] living. Pt states that pt daughter and pt son in law assist with pt needs as well. Pt gave CSW permission to discuss pt discharge plans with pt daughter and son in law.    Pt daughter and pt son in law had stepped out of pt room momentarily. CSW spoke with pt family outside of room after being identified by pt RN.    Pt daughter informed csw that patient was recently in [facility] for a previous fall and fracture. Pt had just been admitted to [facility]F on Friday 03/27/2012.    Pt family is hopeful for patient to be able to return to [facility] place when medically stable. Pt family verbalized understanding that during pt hosptialization pt will be evaluated to determine if pt will be able to return to alf or if pt will  need high level of care.   Assessment/plan status:  Psychosocial Support/Ongoing Assessment of Needs Other assessment/ plan:   Information/referral to community resources:   None identified at this time    PATIENT'S/FAMILY'S RESPONSE TO PLAN OF CARE: Pt and pt family thanked csw for concern and support. Pt and pt family hope for pt to be able to return to [facility] place when medically stable. Pt and pt family motivated to work with unit csw to assist with pt dc plans. Pt family asked for csw to contact facility and pt family will as well.    Catha Gosselin, LCSWA  [phone] .03/30/2012 1630

## 2012-03-30 NOTE — Progress Notes (Addendum)
ANTIBIOTIC CONSULT NOTE - INITIAL  Pharmacy Consult for Vancomycin/Zosyn  Indication: HCAP  Allergies  Allergen Reactions  . Prednisone Other (See Comments)    Delirium   . Sertraline Hcl Other (See Comments)    Delirium     Patient Measurements:   Vital Signs: Temp: 97.9 F (36.6 C) (09/30 1517) Temp src: Oral (09/30 1517) BP: 149/57 mmHg (09/30 1517) Pulse Rate: 84  (09/30 1517) Intake/Output from previous day:   Intake/Output from this shift: Total I/O In: -  Out: 300 [Urine:300]  Labs:  [facility] 03/30/12 1457  WBC 5.7  HGB 11.0*  PLT 245  LABCREA --  CREATININE 1.43*   The CrCl is unknown because both a height and weight (above a minimum accepted value) are required for this calculation. No results found for this basename: VANCOTROUGH:2,VANCOPEAK:2,VANCORANDOM:2,GENTTROUGH:2,GENTPEAK:2,GENTRANDOM:2,TOBRATROUGH:2,TOBRAPEAK:2,TOBRARND:2,AMIKACINPEAK:2,AMIKACINTROU:2,AMIKACIN:2, in the last 72 hours   Microbiology: No results found for this or any previous visit (from the past 720 hour(s)).  Medical History: Past Medical History  Diagnosis Date  . Routine general medical examination at a health care facility   . Other B-complex deficiencies   . Unspecified hypothyroidism   . Diverticulitis of colon (without mention of hemorrhage)   . Congestive heart failure, unspecified   . Stricture and stenosis of esophagus   . Duodenal ulcer, unspecified as acute or chronic, without hemorrhage, perforation, or obstruction   . Atrial fibrillation   . Edema   . Dysphagia, unspecified   . Depressive type psychosis   . Sciatica   . Other constipation   . Unspecified essential hypertension   . Arthritis   . Gallstones   . PONV (postoperative nausea and vomiting)     Medications:  Scheduled:    . albuterol  2.5 mg Nebulization Once  . albuterol  2.5 mg Nebulization Q6H  . cloNIDine  0.1 mg Oral BID  . diltiazem  120 mg Oral Daily  . furosemide  40 mg Oral Daily    . gabapentin  100 mg Oral QHS  . influenza  inactive virus vaccine  0.5 mL Intramuscular Tomorrow-1000  . ipratropium  0.5 mg Nebulization Q6H  . levothyroxine  25 mcg Oral QAC breakfast  . LORazepam  0.5-1 mg Oral BID  . mirtazapine  30 mg Oral QHS  . pantoprazole  40 mg Oral Daily  . piperacillin-tazobactam (ZOSYN)  IV  3.375 g Intravenous Once  . potassium chloride SA  20 mEq Oral Daily  . sodium chloride  3 mL Intravenous Q12H  . traZODone  25 mg Oral QHS  . trimethoprim  100 mg Oral Daily  . vancomycin  1,000 mg Intravenous Once   Infusions:    . sodium chloride     PRN: acetaminophen, acetaminophen, albuterol, guaiFENesin-dextromethorphan, HYDROmorphone (DILAUDID) injection, oxyCODONE-acetaminophen Assessment:  78 yo F from assisted living with HCAP starting empiric vancomycin and zosyn per pharmacy   Weight 49 kg  Chronic renal insufficiency, Scr 1.43 is at baseline per MD notes. CrCl 19 ml/min   WBC wnl  Blood and urine cultures sent  Goal of Therapy:  Vancomycin trough level 15-20 mcg/ml  Plan:  1.) Vancomycin 500 mg IV q24h  2.) Zosyn 2.25 gm IV q6h 3.) Follow renal function and cultures 4.) Obtain vancomycin trough as needed  Redmond Whittley, Loma Messing PharmD Pager #: [phone] 6:37 PM 03/30/2012

## 2012-03-31 ENCOUNTER — Inpatient Hospital Stay (HOSPITAL_COMMUNITY): Payer: Medicare Other

## 2012-03-31 DIAGNOSIS — I10 Essential (primary) hypertension: Secondary | ICD-10-CM

## 2012-03-31 LAB — TROPONIN I: Troponin I: <0.3 ng/mL (ref <0.30)

## 2012-03-31 LAB — CBC
Hemoglobin: 10.3 g/dL — ABNORMAL LOW (ref 12.0–15.0)
MCV: 94.7 fL (ref 78.0–100.0)
Platelets: 210 10*3/uL (ref 150–400)
RBC: 3.23 MIL/uL — ABNORMAL LOW (ref 3.87–5.11)
WBC: 5.7 10*3/uL (ref 4.0–10.5)

## 2012-03-31 LAB — BASIC METABOLIC PANEL
CO2: 29 mEq/L (ref 19–32)
Calcium: 8.5 mg/dL (ref 8.4–10.5)
Chloride: 99 mEq/L (ref 96–112)
Glucose, Bld: 136 mg/dL — ABNORMAL HIGH (ref 70–99)
Potassium: 4.2 mEq/L (ref 3.5–5.1)
Sodium: 135 mEq/L (ref 135–145)

## 2012-03-31 LAB — LEGIONELLA ANTIGEN, URINE

## 2012-03-31 MED ORDER — LEVALBUTEROL HCL 0.63 MG/3ML IN NEBU
0.6300 mg | INHALATION_SOLUTION | Freq: Four times a day (QID) | RESPIRATORY_TRACT | Status: DC | PRN
  Filled 2012-03-31: qty 3

## 2012-03-31 MED ORDER — ENSURE COMPLETE PO LIQD
237.0000 mL | Freq: Two times a day (BID) | ORAL | Status: DC
  Administered 2012-03-31 – 2012-04-01 (×2): 237 mL via ORAL

## 2012-03-31 MED ORDER — PIPERACILLIN-TAZOBACTAM 3.375 G IVPB
3.3750 g | Freq: Three times a day (TID) | INTRAVENOUS | Status: DC
  Administered 2012-03-31 – 2012-04-01 (×2): 3.375 g via INTRAVENOUS
  Filled 2012-03-31 (×4): qty 50

## 2012-03-31 MED ORDER — LEVALBUTEROL HCL 0.63 MG/3ML IN NEBU
0.6300 mg | INHALATION_SOLUTION | Freq: Three times a day (TID) | RESPIRATORY_TRACT | Status: DC
  Administered 2012-03-31 – 2012-04-01 (×4): 0.63 mg via RESPIRATORY_TRACT
  Filled 2012-03-31 (×7): qty 3

## 2012-03-31 MED ORDER — IPRATROPIUM BROMIDE 0.02 % IN SOLN: 0.5000 mg | Freq: Four times a day (QID) | RESPIRATORY_TRACT | Status: DC | PRN

## 2012-03-31 NOTE — Progress Notes (Signed)
INITIAL ADULT NUTRITION ASSESSMENT Date: 03/31/2012   Time: 3:14 PM Reason for Assessment: Consult  INTERVENTION: Ensure Complete BID. Encouraged continue excellent meal intake. Will monitor.  Pt meets criteria for severe malnutrition of chronic illness AEB pt with severe muscle wasting and fat loss throughout upper extremities, clavicles, and sternal area.   ASSESSMENT: Female 78 y.o.  Dx: HCAP (healthcare-associated pneumonia)  Food/Nutrition Related Hx: Pt from assisted living facility and reports she was eating "too good" there, 3 meals/day at the dining room. Pt was unsure of usual weight however pt's daughter present at bedside and reports pt's weight is down 18 pounds in the past year and 3 months. Pt reports she drank Ensure on/off when she lived at home but was not on any PTA. Pt reports eating well during admission. Pt reports wearing top and bottom set of dentures which fit properly and denies any problems chewing or swallowing. Pt with significant cachexia and appears very thin and frail.   Hx:  Past Medical History  Diagnosis Date  . Routine general medical examination at a health care facility   . Other B-complex deficiencies   . Unspecified hypothyroidism   . Diverticulitis of colon (without mention of hemorrhage)   . Congestive heart failure, unspecified   . Stricture and stenosis of esophagus   . Duodenal ulcer, unspecified as acute or chronic, without hemorrhage, perforation, or obstruction   . Atrial fibrillation   . Edema   . Dysphagia, unspecified   . Depressive type psychosis   . Sciatica   . Other constipation   . Unspecified essential hypertension   . Arthritis   . Gallstones   . PONV (postoperative nausea and vomiting)    Related Meds:  Scheduled Meds:   . cloNIDine  0.1 mg Oral BID  . diltiazem  120 mg Oral Daily  . furosemide  40 mg Oral Daily  . gabapentin  100 mg Oral QHS  . levalbuterol  0.63 mg Nebulization TID  . levothyroxine  25 mcg Oral  QAC breakfast  . LORazepam  0.5-1 mg Oral BID  . mirtazapine  30 mg Oral QHS  . pantoprazole  40 mg Oral Daily  . piperacillin-tazobactam (ZOSYN)  IV  3.375 g Intravenous Once  . piperacillin-tazobactam (ZOSYN)  IV  3.375 g Intravenous Q8H  . potassium chloride SA  20 mEq Oral Daily  . sodium chloride  3 mL Intravenous Q12H  . traZODone  25 mg Oral QHS  . trimethoprim  100 mg Oral Daily  . vancomycin  1,000 mg Intravenous Once  . DISCONTD: albuterol  2.5 mg Nebulization Q6H  . DISCONTD: influenza  inactive virus vaccine  0.5 mL Intramuscular Tomorrow-1000  . DISCONTD: ipratropium  0.5 mg Nebulization Q6H  . DISCONTD: piperacillin-tazobactam (ZOSYN)  IV  2.25 g Intravenous Q6H  . DISCONTD: vancomycin  500 mg Intravenous Q24H   Continuous Infusions:   . sodium chloride 50 mL/hr at 03/30/12 1815   PRN Meds:.acetaminophen, acetaminophen, guaiFENesin-dextromethorphan, ipratropium, levalbuterol, oxyCODONE-acetaminophen, DISCONTD: albuterol, DISCONTD:  HYDROmorphone (DILAUDID) injection  Ht: 5' 6.1" (167.9 cm)  Wt: 107 lb 2.3 oz (48.6 kg)  Ideal Wt: 130 lb % Ideal Wt: 82  Usual Wt: 125 lb in June/July of 2012 per daughter's report % Usual Wt: 85   Body mass index is 17.24 kg/(m^2).   Labs:  CMP     Component Value Date/Time   NA 135 03/31/2012 0600   K 4.2 03/31/2012 0600   CL 99 03/31/2012 0600   CO2 29  03/31/2012 0600   GLUCOSE 136* 03/31/2012 0600   BUN 20 03/31/2012 0600   CREATININE 1.14* 03/31/2012 0600   CALCIUM 8.5 03/31/2012 0600   PROT 5.8* 03/30/2012 1457   ALBUMIN 2.9* 03/30/2012 1457   AST 14 03/30/2012 1457   ALT 10 03/30/2012 1457   ALKPHOS 96 03/30/2012 1457   BILITOT 0.2* 03/30/2012 1457   GFRNONAA 40* 03/31/2012 0600   GFRAA 47* 03/31/2012 0600    Intake/Output Summary (Last 24 hours) at 03/31/12 1517 Last data filed at 03/31/12 1400  Gross per 24 hour  Intake  787.5 ml  Output   2100 ml  Net -1312.5 ml   Last BM - 9/29  Diet Order: Cardiac   IVF:     sodium chloride Last Rate: 50 mL/hr at 03/30/12 1815    Estimated Nutritional Needs:   Kcal:1450-1700 Protein:60-75g Fluid:1.4-1.7L  NUTRITION DIAGNOSIS: -Underweight (NI-3.1).  Status: Ongoing  RELATED TO: advanced age, worsening cough/shortness of breath  AS EVIDENCE BY: BMI of 17  MONITORING/EVALUATION(Goals): Pt to consume >90% of meals/supplements.   EDUCATION NEEDS: -No education needs identified at this time    Dietitian #: [phone]  DOCUMENTATION CODES Per approved criteria  -Severe malnutrition in the context of chronic illness -Underweight    Marshall Cork 03/31/2012, 3:14 PM

## 2012-03-31 NOTE — Evaluation (Signed)
Physical Therapy Evaluation Patient Details Name: [name] [name] MRN: [id number] DOB: 07-08-1916 Today's Date: 03/31/2012 Time: 4098-1191 PT Time Calculation (min): 16 min  PT Assessment / Plan / Recommendation Clinical Impression  Pt. was admitted with pneumonia from ALF. Pt. tolerated ambulation in room. Pt. rep[orts she ambulates with RW to dining room at ALF. Pt. shpuld be able to return to ALF. Pt. will benefit from PT to improve functional mobility to DC to ALF    PT Assessment  Patient needs continued PT services    Follow Up Recommendations  Home health PT    Barriers to Discharge        Equipment Recommendations  None recommended by PT    Recommendations for Other Services OT consult   Frequency Min 3X/week    Precautions / Restrictions Precautions Precautions: Fall Restrictions Weight Bearing Restrictions: No   Pertinent Vitals/Pain sats > 93% RA      Mobility  Bed Mobility Bed Mobility: Supine to Sit Supine to Sit: 5: Supervision;HOB elevated Details for Bed Mobility Assistance: min cues for hand placement and technique. Transfers Sit to Stand: 4: Min assist;With upper extremity assist;From bed;From toilet Stand to Sit: 4: Min assist;With upper extremity assist;To chair/3-in-1;To toilet Details for Transfer Assistance: min cues for hand placement and safety. Ambulation/Gait Ambulation/Gait Assistance: 4: Min assist Ambulation Distance (Feet): 100 Feet Assistive device: Rolling walker Ambulation/Gait Assistance Details: VC to slow speed, position inside RW.,  Gait Pattern: Trunk flexed Gait velocity: increased speed General Gait Details: keeps RW far ahead of her.    Shoulder Instructions     Exercises     PT Diagnosis: Difficulty walking  PT Problem List: Decreased strength;Decreased activity tolerance;Decreased safety awareness;Decreased mobility;Decreased knowledge of use of DME;Decreased knowledge of precautions PT Treatment Interventions:  DME instruction;Gait training;Functional mobility training;Therapeutic activities;Patient/family education   PT Goals Acute Rehab PT Goals PT Goal Formulation: With patient Time For Goal Achievement: 04/14/12 Potential to Achieve Goals: Good Pt will go Sit to Stand: with supervision PT Goal: Sit to Stand - Progress: Goal set today Pt will go Stand to Sit: with supervision PT Goal: Stand to Sit - Progress: Goal set today Pt will Ambulate: >150 feet;with supervision;with rolling walker PT Goal: Ambulate - Progress: Goal set today  Visit Information  Last PT Received On: 03/31/12 Assistance Needed: +1    Subjective Data  Subjective: I am going home tomorrow. Patient Stated Goal:  to go to ALF   Prior Functioning  Home Living Lives With: Other (Comment) (Pt resides at ALF.) Available Help at Discharge: Available 24 hours/day Type of Home: Assisted living Home Adaptive Equipment: Bedside commode/3-in-1;Walker - rolling Prior Function Level of Independence: Needs assistance Needs Assistance: Bathing;Dressing;Meal Prep Bath: Supervision/set-up Dressing: Supervision/set-up Meal Prep: Total Light Housekeeping: Total Driving: No Vocation: Retired Musician: HOH Dominant Hand: Right    Cognition  Overall Cognitive Status: No family/caregiver present to determine baseline cognitive functioning Arousal/Alertness: Awake/alert Orientation Level: Disoriented to;Time Behavior During Session: WFL for tasks performed Cognition - Other Comments: Pt slow to process info but is able to follow simple commands.    Extremity/Trunk Assessment Right Upper Extremity Assessment RUE ROM/Strength/Tone: WFL for tasks assessed Left Upper Extremity Assessment LUE ROM/Strength/Tone: WFL for tasks assessed Right Lower Extremity Assessment RLE ROM/Strength/Tone: [facility] for tasks assessed Left Lower Extremity Assessment LLE ROM/Strength/Tone: WFL for tasks assessed Trunk  Assessment Trunk Assessment: Normal   Balance Static Standing Balance Static Standing - Balance Support: No upper extremity supported;During functional activity Static Standing -  Level of Assistance: 5: Stand by assistance  End of Session PT - End of Session Activity Tolerance: Patient tolerated treatment well Patient left: in chair;with call bell/phone within reach;with chair alarm set Nurse Communication: Mobility status  GP     Rada Hay 03/31/2012, 11:20 AM  [phone]

## 2012-03-31 NOTE — Evaluation (Signed)
Occupational Therapy Evaluation Patient Details Name: [name] [name] MRN: [id number] DOB: 30-Jan-1917 Today's Date: 03/31/2012 Time: 4098-1191 OT Time Calculation (min): 16 min  OT Assessment / Plan / Recommendation Clinical Impression  Pt is a 78 yo female from ALF who presents with HCAP. Skilled OT recommended to maximize independence with BADLs to supervision level in prep for safe d/c back to ALF with HHOT.    OT Assessment  Patient needs continued OT Services    Follow Up Recommendations  Home health OT    Barriers to Discharge      Equipment Recommendations  None recommended by OT    Recommendations for Other Services    Frequency  Min 2X/week    Precautions / Restrictions Precautions Precautions: Fall Restrictions Weight Bearing Restrictions: No   Pertinent Vitals/Pain Vitals stable on RA during activity.    ADL  Grooming: Performed;Wash/dry hands;Min guard Where Assessed - Grooming: Supported standing Upper Body Bathing: Simulated;Set up Where Assessed - Upper Body Bathing: Unsupported sitting Lower Body Bathing: Simulated;Minimal assistance Where Assessed - Lower Body Bathing: Supported sit to stand Upper Body Dressing: Simulated;Set up Where Assessed - Upper Body Dressing: Unsupported sitting Lower Body Dressing: Simulated;Minimal assistance Where Assessed - Lower Body Dressing: Supported sit to stand Toilet Transfer: Performed;Minimal assistance Toilet Transfer Method: Sit to Barista: Regular height toilet;Grab bars Toileting - Clothing Manipulation and Hygiene: Performed;Minimal assistance Where Assessed - Engineer, mining and Hygiene: Sit to stand from 3-in-1 or toilet Equipment Used: Rolling walker Transfers/Ambulation Related to ADLs: Pt ambulated to the bathroom with min A. VCs for safe manipulation of RW around bathroom.    OT Diagnosis: Generalized weakness  OT Problem List: Decreased activity  tolerance;Decreased safety awareness;Decreased knowledge of use of DME or AE OT Treatment Interventions: Self-care/ADL training;Therapeutic activities;DME and/or AE instruction;Patient/family education   OT Goals Acute Rehab OT Goals OT Goal Formulation: With patient Time For Goal Achievement: 04/14/12 Potential to Achieve Goals: Good ADL Goals Pt Will Perform Grooming: with supervision;Standing at sink ADL Goal: Grooming - Progress: Goal set today Pt Will Perform Lower Body Bathing: with supervision;Sit to stand from chair;Sit to stand from bed ADL Goal: Lower Body Bathing - Progress: Goal set today Pt Will Perform Lower Body Dressing: with supervision;Sit to stand from bed;Sit to stand from chair ADL Goal: Lower Body Dressing - Progress: Goal set today Pt Will Transfer to Toilet: with supervision;Regular height toilet;Comfort height toilet;3-in-1;Ambulation ADL Goal: Toilet Transfer - Progress: Goal set today Pt Will Perform Toileting - Clothing Manipulation: with supervision;Sitting on 3-in-1 or toilet;Standing ADL Goal: Toileting - Clothing Manipulation - Progress: Goal set today Pt Will Perform Toileting - Hygiene: with supervision;Sit to stand from 3-in-1/toilet ADL Goal: Toileting - Hygiene - Progress: Goal set today  Visit Information  Last OT Received On: 03/31/12 Assistance Needed: +1 PT/OT Co-Evaluation/Treatment: Yes    Subjective Data  Subjective: I cant remember the name of the place where I live.... Patient Stated Goal: Not asked.   Prior Functioning     Home Living Lives With: Other (Comment) (Pt resides at ALF.) Available Help at Discharge: Available 24 hours/day Type of Home: Assisted living Home Adaptive Equipment: Bedside commode/3-in-1;Walker - rolling Prior Function Level of Independence: Needs assistance Needs Assistance: Bathing;Dressing;Meal Prep Bath: Supervision/set-up Dressing: Supervision/set-up Meal Prep: Total Light Housekeeping:  Total Driving: No Vocation: Retired Musician: HOH Dominant Hand: Right         Vision/Perception     Cognition  Overall Cognitive Status: No family/caregiver present  to determine baseline cognitive functioning Arousal/Alertness: Awake/alert Orientation Level: Disoriented to;Time Behavior During Session: [facility] for tasks performed Cognition - Other Comments: Pt slow to process info but is able to follow simple commands.    Extremity/Trunk Assessment Right Upper Extremity Assessment RUE ROM/Strength/Tone: [facility] for tasks assessed Left Upper Extremity Assessment LUE ROM/Strength/Tone: WFL for tasks assessed Right Lower Extremity Assessment RLE ROM/Strength/Tone: WFL for tasks assessed Left Lower Extremity Assessment LLE ROM/Strength/Tone: WFL for tasks assessed     Mobility Bed Mobility Bed Mobility: Supine to Sit Supine to Sit: 5: Supervision;HOB elevated Details for Bed Mobility Assistance: min cues for hand placement and technique. Transfers Transfers: Sit to Stand;Stand to Sit Sit to Stand: 4: Min assist;With upper extremity assist;From bed;From toilet Stand to Sit: 4: Min assist;With upper extremity assist;To chair/3-in-1;To toilet Details for Transfer Assistance: min cues for hand placement and safety.     Shoulder Instructions     Exercise     Balance Static Standing Balance Static Standing - Balance Support: No upper extremity supported;During functional activity Static Standing - Level of Assistance: 5: Stand by assistance   End of Session OT - End of Session Activity Tolerance: Patient tolerated treatment well Patient left: in chair;with call bell/phone within reach;with chair alarm set  GO     Nell Gales A OTR/L [phone] 03/31/2012, 11:16 AM

## 2012-03-31 NOTE — Progress Notes (Signed)
Subjective: [name] [name] is well known to me. She has been in AL. She presented with a several day h/o cough and chest dicomfort with cough. ED evaluation revealed normal CBC, CXR with feint change lateral RUL suggestive of PNA and she is admitted for HAP treatment with IV antibiotics.  She is awake and alert. Has had a good breakfast. Asks about going home.  Objective: Lab: Lab Results  Component Value Date   WBC 5.7 03/31/2012   HGB 10.3* 03/31/2012   HCT 30.6* 03/31/2012   MCV 94.7 03/31/2012   PLT 210 03/31/2012   BMET    Component Value Date/Time   NA 135 03/31/2012 0600   K 4.2 03/31/2012 0600   CL 99 03/31/2012 0600   CO2 29 03/31/2012 0600   GLUCOSE 136* 03/31/2012 0600   BUN 20 03/31/2012 0600   CREATININE 1.14* 03/31/2012 0600   CALCIUM 8.5 03/31/2012 0600   GFRNONAA 40* 03/31/2012 0600   GFRAA 47* 03/31/2012 0600     Imaging:  Scheduled Meds:   . albuterol  2.5 mg Nebulization Once  . cloNIDine  0.1 mg Oral BID  . diltiazem  120 mg Oral Daily  . furosemide  40 mg Oral Daily  . gabapentin  100 mg Oral QHS  . influenza  inactive virus vaccine  0.5 mL Intramuscular Tomorrow-1000  . levalbuterol  0.63 mg Nebulization TID  . levothyroxine  25 mcg Oral QAC breakfast  . LORazepam  0.5-1 mg Oral BID  . mirtazapine  30 mg Oral QHS  . pantoprazole  40 mg Oral Daily  . piperacillin-tazobactam (ZOSYN)  IV  2.25 g Intravenous Q6H  . piperacillin-tazobactam (ZOSYN)  IV  3.375 g Intravenous Once  . potassium chloride SA  20 mEq Oral Daily  . sodium chloride  3 mL Intravenous Q12H  . traZODone  25 mg Oral QHS  . trimethoprim  100 mg Oral Daily  . vancomycin  500 mg Intravenous Q24H  . vancomycin  1,000 mg Intravenous Once  . DISCONTD: albuterol  2.5 mg Nebulization Q6H  . DISCONTD: ipratropium  0.5 mg Nebulization Q6H   Continuous Infusions:   . sodium chloride 50 mL/hr at 03/30/12 1815   PRN Meds:.acetaminophen, acetaminophen, guaiFENesin-dextromethorphan, HYDROmorphone  (DILAUDID) injection, ipratropium, levalbuterol, oxyCODONE-acetaminophen, DISCONTD: albuterol   Physical Exam: Filed Vitals:   03/31/12 0438  BP: 130/50  Pulse: 85  Temp: 98.1 F (36.7 C)  Resp: 20  Gen'l - very elderly white woman in no distress HEENT- C&S clear, PERRLA Cor- 2+ radial pulse, RRR Pulm - no increased WOB, no rales or wheezes, good O2 sat Neuro - bright and alert     Assessment/Plan: 1. ID/HAP - no fever, no leukocytosis. Day #2 Vanc / Zosyn. Plan- D/C vanc  Continue zosyn  2. HTN - stable  3. Cardiac- h/o a. Fib - on exam heart rate very well controlled vs NSR Plan-  D/c/ tele  4. H/o CHF - stable w/o signs of decompensation. Plan -  D/c IVF  5. Neuro/psychic - seems to be doing very well on present medical regimen.  6. Hypothyroidism - last TSH July 15, '13 was normal  7. Dispo - return to AL - PT/OT ordered.   [name] [place] IM (o) [phone]; (c) [phone] Call-grp - Patsi Sears IM Tele: [phone]  03/31/2012, 7:59 AM

## 2012-03-31 NOTE — Progress Notes (Signed)
ANTIBIOTIC CONSULT NOTE - FOLLOW UP  Pharmacy Consult for Zosyn Indication: HCAP  Allergies  Allergen Reactions  . Prednisone Other (See Comments)    Delirium   . Sertraline Hcl Other (See Comments)    Delirium    Patient Measurements: Height: 5' 6.1" (167.9 cm) Weight: 107 lb 2.3 oz (48.6 kg) IBW/kg (Calculated) : 59.53   Vital Signs: Temp: 98.1 F (36.7 C) (10/01 0438) Temp src: Oral (10/01 0438) BP: 130/50 mmHg (10/01 0438) Pulse Rate: 85  (10/01 0438) Intake/Output from previous day: 09/30 0701 - 10/01 0700 In: 447.5 [P.O.:120; I.V.:277.5; IV Piggyback:50] Out: 1600 [Urine:1600] Intake/Output from this shift: Total I/O In: 240 [P.O.:240] Out: 450 [Urine:450]  Labs:  [facility] 03/31/12 0600 03/30/12 1457  WBC 5.7 5.7  HGB 10.3* 11.0*  PLT 210 245  LABCREA -- --  CREATININE 1.14* 1.43*   Estimated Creatinine Clearance: 23.7 ml/min (by C-G formula based on Cr of 1.14). No results found for this basename: VANCOTROUGH:2,VANCOPEAK:2,VANCORANDOM:2,GENTTROUGH:2,GENTPEAK:2,GENTRANDOM:2,TOBRATROUGH:2,TOBRAPEAK:2,TOBRARND:2,AMIKACINPEAK:2,AMIKACINTROU:2,AMIKACIN:2, in the last 72 hours   Microbiology: Recent Results (from the past 720 hour(s))  CULTURE, BLOOD (SINGLE)     Status: Normal (Preliminary result)   Collection Time   03/30/12  4:40 PM      Component Value Range Status Comment   Specimen Description BLOOD RIGHT ARM   Final    Special Requests BOTTLES DRAWN AEROBIC AND ANAEROBIC 6CC   Final    Culture  Setup Time 03/30/2012 21:46   Final    Culture     Final    Value:        BLOOD CULTURE RECEIVED NO GROWTH TO DATE CULTURE WILL BE HELD FOR 5 DAYS BEFORE ISSUING A FINAL NEGATIVE REPORT   Report Status PENDING   Incomplete   CULTURE, BLOOD (ROUTINE X 2)     Status: Normal (Preliminary result)   Collection Time   03/30/12  7:01 PM      Component Value Range Status Comment   Specimen Description BLOOD RIGHT HAND   Final    Special Requests BOTTLES DRAWN AEROBIC  ONLY 2CC   Final    Culture  Setup Time 03/30/2012 23:35   Final    Culture     Final    Value:        BLOOD CULTURE RECEIVED NO GROWTH TO DATE CULTURE WILL BE HELD FOR 5 DAYS BEFORE ISSUING A FINAL NEGATIVE REPORT   Report Status PENDING   Incomplete   CULTURE, BLOOD (ROUTINE X 2)     Status: Normal (Preliminary result)   Collection Time   03/30/12  7:03 PM      Component Value Range Status Comment   Specimen Description BLOOD LEFT HAND   Final    Special Requests BOTTLES DRAWN AEROBIC ONLY 3CC   Final    Culture  Setup Time 03/30/2012 23:35   Final    Culture     Final    Value:        BLOOD CULTURE RECEIVED NO GROWTH TO DATE CULTURE WILL BE HELD FOR 5 DAYS BEFORE ISSUING A FINAL NEGATIVE REPORT   Report Status PENDING   Incomplete     Anti-infectives     Start     Dose/Rate Route Frequency Ordered Stop   03/31/12 2000   vancomycin (VANCOCIN) 500 mg in sodium chloride 0.9 % 100 mL IVPB  Status:  Discontinued        500 mg 100 mL/hr over 60 Minutes Intravenous Every 24 hours 03/30/12 1850 03/31/12 0816  03/31/12 1000   trimethoprim (TRIMPEX) tablet 100 mg        100 mg Oral Daily 03/30/12 1812     03/31/12 0000  piperacillin-tazobactam (ZOSYN) IVPB 2.25 g       2.25 g 100 mL/hr over 30 Minutes Intravenous Every 6 hours 03/30/12 1846     03/30/12 1615   vancomycin (VANCOCIN) IVPB 1000 mg/200 mL premix        1,000 mg 200 mL/hr over 60 Minutes Intravenous  Once 03/30/12 1603 03/30/12 1741   03/30/12 1615  piperacillin-tazobactam (ZOSYN) IVPB 3.375 g       3.375 g 12.5 mL/hr over 240 Minutes Intravenous  Once 03/30/12 1603 03/30/12 2147          Assessment:  89 yof with c/o SOB, admit from ALF for HCAP  Vancomycin/Zosyn begun yesterday, doses adjusted for renal function.  Clearance improved today, Vancomycin discontinued.  Goal of Therapy:  Antibiotics appropriate for culture/renal function.  Plan:  Can change Zosyn to 3.375 gm q8h- 4 hr infusion. Monitor  cultures, renal function.  Otho Bellows PharmD Pager [phone] 03/31/2012,1:54 PM

## 2012-03-31 NOTE — Progress Notes (Signed)
CARE MANAGEMENT NOTE 03/31/2012  Patient:  [name] [name], [name] [name]   Account Number:  [id number]  Date Initiated:  03/31/2012  Documentation initiated by:  DAVIS,RHONDA  Subjective/Objective Assessment:   pt with weakness, cough and fever cxr-rt ulp     Action/Plan:   from alf   Anticipated DC Date:  04/03/2012   Anticipated DC Plan:  ASSISTED LIVING / REST HOME  In-house referral  Clinical Social Worker      DC Planning Services  NA      [facility] Choice  NA   Choice offered to / List presented to:  NA   DME arranged  NA      DME agency  NA     HH arranged  NA      HH agency  NA   Status of service:  In process, will continue to follow Medicare Important Message given?  NA - LOS <3 / Initial given by admissions (If response is "NO", the following Medicare IM given date fields will be blank) Date Medicare IM given:   Date Additional Medicare IM given:    Discharge Disposition:    Per UR Regulation:  Reviewed for med. necessity/level of care/duration of stay  If discussed at Long Length of Stay Meetings, dates discussed:    Comments:  [id number]/Rhonda Earlene Plater, RN, BSN, CCM: CHART REVIEWED AND UPDATED. NO DISCHARGE NEEDS PRESENT AT THIS TIME. CASE MANAGEMENT [phone]

## 2012-04-01 ENCOUNTER — Encounter (HOSPITAL_COMMUNITY): Payer: Self-pay | Admitting: Emergency Medicine

## 2012-04-01 ENCOUNTER — Inpatient Hospital Stay (HOSPITAL_COMMUNITY)
Admission: EM | Admit: 2012-04-01 | Discharge: 2012-04-06 | DRG: 205 | Disposition: A | Discharge status: Skilled Nursing Facility | Payer: Medicare Other | Attending: Internal Medicine | Admitting: Internal Medicine

## 2012-04-01 ENCOUNTER — Emergency Department (HOSPITAL_COMMUNITY): Payer: Medicare Other

## 2012-04-01 DIAGNOSIS — F341 Dysthymic disorder: Secondary | ICD-10-CM | POA: Diagnosis present

## 2012-04-01 DIAGNOSIS — J40 Bronchitis, not specified as acute or chronic: Secondary | ICD-10-CM | POA: Diagnosis present

## 2012-04-01 DIAGNOSIS — E46 Unspecified protein-calorie malnutrition: Secondary | ICD-10-CM

## 2012-04-01 DIAGNOSIS — I5032 Chronic diastolic (congestive) heart failure: Secondary | ICD-10-CM | POA: Diagnosis present

## 2012-04-01 DIAGNOSIS — Z681 Body mass index (BMI) 19 or less, adult: Secondary | ICD-10-CM

## 2012-04-01 DIAGNOSIS — E039 Hypothyroidism, unspecified: Secondary | ICD-10-CM

## 2012-04-01 DIAGNOSIS — N189 Chronic kidney disease, unspecified: Secondary | ICD-10-CM

## 2012-04-01 DIAGNOSIS — I509 Heart failure, unspecified: Secondary | ICD-10-CM | POA: Diagnosis present

## 2012-04-01 DIAGNOSIS — I10 Essential (primary) hypertension: Secondary | ICD-10-CM | POA: Diagnosis present

## 2012-04-01 DIAGNOSIS — Z87891 Personal history of nicotine dependence: Secondary | ICD-10-CM

## 2012-04-01 DIAGNOSIS — F323 Major depressive disorder, single episode, severe with psychotic features: Secondary | ICD-10-CM

## 2012-04-01 DIAGNOSIS — R0602 Shortness of breath: Secondary | ICD-10-CM | POA: Diagnosis present

## 2012-04-01 DIAGNOSIS — I4891 Unspecified atrial fibrillation: Secondary | ICD-10-CM

## 2012-04-01 DIAGNOSIS — R0902 Hypoxemia: Principal | ICD-10-CM

## 2012-04-01 DIAGNOSIS — J189 Pneumonia, unspecified organism: Secondary | ICD-10-CM | POA: Diagnosis present

## 2012-04-01 LAB — CBC WITH DIFFERENTIAL/PLATELET
Basophils Absolute: 0 10*3/uL (ref 0.0–0.1)
Eosinophils Absolute: 0.1 10*3/uL (ref 0.0–0.7)
Eosinophils Relative: 1 % (ref 0–5)
Hemoglobin: 11.8 g/dL — ABNORMAL LOW (ref 12.0–15.0)
Lymphocytes Relative: 11 % — ABNORMAL LOW (ref 12–46)
MCHC: 32.8 g/dL (ref 30.0–36.0)
Monocytes Absolute: 0.5 10*3/uL (ref 0.1–1.0)
Neutro Abs: 6.9 10*3/uL (ref 1.7–7.7)
Neutrophils Relative %: 82 % — ABNORMAL HIGH (ref 43–77)
RBC: 3.82 MIL/uL — ABNORMAL LOW (ref 3.87–5.11)

## 2012-04-01 LAB — POCT I-STAT TROPONIN I: Troponin i, poc: 0.01 ng/mL (ref 0.00–0.08)

## 2012-04-01 LAB — TROPONIN I: Troponin I: <0.3 ng/mL (ref <0.30)

## 2012-04-01 LAB — COMPREHENSIVE METABOLIC PANEL
CO2: 28 mEq/L (ref 19–32)
Calcium: 9.3 mg/dL (ref 8.4–10.5)
Creatinine, Ser: 1.01 mg/dL (ref 0.50–1.10)
GFR calc Af Amer: 54 mL/min — ABNORMAL LOW (ref >90)
GFR calc non Af Amer: 46 mL/min — ABNORMAL LOW (ref >90)
Glucose, Bld: 131 mg/dL — ABNORMAL HIGH (ref 70–99)
Total Protein: 6.3 g/dL (ref 6.0–8.3)

## 2012-04-01 LAB — LACTIC ACID, PLASMA: Lactic Acid, Venous: 1 mmol/L (ref 0.5–2.2)

## 2012-04-01 MED ORDER — LORAZEPAM 0.5 MG PO TABS
0.5000 mg | ORAL_TABLET | Freq: Two times a day (BID) | ORAL | Status: DC
  Administered 2012-04-02 – 2012-04-05 (×8): 1 mg via ORAL
  Administered 2012-04-05 – 2012-04-06 (×2): 0.5 mg via ORAL
  Filled 2012-04-01 (×2): qty 2
  Filled 2012-04-01: qty 1
  Filled 2012-04-01 (×5): qty 2
  Filled 2012-04-01: qty 1
  Filled 2012-04-01: qty 2

## 2012-04-01 MED ORDER — DOCUSATE SODIUM 100 MG PO CAPS
100.0000 mg | ORAL_CAPSULE | Freq: Two times a day (BID) | ORAL | Status: DC
  Administered 2012-04-02 – 2012-04-06 (×10): 100 mg via ORAL
  Filled 2012-04-01 (×11): qty 1

## 2012-04-01 MED ORDER — IPRATROPIUM BROMIDE 0.02 % IN SOLN
0.5000 mg | Freq: Once | RESPIRATORY_TRACT | Status: AC
  Administered 2012-04-01: 0.5 mg via RESPIRATORY_TRACT
  Filled 2012-04-01: qty 2.5

## 2012-04-01 MED ORDER — ONDANSETRON HCL 4 MG PO TABS: 4.0000 mg | ORAL_TABLET | Freq: Four times a day (QID) | ORAL | Status: DC | PRN

## 2012-04-01 MED ORDER — PANTOPRAZOLE SODIUM 40 MG PO TBEC
40.0000 mg | DELAYED_RELEASE_TABLET | Freq: Every day | ORAL | Status: DC
  Administered 2012-04-02 – 2012-04-06 (×5): 40 mg via ORAL
  Filled 2012-04-01 (×5): qty 1

## 2012-04-01 MED ORDER — ENSURE COMPLETE PO LIQD
237.0000 mL | Freq: Two times a day (BID) | ORAL | Status: DC
  Administered 2012-04-02 – 2012-04-06 (×6): 237 mL via ORAL
  Filled 2012-04-01: qty 237

## 2012-04-01 MED ORDER — METHYLPREDNISOLONE SODIUM SUCC 125 MG IJ SOLR
125.0000 mg | Freq: Once | INTRAMUSCULAR | Status: AC
  Administered 2012-04-01: 125 mg via INTRAVENOUS
  Filled 2012-04-01: qty 2

## 2012-04-01 MED ORDER — AMOXICILLIN-POT CLAVULANATE 875-125 MG PO TABS: 1.0000 | ORAL_TABLET | Freq: Two times a day (BID) | ORAL | Status: DC

## 2012-04-01 MED ORDER — ALBUTEROL SULFATE (5 MG/ML) 0.5% IN NEBU
5.0000 mg | INHALATION_SOLUTION | Freq: Once | RESPIRATORY_TRACT | Status: AC
  Administered 2012-04-01: 5 mg via RESPIRATORY_TRACT
  Filled 2012-04-01: qty 1

## 2012-04-01 MED ORDER — GUAIFENESIN-DM 100-10 MG/5ML PO SYRP
5.0000 mL | ORAL_SOLUTION | ORAL | Status: DC | PRN
  Administered 2012-04-04: 08:00:00 via ORAL
  Filled 2012-04-01: qty 10

## 2012-04-01 MED ORDER — OXYCODONE-ACETAMINOPHEN 5-325 MG PO TABS: 1.0000 | ORAL_TABLET | Freq: Four times a day (QID) | ORAL | Status: DC | PRN

## 2012-04-01 MED ORDER — MIRTAZAPINE 30 MG PO TABS
30.0000 mg | ORAL_TABLET | Freq: Every day | ORAL | Status: DC
  Administered 2012-04-02 – 2012-04-05 (×5): 30 mg via ORAL
  Filled 2012-04-01 (×6): qty 1

## 2012-04-01 MED ORDER — SODIUM CHLORIDE 0.9 % IV SOLN
INTRAVENOUS | Status: DC
  Administered 2012-03-31: 18:00:00 via INTRAVENOUS

## 2012-04-01 MED ORDER — ACETAMINOPHEN 325 MG PO TABS: 650.0000 mg | ORAL_TABLET | Freq: Four times a day (QID) | ORAL | Status: DC | PRN

## 2012-04-01 MED ORDER — DILTIAZEM HCL ER COATED BEADS 120 MG PO CP24
120.0000 mg | ORAL_CAPSULE | Freq: Every day | ORAL | Status: DC
  Administered 2012-04-02 – 2012-04-06 (×5): 120 mg via ORAL
  Filled 2012-04-01 (×5): qty 1

## 2012-04-01 MED ORDER — FUROSEMIDE 40 MG PO TABS
40.0000 mg | ORAL_TABLET | Freq: Every day | ORAL | Status: DC
  Administered 2012-04-02 – 2012-04-06 (×5): 40 mg via ORAL
  Filled 2012-04-01 (×5): qty 1

## 2012-04-01 MED ORDER — SODIUM CHLORIDE 0.9 % IV BOLUS (SEPSIS)
2000.0000 mL | Freq: Once | INTRAVENOUS | Status: AC
  Administered 2012-04-01: 1000 mL via INTRAVENOUS

## 2012-04-01 MED ORDER — TRAZODONE 25 MG HALF TABLET
25.0000 mg | ORAL_TABLET | Freq: Every day | ORAL | Status: DC
  Administered 2012-04-02 – 2012-04-05 (×5): 25 mg via ORAL
  Filled 2012-04-01 (×6): qty 1

## 2012-04-01 MED ORDER — GUAIFENESIN-DM 100-10 MG/5ML PO SYRP: 5.0000 mL | ORAL_SOLUTION | ORAL | Status: DC | PRN

## 2012-04-01 MED ORDER — LEVOTHYROXINE SODIUM 25 MCG PO TABS
25.0000 ug | ORAL_TABLET | Freq: Every day | ORAL | Status: DC
  Administered 2012-04-02 – 2012-04-06 (×5): 25 ug via ORAL
  Filled 2012-04-01 (×7): qty 1

## 2012-04-01 MED ORDER — ACETAMINOPHEN 650 MG RE SUPP: 650.0000 mg | Freq: Four times a day (QID) | RECTAL | Status: DC | PRN

## 2012-04-01 MED ORDER — ALBUTEROL SULFATE (5 MG/ML) 0.5% IN NEBU
2.5000 mg | INHALATION_SOLUTION | Freq: Four times a day (QID) | RESPIRATORY_TRACT | Status: DC
  Administered 2012-04-01 – 2012-04-02 (×3): 2.5 mg via RESPIRATORY_TRACT
  Filled 2012-04-01 (×3): qty 0.5

## 2012-04-01 MED ORDER — GABAPENTIN 100 MG PO CAPS
100.0000 mg | ORAL_CAPSULE | Freq: Every day | ORAL | Status: DC
  Administered 2012-04-02 – 2012-04-05 (×5): 100 mg via ORAL
  Filled 2012-04-01 (×6): qty 1

## 2012-04-01 MED ORDER — MOXIFLOXACIN HCL IN NACL 400 MG/250ML IV SOLN
400.0000 mg | INTRAVENOUS | Status: DC
  Administered 2012-04-02: 400 mg via INTRAVENOUS
  Filled 2012-04-01: qty 250

## 2012-04-01 MED ORDER — ENOXAPARIN SODIUM 40 MG/0.4ML ~~LOC~~ SOLN
40.0000 mg | SUBCUTANEOUS | Status: DC
  Administered 2012-04-02: 40 mg via SUBCUTANEOUS
  Filled 2012-04-01 (×2): qty 0.4

## 2012-04-01 MED ORDER — SODIUM CHLORIDE 0.9 % IV SOLN
INTRAVENOUS | Status: DC
  Administered 2012-04-02: via INTRAVENOUS

## 2012-04-01 MED ORDER — CLONIDINE HCL 0.1 MG PO TABS
0.1000 mg | ORAL_TABLET | Freq: Two times a day (BID) | ORAL | Status: DC
  Administered 2012-04-02 – 2012-04-06 (×10): 0.1 mg via ORAL
  Filled 2012-04-01 (×11): qty 1

## 2012-04-01 MED ORDER — ENSURE COMPLETE PO LIQD: 237.0000 mL | Freq: Two times a day (BID) | ORAL | Status: DC

## 2012-04-01 MED ORDER — ONDANSETRON HCL 4 MG/2ML IJ SOLN: 4.0000 mg | Freq: Four times a day (QID) | INTRAMUSCULAR | Status: DC | PRN

## 2012-04-01 MED ORDER — TRIMETHOPRIM 100 MG PO TABS
100.0000 mg | ORAL_TABLET | Freq: Every day | ORAL | Status: DC
  Administered 2012-04-02 – 2012-04-06 (×5): 100 mg via ORAL
  Filled 2012-04-01 (×5): qty 1
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
## 2012-04-01 NOTE — Progress Notes (Signed)
Patient doing well and she is stable for return to AL with [facility] RN/PT/OT  Dictated # [phone]

## 2012-04-01 NOTE — ED Notes (Signed)
IV RN called to start IV on patient.

## 2012-04-01 NOTE — Care Management Note (Signed)
    Page 1 of 2   04/01/2012     12:16:32 PM   CARE MANAGEMENT NOTE 04/01/2012  Patient:  BRONWYN, BELASCO   Account Number:  [id number]  Date Initiated:  03/31/2012  Documentation initiated by:  DAVIS,RHONDA  Subjective/Objective Assessment:   pt with weakness, cough and fever cxr-rt ulp     Action/Plan:   from alf   Anticipated DC Date:  04/01/2012   Anticipated DC Plan:  ASSISTED LIVING / REST HOME  In-house referral  Clinical Social Worker      DC Planning Services  NA      [facility] Choice  NA   Choice offered to / List presented to:  NA   DME arranged  NA      DME agency  NA     HH arranged  HH-1 RN  HH-2 PT  HH-3 OT      HH agency  [facility] [address]   Status of service:  Completed, signed off Medicare Important Message given?  NA - LOS <3 / Initial given by admissions (If response is "NO", the following Medicare IM given date fields will be blank) Date Medicare IM given:   Date Additional Medicare IM given:    Discharge Disposition:  ASSISTED LIVING  Per UR Regulation:  Reviewed for med. necessity/level of care/duration of stay  If discussed at Long Length of Stay Meetings, dates discussed:    Comments:  04/01/12 Jahniya Duzan RN,BSN NCM [phone] [facility] OWN HH,SPOKE TO MELANIE ABOUT D/C ORDERS HHRN/PT/OT.  [id number] Earlene Plater, RN, BSN, CCM: CHART REVIEWED AND UPDATED. NO DISCHARGE NEEDS PRESENT AT THIS TIME. CASE MANAGEMENT [phone]

## 2012-04-01 NOTE — Progress Notes (Signed)
CSW familiar with patient as, this Clinical research associate assessed patient in ED on 03-30-2012 and was admitted as inpatient.  CSW will inform unit csw that patient is being admitted again under observation.    CSW placed fl2 in patient shadow chart to be placed in unit shadow chart for attending MD signature.   Catha Gosselin, LCSWA  [phone] .04/01/2012 1023pm

## 2012-04-01 NOTE — ED Provider Notes (Signed)
History     CSN: [id number]  Arrival date & time 04/01/12  1546   First MD Initiated Contact with Patient 04/01/12 1659      Chief Complaint  Patient presents with  . Shortness of Breath    (Consider location/radiation/quality/duration/timing/severity/associated sxs/prior treatment) HPI This 78 year old female was discharged yesterday for HCAP back to her assisted-living facility on home oxygen. She has had several days of a cough shortness of breath and right-sided chest pain when she coughs. She was admitted 2 days ago and discharged yesterday. Her followup chest x-ray yesterday showed that her possible right upper lobe pneumonia had cleared radiographically. Patient felt much better yesterday was discharged back to her assisted-living facility but now has about 24 hours of recurrent worsening cough and shortness of breath and chest pain whenever she coughs. Her chest has been constant for over 24 hours and really for several days worse with palpation and coughing and torso movement and nonexertional.Pt on Augmentin. patient feels generally weak and wants to be readmitted and states her assisted-living facility wants her readmitted as well.  Past Medical History  Diagnosis Date  . Routine general medical examination at a health care facility   . Other B-complex deficiencies   . Unspecified hypothyroidism   . Diverticulitis of colon (without mention of hemorrhage)   . Congestive heart failure, unspecified   . Stricture and stenosis of esophagus   . Duodenal ulcer, unspecified as acute or chronic, without hemorrhage, perforation, or obstruction   . Atrial fibrillation   . Edema   . Dysphagia, unspecified   . Depressive type psychosis   . Sciatica   . Other constipation   . Unspecified essential hypertension   . Arthritis   . Gallstones   . PONV (postoperative nausea and vomiting)     Past Surgical History  Procedure Date  . Abdominal hysterectomy   . Cholecystectomy      Family History  Problem Relation Age of Onset  . Cancer Other     breast   . Heart disease Other     History  Substance Use Topics  . Smoking status: Never Smoker   . Smokeless tobacco: Former Neurosurgeon    Types: Snuff    Quit date: 01/13/1964  . Alcohol Use: No    OB History    Grav Para Term Preterm Abortions TAB SAB Ect Mult Living                  Review of Systems 10 Systems reviewed and are negative for acute change except as noted in the HPI. Allergies  Prednisone and Sertraline hcl  Home Medications   No current outpatient prescriptions on file.  BP 119/42  Pulse 82  Temp 98.1 F (36.7 C) (Oral)  Resp 16  Ht 5' 6.1" (1.679 m)  Wt 104 lb 15 oz (47.6 kg)  BMI 16.89 kg/m2  SpO2 96%  Physical Exam  Nursing note and vitals reviewed. Constitutional:       Awake, alert, nontoxic appearance, mild resp distress at rest with O2 sats normal in high 90s on N/C O2  HENT:  Head: Atraumatic.       Oral dry mucosa  Eyes: Right eye exhibits no discharge. Left eye exhibits no discharge.  Neck: Neck supple.  Cardiovascular: Regular rhythm.   No murmur heard.      Tachycardic  Pulmonary/Chest: She is in respiratory distress. She has wheezes. She has no rales. She exhibits tenderness.       Diffuse  wheezing and rhonchi with mild retractions no accessory muscle usage right chest wall has reproducible tenderness without rash noted  Abdominal: Soft. There is no tenderness. There is no rebound and no guarding.  Musculoskeletal: She exhibits no edema and no tenderness.       Baseline ROM, no obvious new focal weakness.  Neurological: She is alert.       Mental status and motor strength appears baseline for patient and situation.  Skin: No rash noted.  Psychiatric: She has a normal mood and affect.    ED Course  Procedures (including critical care time) ECG: Normal sinus rhythm, ventricular rate 96, normal axis, normal intervals, no acute ischemic changes noted, no  significant change noted compared with 2 days ago   Lungs clear and pulse oximetry appears to be stable at 97% on room air after return of the patient's oxygen because the patient's family states she was discharged home today without oxygen. Patient and her family are still not comfortable with the discharge back to the assisted-living facility despite care management talking to the patient already. [facility] hospitalist paged to consider readmission versus discharge.2000 Labs Reviewed  CBC WITH DIFFERENTIAL - Abnormal; Notable for the following:    RBC 3.82 (*)     Hemoglobin 11.8 (*)     Neutrophils Relative 82 (*)     Lymphocytes Relative 11 (*)     All other components within normal limits  COMPREHENSIVE METABOLIC PANEL - Abnormal; Notable for the following:    Glucose, Bld 131 (*)     Albumin 3.0 (*)     Total Bilirubin 0.2 (*)     GFR calc non Af Amer 46 (*)     GFR calc Af Amer 54 (*)     All other components within normal limits  PRO B NATRIURETIC PEPTIDE - Abnormal; Notable for the following:    Pro B Natriuretic peptide (BNP) 746.9 (*)     All other components within normal limits  BLOOD GAS, ARTERIAL - Abnormal; Notable for the following:    pH, Arterial 7.490 (*)     pO2, Arterial 64.0 (*)     Bicarbonate 27.8 (*)     Acid-Base Excess 4.6 (*)     All other components within normal limits  BASIC METABOLIC PANEL - Abnormal; Notable for the following:    Glucose, Bld 220 (*)     GFR calc non Af Amer 51 (*)     GFR calc Af Amer 59 (*)     All other components within normal limits  CBC - Abnormal; Notable for the following:    RBC 3.46 (*)     Hemoglobin 10.7 (*)     HCT 32.7 (*)     All other components within normal limits  CBC WITH DIFFERENTIAL - Abnormal; Notable for the following:    WBC 11.0 (*)     RBC 3.41 (*)     Hemoglobin 10.9 (*)     HCT 32.4 (*)     Neutro Abs 8.3 (*)     All other components within normal limits  LACTIC ACID, PLASMA  CULTURE, BLOOD  (ROUTINE X 2)  CULTURE, BLOOD (ROUTINE X 2)  POCT I-STAT TROPONIN I  TROPONIN I  TROPONIN I   Dg Chest 2 View  04/03/2012  *RADIOLOGY REPORT*  Clinical Data: 78 year old female shortness of breath and wheezing.  CHEST - 2 VIEW  Comparison: 04/01/2012 and earlier.  Findings: Lung volumes are stable within normal limits.  Cardiac  size and mediastinal contours are within normal limits.  Visualized tracheal air column is within normal limits.  No pneumothorax, pleural effusion, pulmonary edema or confluent pulmonary opacity. Stable osteopenia and visualized osseous structures.  IMPRESSION: No acute cardiopulmonary abnormality.   Original Report Authenticated By: Harley Hallmark, M.D.     ED Dx: Acute Bronchospasm   MDM  Pt improved in ED with no significant deterioration in condition.  Patient / Family / Caregiver informed of clinical course, understand medical decision-making process, and agree with plan.          Hurman Horn, MD 04/04/12 [phone]

## 2012-04-01 NOTE — Discharge Summary (Signed)
NAMEMarland Kitchen  [name] [name], [name] [name] NO.:  [id number]  MEDICAL RECORD NO.:  [id number]  LOCATION:  1419                         FACILITY:  [facility]  PHYSICIAN:  Rosalyn Gess. Elnora Quizon, MD  DATE OF BIRTH:  08-03-1916  DATE OF ADMISSION:  03/30/2012 DATE OF DISCHARGE:  04/01/2012                              DISCHARGE SUMMARY   ADMITTING DIAGNOSES: 1. Facility-acquired pneumonia. 2. Chest wall pain. 3. Hypertension. 4. Atrial fibrillation, rate controlled. 5. History of congestive heart failure with a known ejection fraction     of 65% with grade 1 diastolic dysfunction.  BNP at admission 713. 6. Hyponatremia. 7. History of esophageal stricture. 8. History of chronic constipation. 9. Mild renal insufficiency. 10.Anxiety. 11.Gastroesophageal reflux disease.  DISCHARGE DIAGNOSES: 1. Facility-acquired pneumonia. 2. Chest wall pain. 3. Hypertension. 4. Atrial fibrillation, rate controlled. 5. History of congestive heart failure with a known ejection fraction     of 65% with grade 1 diastolic dysfunction.  BNP at admission 713. 6. Hyponatremia. 7. History of esophageal stricture. 8. History of chronic constipation. 9. Mild renal insufficiency. 10.Anxiety. 11.Gastroesophageal reflux disease.  CODE STATUS:  Full code.  HISTORY OF PRESENT ILLNESS:  [name] [name] is a delightful 78 year old, residing at [facility].  She has a complex medical history as noted in the records.  She presented to [facility] Emergency Department because of cough, weakness and shortness of breath.  The patient's daughter, Maryan Puls is her primary informant.  The patient's symptoms began several days prior to admission and were persistent. Associated symptoms include decreased appetite and right-sided chest pain worse with coughing.  The patient denied any fevers or chills, or nausea, vomiting, or diarrhea.  The patient does use O2 at home on a p.r.n. basis and this did not help her shortness  of breath.  In the emergency department, there was concern on chest x-ray a possible infiltrate.  The patient was subsequently admitted for IV antibiotics for facility-acquired pneumonia.  Please see the H and P and previous Epic records for past medical history, family history, and social history.  HOSPITAL COURSE: 1. Pneumonia.  At admission, the patient was afebrile.  Her white     count at admission was 5700.  No differential was performed.  Chest     x-ray on March 30, 2012, was read as showing suggestion of     small amount of airspace opacity in the right upper lung zone     suggestive of a right upper lobe pneumonia.  The patient had a     followup chest x-ray on March 31, 2012, which was read as showing     interval resolution of subtle right upper lobe process, previously     described.  Lungs were clear.  There was no evidence of active     disease.  The patient had been started on Zosyn and vancomycin at admission.  On the first full hospital day with the patient remaining afebrile with having no leukocytosis with lungs appearing to be clear, vancomycin was discontinued.  Followup chest x-ray was obtained as noted.  The patient's cough was markedly improved.  With the patient's chest x-ray having cleared with her remaining afebrile, at this point, she  is ready to return to her assisted living situation and we will complete an oral antibiotic course using Augmentin 875 mg b.i.d. for an additional 5 days.  The patient may also use Robitussin DM or equivalent cough syrup. 1. Hypertension.  The patient's blood pressure was generally stable     during her hospital stay, although, she had a mild excursion on the     morning of discharge to 130/100. 2. Cardiovascular.  The patient remained in rate controlled atrial     fibrillation.  She had no evidence of heart failure either on     physical exam or x-ray.  Admitting BNP was low at 713.  Plan, the     patient will  continue her home medications. 3. Nutritional status.  The patient was found to be protein     malnourished.  She was seen in consultation by the Nutritional     Service.  It was felt that the patient did not require any     additional education.  She does have meals provided 3 times a day     and recommended that she resume Ensure at 2 cans or cups of pudding     daily to help maintain and possibly gain some weight.  With the patient being stable with scant evidence of continued respiratory infection, the patient at this time is stable and ready for transfer back to assisted living.  We will arrange for Home Health for RN checkup and PT and OT as recommended by inpatient evaluation team.  The patient will be seen in the office for followup in 7-10 days, sooner as needed.  DISCHARGE EXAMINATION:  VITAL SIGNS:  Temperature was 97.7, blood pressure 130/100, pulse was 84, respirations 18, oxygen saturation was 99%. GENERAL APPEARANCE:  This is a very elderly woman, very pleasant, awake, alert, oriented, and in good spirits. HEENT:  Conjunctivae and sclerae were clear.  Pupils were equal, round, and reactive.  The patient is edentulous. NECK:  Supple. CHEST:  The patient is moving air well.  I appreciated no rales, wheezes, no rhonchi.  There was no increased work of breathing. CARDIOVASCULAR:  2+ radial pulse.  Her precordium was quiet.  She had a very well rate-controlled rhythm, probably an atrial fibrillation with an irregular rate hard to determine given her controlled rate. BREAST:  Deferred. ABDOMEN:  Soft.  No guarding, no rebound, no tenderness was noted. GENITALIA:  Deferred. EXTREMITIES:  The patient is very thin, but she has no obvious deformities. NEUROLOGIC:  The patient is awake, alert.  She is oriented.  Speech is clear.  Exam is nonfocal.  DISCHARGE MEDICATIONS:  Please see the discharge medication sheet.  The patient will resume all of her home medications.  We  will add Augmentin 875 b.i.d. as noted.  We will add Robitussin DM as noted.  We will add Ensure.  The patient's condition at time of discharge dictation is stable and improved, but guarded given her advanced age and multiple comorbidities.     Rosalyn Gess Cainen Burnham, MD     MEN/MEDQ  D:  04/01/2012  T:  04/01/2012  Job:  [id number]

## 2012-04-01 NOTE — ED Notes (Signed)
Per EMS: Facility called EMS after discharge from hospital due to pt inability to maintain oxygen saturation and shortness of breath at rest in bed.

## 2012-04-01 NOTE — Progress Notes (Signed)
[facility] ED CM consulted by EDP, Bedar to speak with pt and family after he discussed with CM pt concerns voiced during his initial evaluation.  Pt recently d/c back to assisted living 04/01/12 after hospitalized for pneumonia Pending labs and imaging reports.  Cm came to speak with pt but she is receiving respiratory care

## 2012-04-01 NOTE — H&P (Addendum)
[facility] History and Physical  [name] [name] VQQ:[id number] DOB: [birth year] DOA: 04/01/2012   PCP: [name], MD   Chief Complaint: Shortness of breath  HPI:  78 year old female presents with acute onset of shortness of breath at the assisted living facility. The patient was recently admitted to the hospital on 03/30/2012 and was discharged earlier today. The patient was sent home on Augmentin after 2 days of Zosyn in the hospital for presumptive pneumonia.Marland Kitchen She was back at her assisted-living facility for approximately 2 hours when she had worsening shortness of breath and wheezing. As a result, the nursing staff at the assisted living facility called EMS to bring her back to the hospital. On initial evaluation by the emergency physician the patient was wheezing and hypoxemic. The patient was given Solu-Medrol 125 mg as well as albuterol and Atrovent nebulizer. Her wheezing and shortness of breath did improve. The patient never complained of any chest discomfort, dizziness, fevers, chills. She did complain of a continuing nonproductive cough. She denied any hemoptysis.  When the patient and the family were told that she will be sent back to the assisted-living facility the patient became quite anxious and had worsening shortness of breath and wheezing again. However, All her labs and vital signs were stable. However, because of the patient's continued shortness of breath the patient will be admitted to observation. 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 [name] will be notified of patient's admission History of pneumonia/bronchitis -Will start the patient on Avelox to cover atypical organisms -Chest x-ray did not have distinct  infiltrate Hypothyroidism -Continue Synthroid Anxiety/depression -Continue Remeron and Ativan Chronic diastolic heart failure -No signs of decompensation at this time -Continue home dose of furosemide       Past Medical History  Diagnosis Date  . Routine general medical examination at a health care facility   . Other B-complex deficiencies   . Unspecified hypothyroidism   . Diverticulitis of colon (without mention of hemorrhage)   . Congestive heart failure, unspecified   . Stricture and stenosis of esophagus   . Duodenal ulcer, unspecified as acute or chronic, without hemorrhage, perforation, or obstruction   . Atrial fibrillation   . Edema   . Dysphagia, unspecified   . Depressive type psychosis   . Sciatica   . Other constipation   . Unspecified essential hypertension   . Arthritis   . Gallstones   . PONV (postoperative nausea and vomiting)    Past Surgical History  Procedure Date  . Abdominal hysterectomy   . Cholecystectomy    Social History:  reports that she has never smoked. She quit smokeless tobacco use about 48 years ago. Her smokeless tobacco use included Snuff. She reports that she does not drink alcohol or use illicit drugs.  Allergies  Allergen Reactions  . Prednisone Other (See Comments)    Delirium   . Sertraline Hcl Other (See Comments)    Delirium     Family History  Problem Relation Age of Onset  . Cancer Other     breast   . Heart disease Other     Prior to Admission medications   Medication Sig Start Date End Date Taking? Authorizing Provider  amoxicillin-clavulanate (AUGMENTIN) 875-125 MG per tablet Take 1 tablet by mouth 2 (two) times daily. 04/01/12  Yes Rosalyn Gess Norins,  MD  cloNIDine (CATAPRES) 0.1 MG tablet Take 0.1 mg by mouth 2 (two) times daily.    Yes Historical Provider, MD  diltiazem (CARDIZEM CD) 120 MG 24 hr capsule Take 120 mg by mouth daily.   Yes Historical Provider, MD  feeding supplement (ENSURE COMPLETE) LIQD Take  237 mLs by mouth 2 (two) times daily between meals. 04/01/12  Yes Jacques Navy, MD  furosemide (LASIX) 40 MG tablet Take 40 mg by mouth daily. 10/04/11  Yes Jacques Navy, MD  gabapentin (NEURONTIN) 100 MG capsule Take 100 mg by mouth at bedtime.    Yes Historical Provider, MD  guaiFENesin-dextromethorphan (ROBITUSSIN DM) 100-10 MG/5ML syrup Take 5 mLs by mouth every 4 (four) hours as needed for cough. 04/01/12  Yes Jacques Navy, MD  levothyroxine (SYNTHROID, LEVOTHROID) 25 MCG tablet TAKE 1 TABLET BY MOUTH ONCE A DAY 01/22/12  Yes Jacques Navy, MD  LORazepam (ATIVAN) 0.5 MG tablet Take 0.5-1 mg by mouth 2 (two) times daily. Take 0.5 mg every morning and 1 mg at bedtime. 11/29/11  Yes Jacques Navy, MD  mirtazapine (REMERON) 30 MG tablet Take 30 mg by mouth at bedtime. 10/07/11  Yes Jacques Navy, MD  omeprazole (PRILOSEC) 40 MG capsule Take 40 mg by mouth 2 (two) times daily.   Yes Historical Provider, MD  oxyCODONE-acetaminophen (PERCOCET) 5-325 MG per tablet Take 1 tablet by mouth every 6 (six) hours as needed. Pain. 09/05/11 09/04/12 Yes Jacques Navy, MD  potassium chloride SA (K-DUR,KLOR-CON) 20 MEQ tablet Take 20 mEq by mouth daily. 10/04/11  Yes Jacques Navy, MD  traZODone (DESYREL) 50 MG tablet Take 25 mg by mouth at bedtime. 10/07/11  Yes Jacques Navy, MD  trimethoprim (TRIMPEX) 100 MG tablet Take 100 mg by mouth daily.  04/25/11  Yes Jacques Navy, MD    Review of Systems:  Constitutional:  No weight loss, night sweats, Fevers, chills, fatigue.  Head&Eyes: No headache.  No vision loss.  No eye pain or scotoma ENT:  No Difficulty swallowing,Tooth/dental problems,Sore throat,  No ear ache, post nasal drip,  Cardio-vascular:  No chest pain, Orthopnea, PND, swelling in lower extremities,  dizziness, palpitations  GI:  No heartburn, indigestion, abdominal pain, nausea, vomiting, diarrhea,loss of appetite, hematochezia, melena Resp:   No excess mucus,  No coughing  up of blood.No change in color of mucus.No wheezing.No chest wall deformity. +dry cough Skin:  no rash or lesions.  GU:  no dysuria, change in color of urine, no urgency or frequency. No flank pain.  Musculoskeletal:  No joint pain or swelling. No decreased range of motion. No back pain.  Psych:  No change in mood or affect. Feeling anxious Neurologic: No headache, no dysesthesia, no focal weakness, no vision loss. No syncope  Physical Exam: Filed Vitals:   04/01/12 1551 04/01/12 1555 04/01/12 1732 04/01/12 2014  BP:  124/57 129/66 168/60  Pulse:  109 102 88  Temp:  98.3 F (36.8 C)    TempSrc:  Oral    Resp:  20 20 24   SpO2: 97% 99% 99% 93%   General:  , NAD, nontoxic, pleasant/cooperative Head/Eye: No conjunctival hemorrhage, no icterus, [place]/AT, No nystagmus ENT:  No icterus,   no pharyngeal exudate Neck:  No masses, no lymphadenpathy, no bruits CV:  RRR, no rub, no gallop, no S3 Lung:  Fine bibasilar wheeze. Good air movement. No rhonchi. No retractions. No use of secondary muscles of respiration. Abdomen: soft/NT, +BS, nondistended, no  peritoneal signs Ext: No cyanosis, No rashes, No petechiae, No lymphangitis, No edema   Labs on Admission:  Basic Metabolic Panel:  Lab 04/01/12 4098 03/31/12 0600 03/30/12 1457  NA 137 135 132*  K 4.2 4.2 --  CL 99 99 95*  CO2 28 29 28   GLUCOSE 131* 136* 123*  BUN 19 20 22   CREATININE 1.01 1.14* 1.43*  CALCIUM 9.3 8.5 8.6  MG -- -- --  PHOS -- -- --   Liver Function Tests:  Lab 04/01/12 1750 03/30/12 1457  AST 13 14  ALT 9 10  ALKPHOS 83 96  BILITOT 0.2* 0.2*  PROT 6.3 5.8*  ALBUMIN 3.0* 2.9*   No results found for this basename: LIPASE:5,AMYLASE:5 in the last 168 hours No results found for this basename: AMMONIA:5 in the last 168 hours CBC:  Lab 04/01/12 1750 03/31/12 0600 03/30/12 1457  WBC 8.4 5.7 5.7  NEUTROABS 6.9 -- 4.2  HGB 11.8* 10.3* 11.0*  HCT 36.0 30.6* 32.7*  MCV 94.2 94.7 94.5  PLT 276 210 245    Cardiac Enzymes:  Lab 03/31/12 0600 03/31/12 0020 03/30/12 1901  CKTOTAL -- -- --  CKMB -- -- --  CKMBINDEX -- -- --  TROPONINI <0.30 <0.30 <0.30   BNP: No components found with this basename: POCBNP:5 CBG: No results found for this basename: GLUCAP:5 in the last 168 hours  Radiological Exams on Admission: Dg Chest 2 View  04/01/2012  *RADIOLOGY REPORT*  Clinical Data: Shortness of breath  CHEST - 2 VIEW  Comparison: Chest x-ray of 03/31/2012  Findings: No active infiltrate or effusion is seen.  There is some peribronchial thickening which may indicate bronchitis. Mediastinal contours are stable.  The heart is within upper limits normal.   The bones are osteopenic and there are degenerative changes throughout the thoracic spine.  IMPRESSION: No active lung disease.  Question bronchitis.   Original Report Authenticated By: Juline Patch, M.D.    Dg Chest 2 View  03/31/2012  *RADIOLOGY REPORT*  Clinical Data: Right upper lobe infiltrate  CHEST - 2 VIEW  Comparison:   the previous day's study  Findings: Interval resolution of the subtle right upper lobe process previously described.  Lungs are clear.  Heart size normal. Vascular clips in the right upper abdomen.  No effusion.  Minimal spondylitic changes in the lower thoracic spine.  IMPRESSION:  No acute disease   Original Report Authenticated By: Osa Craver, M.D.     EKG: Independently reviewed. Sinus, no ST-T wave changes    Time spend:70 minutes  Family Communication: daughter at bedside   Ronnell Clinger, DO  [facility] Pager [phone]  If 7PM-7AM, please contact night-coverage [email or web address] Password [email or web address] 04/01/2012, 9:18 PM

## 2012-04-01 NOTE — Progress Notes (Signed)
[facility] ED CM returned to speak with pt and daughter about processes for ED evaluation, reviewing of labs and imaging to determine need for possible admission, medicare guidelines for admission and Admitting MD evaluation if pt is to be admitted. Discussed further services presently depend on EDP's  reviewing of labs and imaging. Pt and daughter voiced understanding Daughter shared what Assisted living nurse and ems shared with her about pt condition prior to returning to ED.  Daughter inquired about how facility will be notified of pt status prior to d/c.  Informed her of how the ED RN will contact the facility staff to provide a review of the ED stay interventions, medications & treatments provided and pt status as being d/c.  Daughter states pt with hx of chf and pnuemonia/bronchitis Daughter states pt just recently moved from [place] snf to assisted living in which she appealed. Debbora Presto thought her mother should have remained in snf because there was around the clock nursing service that is not provided in assisted living Pt states she was concern when she was unable to stop coughing when returned to the facility CM updated EDP, Bednar that pt and daughter voiced understanding of processes and understands that admission is based upon results of labs and imaging

## 2012-04-01 NOTE — Progress Notes (Signed)
Physical Therapy Treatment Patient Details Name: [name] [name] MRN: [id number] DOB: 1916/07/10 Today's Date: 04/01/2012 Time: 5621-3086 PT Time Calculation (min): 23 min  PT Assessment / Plan / Recommendation Comments on Treatment Session  Mobilizing well. VCs throughout session for pacing, rest. Encouraged rest breaks throughout session-after ambulation, between exercises. Recommend HHPT at ALF    Follow Up Recommendations  Home health PT    Barriers to Discharge        Equipment Recommendations  None recommended by PT    Recommendations for Other Services    Frequency Min 3X/week   Plan Discharge plan remains appropriate    Precautions / Restrictions Precautions Precautions: Fall Restrictions Weight Bearing Restrictions: No   Pertinent Vitals/Pain Pt denies    Mobility  Bed Mobility Bed Mobility: Supine to Sit;Sit to Supine Supine to Sit: 6: Modified independent (Device/Increase time) Sit to Supine: 6: Modified independent (Device/Increase time) Transfers Transfers: Sit to Stand;Stand to Sit Sit to Stand: 4: Min guard;From bed;From toilet;With armrests;With upper extremity assist Stand to Sit: 4: Min guard;To bed;To toilet;With armrests;With upper extremity assist Details for Transfer Assistance: x 2. VCs safety, hand placement.  Ambulation/Gait Ambulation/Gait Assistance: 4: Min assist Ambulation Distance (Feet): 115 Feet Assistive device: Rolling walker Ambulation/Gait Assistance Details: VCSsafety, pacing/speed, distance from RW. Assist to stabilize throughout ambulation. Sats: 99% RA, HR: 104 bpm after ambulation.  Gait Pattern: Trunk flexed;Step-through pattern    Exercises General Exercises - Lower Extremity Ankle Circles/Pumps: AROM;Both;10 reps;Supine Quad Sets: AROM;Both;10 reps;Supine Short Arc Quad: AROM;Both;10 reps;Supine Heel Slides: AROM;Both;10 reps;Supine Hip ABduction/ADduction: AROM;Both;10 reps;Supine   PT Diagnosis:    PT Problem List:    PT Treatment Interventions:     PT Goals Acute Rehab PT Goals Pt will go Sit to Stand: with supervision PT Goal: Sit to Stand - Progress: Progressing toward goal Pt will go Stand to Sit: with supervision PT Goal: Stand to Sit - Progress: Progressing toward goal Pt will Ambulate: >150 feet;with supervision;with rolling walker PT Goal: Ambulate - Progress: Progressing toward goal  Visit Information  Last PT Received On: 04/01/12 Assistance Needed: +1    Subjective Data  Subjective: "I'm going home today" Patient Stated Goal: Back to ALF   Cognition  Overall Cognitive Status: Appears within functional limits for tasks assessed/performed Arousal/Alertness: Awake/alert Orientation Level: Disoriented to;Time Behavior During Session: [facility] for tasks performed    Balance     End of Session PT - End of Session Equipment Utilized During Treatment: Gait belt Activity Tolerance: Patient tolerated treatment well Patient left: in bed;with call bell/phone within reach;with bed alarm set   GP     Rebeca Alert [facility] 04/01/2012, 9:23 AM [phone]

## 2012-04-01 NOTE — Progress Notes (Signed)
Pt's flu shot was held per Dr. Debby Bud who did not want pt to receive vaccine d/t age and current PNA diagnosis.
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
## 2012-04-01 NOTE — Progress Notes (Signed)
Patient cleared for discharge. Packet copied and placed in [place]. ptar called for transportation. Message left with patients daughter informing of d/c.  [name] [name] C. [place]a Janus MSW, LCSW [phone]

## 2012-04-02 DIAGNOSIS — I509 Heart failure, unspecified: Secondary | ICD-10-CM

## 2012-04-02 DIAGNOSIS — R0902 Hypoxemia: Principal | ICD-10-CM

## 2012-04-02 DIAGNOSIS — E46 Unspecified protein-calorie malnutrition: Secondary | ICD-10-CM

## 2012-04-02 DIAGNOSIS — I10 Essential (primary) hypertension: Secondary | ICD-10-CM

## 2012-04-02 LAB — BLOOD GAS, ARTERIAL
Drawn by: 347621
FIO2: 0.21 %
O2 Saturation: 93.2 %
Patient temperature: 98.6
pH, Arterial: 7.49 — ABNORMAL HIGH (ref 7.350–7.450)

## 2012-04-02 LAB — BASIC METABOLIC PANEL
Calcium: 8.5 mg/dL (ref 8.4–10.5)
Creatinine, Ser: 0.94 mg/dL (ref 0.50–1.10)
GFR calc Af Amer: 59 mL/min — ABNORMAL LOW (ref >90)
GFR calc non Af Amer: 51 mL/min — ABNORMAL LOW (ref >90)

## 2012-04-02 LAB — TROPONIN I: Troponin I: <0.3 ng/mL (ref <0.30)

## 2012-04-02 LAB — CBC
Platelets: 254 10*3/uL (ref 150–400)
RDW: 14.6 % (ref 11.5–15.5)
WBC: 6 10*3/uL (ref 4.0–10.5)

## 2012-04-02 MED ORDER — ALBUTEROL SULFATE HFA 108 (90 BASE) MCG/ACT IN AERS
2.0000 | INHALATION_SPRAY | Freq: Four times a day (QID) | RESPIRATORY_TRACT | Status: DC
  Administered 2012-04-02 – 2012-04-06 (×16): 2 via RESPIRATORY_TRACT
  Filled 2012-04-02: qty 6.7

## 2012-04-02 MED ORDER — ALBUTEROL SULFATE (5 MG/ML) 0.5% IN NEBU
INHALATION_SOLUTION | RESPIRATORY_TRACT | Status: AC
  Filled 2012-04-02: qty 0.5

## 2012-04-02 MED ORDER — ALBUTEROL SULFATE (5 MG/ML) 0.5% IN NEBU
2.5000 mg | INHALATION_SOLUTION | RESPIRATORY_TRACT | Status: DC | PRN
  Administered 2012-04-02 – 2012-04-04 (×2): 2.5 mg via RESPIRATORY_TRACT
  Filled 2012-04-02: qty 0.5

## 2012-04-02 MED ORDER — ENOXAPARIN SODIUM 30 MG/0.3ML ~~LOC~~ SOLN
30.0000 mg | SUBCUTANEOUS | Status: DC
  Administered 2012-04-02 – 2012-04-05 (×4): 30 mg via SUBCUTANEOUS
  Filled 2012-04-02 (×5): qty 0.3

## 2012-04-02 NOTE — Progress Notes (Signed)
Pt oxygen sat 94 % on room air.  Ambulated pt and oxygen sats remains 94%.  Dyspnea and wheezing noted with ambulating.  MPennington ,RN

## 2012-04-02 NOTE — Progress Notes (Signed)
Subjective: [name] was d/c yesterday 04/01/12 in stable condition after being admitted with a diagnosis of HAP. She never mustered a leukocytosis, x-ray findings were ambiguous at adm and normal in follow up. She had maintained adequate O2 levels, had no respiratory distress and had clear lungs at discharge. She was to complete a course of oral antibiotics at AL and Home Health care had been arranged. Per history,once she was at her AL facility she became anxious, SOB and developed low O2 sats leading to return to hospital. Admission note reviewed: lab and x-ray were normal, exam revealed mild wheezing. She was given albuterol treatment and IV solumedrol and was readmitted.   This AM She is feeling better. She does report that every time she goes to the assisted living facility she gets short of breath - this has happened before by her report. Now she denies feeling short of breath.  Objective: Lab: Lab Results  Component Value Date   WBC 6.0 04/02/2012   HGB 10.7* 04/02/2012   HCT 32.7* 04/02/2012   MCV 94.5 04/02/2012   PLT 254 04/02/2012   BMET    Component Value Date/Time   NA 136 04/02/2012 0340   K 4.0 04/02/2012 0340   CL 100 04/02/2012 0340   CO2 25 04/02/2012 0340   GLUCOSE 220* 04/02/2012 0340   BUN 19 04/02/2012 0340   CREATININE 0.94 04/02/2012 0340   CALCIUM 8.5 04/02/2012 0340   GFRNONAA 51* 04/02/2012 0340   GFRAA 59* 04/02/2012 0340   ABG 7.49/36.8/64  Cardiac Panel (last 3 results)  Basename 04/02/12 0340 04/01/12 2120 03/31/12 0600  CKTOTAL -- -- --  CKMB -- -- --  TROPONINI <0.30 <0.30 <0.30  RELINDX -- -- --   pBNP 746.9  Lactic acid 1.0   Imaging: CXR 10/2/.13 CHEST - 2 VIEW  Comparison: Chest x-ray of 03/31/2012  Findings: No active infiltrate or effusion is seen. There is some  peribronchial thickening which may indicate bronchitis.  Mediastinal contours are stable. The heart is within upper limits  normal. The bones are osteopenic and there are  degenerative  changes throughout the thoracic spine.  IMPRESSION:  No active lung disease. Question bronchitis.  CXR 03/31/12: CHEST - 2 VIEW  Comparison: the previous day's study  Findings: Interval resolution of the subtle right upper lobe  process previously described. Lungs are clear. Heart size normal.  Vascular clips in the right upper abdomen. No effusion. Minimal  spondylitic changes in the lower thoracic spine.  IMPRESSION:  No acute disease   Scheduled Meds:   . albuterol  2.5 mg Nebulization Q6H  . albuterol  5 mg Nebulization Once  . cloNIDine  0.1 mg Oral BID  . diltiazem  120 mg Oral Daily  . docusate sodium  100 mg Oral BID  . enoxaparin (LOVENOX) injection  40 mg Subcutaneous Q24H  . feeding supplement  237 mL Oral BID BM  . furosemide  40 mg Oral Daily  . gabapentin  100 mg Oral QHS  . ipratropium  0.5 mg Nebulization Once  . levothyroxine  25 mcg Oral QAC breakfast  . LORazepam  0.5-1 mg Oral BID  . methylPREDNISolone (SOLU-MEDROL) injection  125 mg Intravenous Once  . mirtazapine  30 mg Oral QHS  . moxifloxacin  400 mg Intravenous Q24H  . pantoprazole  40 mg Oral Q1200  . sodium chloride  2,000 mL Intravenous Once  . traZODone  25 mg Oral QHS  . trimethoprim  100 mg Oral Daily  Continuous Infusions:   . sodium chloride 125 mL/hr at 03/31/12 1800  . sodium chloride 75 mL/hr at 04/02/12 0010   PRN Meds:.acetaminophen, acetaminophen, guaiFENesin-dextromethorphan, ondansetron (ZOFRAN) IV, ondansetron, oxyCODONE-acetaminophen   Physical Exam: Filed Vitals:   04/02/12 0609  BP: 111/45  Pulse: 79  Temp: 98.8 F (37.1 C)  Resp: 16  O2 sat 97% on 2 l Very elderly white woman in no distress.  HEENT- edentulous, temporal wasting noted Cor- RRR Pulm - normal respiration, no increased WOB, no wheezing in any lung field Abd- soft     Assessment/Plan: 1. Pulmonary - no evidence of bacterial infection: no leukocytosis, fever, infiltrate, cough with  sputum. She has had 2 days Vanc, 3 days of zosyn.  Plan D/c antibiotics with no evidence of infection  D/c nebulizer treatments  MDI treatments with spacer  Check O2 sats on room air  2. Cardiac - negative enzymes. EKG is normal, No chest pain  Plan- d/c tele  Continue home BP meds and diuretics  3. Psych - continue present meds.  4. Malnutrition - continue ensure    [name] [place] [facility] (o) [phone]; (c) [phone] Call-grp - [facility] Tele: [phone]  04/02/2012, 7:59 AM

## 2012-04-02 NOTE — Progress Notes (Signed)
   CARE MANAGEMENT NOTE 04/02/2012  Patient:  [name] [name], [name] [name]   Account Number:  [id number]  Date Initiated:  04/02/2012  Documentation initiated by:  Jiles Crocker  Subjective/Objective Assessment:   ADMITTED WITH SOB; RECENTLY DC TO ALF     Action/Plan:   PCP IS DR NORINS; PATIENT RESIDES IN A NURSING FACILITY; SOCIAL WORKER REFERRAL PLACED   Anticipated DC Date:  04/03/2012   Anticipated DC Plan:  ASSISTED LIVING / REST HOME  In-house referral  Clinical Social Worker      DC Associate Professor  CM consult               Status of service:  In process, will continue to follow Medicare Important Message given?  NA - LOS <3 / Initial given by admissions (If response is "NO", the following Medicare IM given date fields will be blank)  Per UR Regulation:  Reviewed for med. necessity/level of care/duration of stay  Comments:  04/02/2012- B Comer Devins RN, BSN, MHA

## 2012-04-02 NOTE — Progress Notes (Signed)
INITIAL ADULT NUTRITION ASSESSMENT Date: 04/02/2012   Time: 1:47 PM Reason for Assessment: Low BMI   INTERVENTION: Ensure Complete BID. Encouraged continued excellent meal intake. Will monitor.  Pt meets criteria for severe malnutrition of chronic illness AEB pt with severe muscle wasting and fat loss throughout upper extremities, clavicles, and sternal area.   ASSESSMENT: Female 78 y.o.  Dx: Shortness of breath   Food/Nutrition Related Hx: Pt seen by RD 2 days ago during admission. Pt from assisted living facility and reports she was eating "too good" there, 3 meals/day at the dining room. Pt's daughter had reported during previous admission that pt's weight is down 18 pounds in the past year and 3 months. Pt reports she drank Ensure on/off when she lived at home but was not on any PTA. Pt reports wearing top and bottom set of dentures which fit properly and denies any problems chewing or swallowing. Pt with significant cachexia and appears very thin and frail. Pt consumed 100% of lunch and drank 100% of Ensure Complete.   Hx:  Past Medical History  Diagnosis Date  . Routine general medical examination at a health care facility   . Other B-complex deficiencies   . Unspecified hypothyroidism   . Diverticulitis of colon (without mention of hemorrhage)   . Congestive heart failure, unspecified   . Stricture and stenosis of esophagus   . Duodenal ulcer, unspecified as acute or chronic, without hemorrhage, perforation, or obstruction   . Atrial fibrillation   . Edema   . Dysphagia, unspecified   . Depressive type psychosis   . Sciatica   . Other constipation   . Unspecified essential hypertension   . Arthritis   . Gallstones   . PONV (postoperative nausea and vomiting)    Related Meds:  Scheduled Meds:    . albuterol  2 puff Inhalation QID  . albuterol  5 mg Nebulization Once  . cloNIDine  0.1 mg Oral BID  . diltiazem  120 mg Oral Daily  . docusate sodium  100 mg Oral BID  .  enoxaparin (LOVENOX) injection  30 mg Subcutaneous Q24H  . feeding supplement  237 mL Oral BID BM  . furosemide  40 mg Oral Daily  . gabapentin  100 mg Oral QHS  . ipratropium  0.5 mg Nebulization Once  . levothyroxine  25 mcg Oral QAC breakfast  . LORazepam  0.5-1 mg Oral BID  . methylPREDNISolone (SOLU-MEDROL) injection  125 mg Intravenous Once  . mirtazapine  30 mg Oral QHS  . pantoprazole  40 mg Oral Q1200  . sodium chloride  2,000 mL Intravenous Once  . traZODone  25 mg Oral QHS  . trimethoprim  100 mg Oral Daily  . DISCONTD: albuterol  2.5 mg Nebulization Q6H  . DISCONTD: enoxaparin (LOVENOX) injection  40 mg Subcutaneous Q24H  . DISCONTD: moxifloxacin  400 mg Intravenous Q24H   Continuous Infusions:    . sodium chloride 125 mL/hr at 03/31/12 1800  . sodium chloride 75 mL/hr at 04/02/12 0010   PRN Meds:.acetaminophen, acetaminophen, guaiFENesin-dextromethorphan, ondansetron (ZOFRAN) IV, ondansetron, oxyCODONE-acetaminophen  Ht: 5' 6.1" (167.9 cm)  Wt: 110 lb 3.2 oz (49.986 kg)  Ideal Wt: 130 lb % Ideal Wt: 82  Usual Wt: 125 lb in June/July of 2012 per daughter's report % Usual Wt: 85   Body mass index is 17.73 kg/(m^2).   Labs:  CMP     Component Value Date/Time   NA 136 04/02/2012 0340   K 4.0 04/02/2012 0340  CL 100 04/02/2012 0340   CO2 25 04/02/2012 0340   GLUCOSE 220* 04/02/2012 0340   BUN 19 04/02/2012 0340   CREATININE 0.94 04/02/2012 0340   CALCIUM 8.5 04/02/2012 0340   PROT 6.3 04/01/2012 1750   ALBUMIN 3.0* 04/01/2012 1750   AST 13 04/01/2012 1750   ALT 9 04/01/2012 1750   ALKPHOS 83 04/01/2012 1750   BILITOT 0.2* 04/01/2012 1750   GFRNONAA 51* 04/02/2012 0340   GFRAA 59* 04/02/2012 0340    Intake/Output Summary (Last 24 hours) at 04/02/12 1347 Last data filed at 04/02/12 1610  Gross per 24 hour  Intake    968 ml  Output    300 ml  Net    668 ml   Last BM - 9/29  Diet Order: General   IVF:     sodium chloride Last Rate: 125 mL/hr at  03/31/12 1800  sodium chloride Last Rate: 75 mL/hr at 04/02/12 0010    Estimated Nutritional Needs:   Kcal: 1450-1700 Protein: 60-75g Fluid: 1.4-1.7L  NUTRITION DIAGNOSIS: -Underweight (NI-3.1).  Status: Ongoing  RELATED TO: advanced age, worsening cough/shortness of breath  AS EVIDENCE BY: BMI of 17  MONITORING/EVALUATION(Goals): Pt to consume >90% of meals/supplements.   EDUCATION NEEDS: -No education needs identified at this time    Dietitian #: [phone]  DOCUMENTATION CODES Per approved criteria  -Severe malnutrition in the context of chronic illness -Underweight    Marshall Cork 04/02/2012, 1:47 PM

## 2012-04-03 ENCOUNTER — Inpatient Hospital Stay (HOSPITAL_COMMUNITY): Payer: Medicare Other

## 2012-04-03 DIAGNOSIS — I4891 Unspecified atrial fibrillation: Secondary | ICD-10-CM

## 2012-04-03 LAB — CBC WITH DIFFERENTIAL/PLATELET
Basophils Relative: 0 % (ref 0–1)
Eosinophils Absolute: 0 10*3/uL (ref 0.0–0.7)
Eosinophils Relative: 0 % (ref 0–5)
Hemoglobin: 10.9 g/dL — ABNORMAL LOW (ref 12.0–15.0)
Lymphocytes Relative: 16 % (ref 12–46)
MCHC: 33.6 g/dL (ref 30.0–36.0)
Neutrophils Relative %: 76 % (ref 43–77)
RBC: 3.41 MIL/uL — ABNORMAL LOW (ref 3.87–5.11)

## 2012-04-03 MED ORDER — AZITHROMYCIN 250 MG PO TABS
250.0000 mg | ORAL_TABLET | Freq: Every day | ORAL | Status: DC
  Administered 2012-04-04 – 2012-04-06 (×3): 250 mg via ORAL
  Filled 2012-04-03 (×3): qty 1

## 2012-04-03 MED ORDER — AZITHROMYCIN 500 MG PO TABS
500.0000 mg | ORAL_TABLET | Freq: Every day | ORAL | Status: AC
  Administered 2012-04-03: 500 mg via ORAL
  Filled 2012-04-03: qty 1

## 2012-04-03 MED ORDER — VITAMINS A & D EX OINT
TOPICAL_OINTMENT | CUTANEOUS | Status: AC
  Filled 2012-04-03: qty 5

## 2012-04-03 NOTE — Progress Notes (Signed)
Subjective: [name] [name] on exam had a wet cough, she had increased WOB with upper airway wheezing. RN notes reviewed and O2 sats noted - 94% on RA. Patient admits to feeling short of breath.  Objective: Lab: Lab Results  Component Value Date   WBC 6.0 04/02/2012   HGB 10.7* 04/02/2012   HCT 32.7* 04/02/2012   MCV 94.5 04/02/2012   PLT 254 04/02/2012   BMET    Component Value Date/Time   NA 136 04/02/2012 0340   K 4.0 04/02/2012 0340   CL 100 04/02/2012 0340   CO2 25 04/02/2012 0340   GLUCOSE 220* 04/02/2012 0340   BUN 19 04/02/2012 0340   CREATININE 0.94 04/02/2012 0340   CALCIUM 8.5 04/02/2012 0340   GFRNONAA 51* 04/02/2012 0340   GFRAA 59* 04/02/2012 0340     Imaging:  Scheduled Meds:   . albuterol  2 puff Inhalation QID  . albuterol      . azithromycin  500 mg Oral Daily   Followed by  . azithromycin  250 mg Oral Daily  . cloNIDine  0.1 mg Oral BID  . diltiazem  120 mg Oral Daily  . docusate sodium  100 mg Oral BID  . enoxaparin (LOVENOX) injection  30 mg Subcutaneous Q24H  . feeding supplement  237 mL Oral BID BM  . furosemide  40 mg Oral Daily  . gabapentin  100 mg Oral QHS  . levothyroxine  25 mcg Oral QAC breakfast  . LORazepam  0.5-1 mg Oral BID  . mirtazapine  30 mg Oral QHS  . pantoprazole  40 mg Oral Q1200  . traZODone  25 mg Oral QHS  . trimethoprim  100 mg Oral Daily  . DISCONTD: enoxaparin (LOVENOX) injection  40 mg Subcutaneous Q24H   Continuous Infusions:   . sodium chloride 125 mL/hr at 03/31/12 1800  . sodium chloride 75 mL/hr at 04/02/12 0010   PRN Meds:.acetaminophen, acetaminophen, albuterol, guaiFENesin-dextromethorphan, ondansetron (ZOFRAN) IV, ondansetron, oxyCODONE-acetaminophen   Physical Exam: Filed Vitals:   04/03/12 0500  BP: 136/55  Pulse: 72  Temp:   Resp: 18  O2 sats 0- 94%  Gen'l- very eldrly white woman who is uncomfortable with increased WOB HEENT- edentulous, Templar wasting Cor - RRR Pulm - increased WOB, using  accessory muscles of respiration, upper airway wheezing. No wheezing on auscultation lungs, shallow breath sounds, prolonged expiratory phase Abd- soft Neuro - A&O x 3,       Assessment/Plan: 1. PUlmonary - O2 sats are holding in the 94% range at rest and with walking but on exam she has increased WOB , upper airway wheezing. She does not appear stable from respiratory perspective for return to AL Plan Azithromycin for potential atypical respiratory infection  PA-Lat CXR  Continue MDI  3. Psych - does not appear anxious. Spoke with Daughter - she reports her mom has been a little anxious and the moves from home to hospital to snf to AL etc does upset her. Plan - continue present medications  4. Malnutrition - appreciate repeat RD consult Plan - continue supplements  Dispo - at this time patient does not meet SNF criteria based on PT/OT eval but she is not stable enough for AL due to SOB/increased WOB. Will continue to plan for return to AL with [facility] RN/PT? DME - hosp bed and overnight oximetry when ready to transfer.   Time spent: 45 minutes with family conference by phone and evaluation.    Casimiro Needle Norins [place] [facility] (o) [phone]; (c)  [phone] Call-grp - Patsi Sears IM Tele: [phone]  04/03/2012, 9:35 AM

## 2012-04-03 NOTE — Progress Notes (Signed)
CSW spoke with Lupita Leash @ [address] [facility]F (ph#: [phone]) re: patient returning to their facility at discharge. Lupita Leash states they would feel better taking the patient back on Monday rather than over the weekend - MD in agreement per Marcelino Duster, RN. CSW completed FL2 (signed by hospitalist on day of admit) - will follow-up Monday.   Unice Bailey, LCSW [facility] Clinical Social Worker cell #: [phone]

## 2012-04-04 NOTE — Progress Notes (Signed)
Subjective: Easily awakened. C/o breathing difficulty but in no pain.  Objective: Lab: Lab Results  Component Value Date   WBC 11.0* 04/03/2012   HGB 10.9* 04/03/2012   HCT 32.4* 04/03/2012   MCV 95.0 04/03/2012   PLT 308 04/03/2012   BMET    Component Value Date/Time   NA 136 04/02/2012 0340   K 4.0 04/02/2012 0340   CL 100 04/02/2012 0340   CO2 25 04/02/2012 0340   GLUCOSE 220* 04/02/2012 0340   BUN 19 04/02/2012 0340   CREATININE 0.94 04/02/2012 0340   CALCIUM 8.5 04/02/2012 0340   GFRNONAA 51* 04/02/2012 0340   GFRAA 59* 04/02/2012 0340     Imaging: CXR 10/4: Findings: Lung volumes are stable within normal limits. Cardiac  size and mediastinal contours are within normal limits. Visualized  tracheal air column is within normal limits. No pneumothorax,  pleural effusion, pulmonary edema or confluent pulmonary opacity.  Stable osteopenia and visualized osseous structures.  IMPRESSION:  Scheduled Meds:   . albuterol  2 puff Inhalation QID  . azithromycin  500 mg Oral Daily   Followed by  . azithromycin  250 mg Oral Daily  . cloNIDine  0.1 mg Oral BID  . diltiazem  120 mg Oral Daily  . docusate sodium  100 mg Oral BID  . enoxaparin (LOVENOX) injection  30 mg Subcutaneous Q24H  . feeding supplement  237 mL Oral BID BM  . furosemide  40 mg Oral Daily  . gabapentin  100 mg Oral QHS  . levothyroxine  25 mcg Oral QAC breakfast  . LORazepam  0.5-1 mg Oral BID  . mirtazapine  30 mg Oral QHS  . pantoprazole  40 mg Oral Q1200  . traZODone  25 mg Oral QHS  . trimethoprim  100 mg Oral Daily  . vitamin A & D       Continuous Infusions:   . sodium chloride 125 mL/hr at 03/31/12 1800  . sodium chloride 75 mL/hr at 04/02/12 0010   PRN Meds:.acetaminophen, acetaminophen, albuterol, guaiFENesin-dextromethorphan, ondansetron (ZOFRAN) IV, ondansetron, oxyCODONE-acetaminophen   Physical Exam: Filed Vitals:   04/04/12 0650  BP: 140/58  Pulse: 74  Temp: 97.6 F (36.4 C)  Resp: 18      Intake/Output Summary (Last 24 hours) at 04/04/12 0948 Last data filed at 04/04/12 0981  Gross per 24 hour  Intake    560 ml  Output    275 ml  Net    285 ml   Very elderly white woman in no distress but with upper airway wheezing Cor- 2+ radial, RRR PUlm - no increased WOB at rest while sleeping or awake. Lungs are clear. Abd- soft Neuro/psych - seems stable      Assessment/Plan: 1. Pulmonary - O2 sats stable. CXR clear  Plan - continue present treatments  2-4) stable w/o change  Dispo for AL on Monday if stable.    [name] [place] [facility] (o) [phone]; (c) [phone] Call-grp - Patsi Sears IM Tele: [phone]  04/04/2012, 9:43 AM

## 2012-04-04 NOTE — Progress Notes (Signed)
Coughing dry cough, becoming SOB when first observed this a.m.,.  Placed HOB up, Neb tx given by respiratory, and later robitussin given.  Became more calm, cough decreased.  Seems to have episodes of cough with SOB at random.  Routine ativan dose given. Seemed to be much more calm and breathing with more ease afterwards.

## 2012-04-05 DIAGNOSIS — F323 Major depressive disorder, single episode, severe with psychotic features: Secondary | ICD-10-CM

## 2012-04-05 LAB — CULTURE, BLOOD (SINGLE)

## 2012-04-05 LAB — CULTURE, BLOOD (ROUTINE X 2): Culture: NO GROWTH

## 2012-04-05 NOTE — Progress Notes (Signed)
Subjective: RN note reviewed: episode of coughing and SOB. At exam she is comfortable and doing well.   Objective: Lab: Lab Results  Component Value Date   WBC 11.0* 04/03/2012   HGB 10.9* 04/03/2012   HCT 32.4* 04/03/2012   MCV 95.0 04/03/2012   PLT 308 04/03/2012   BMET    Component Value Date/Time   NA 136 04/02/2012 0340   K 4.0 04/02/2012 0340   CL 100 04/02/2012 0340   CO2 25 04/02/2012 0340   GLUCOSE 220* 04/02/2012 0340   BUN 19 04/02/2012 0340   CREATININE 0.94 04/02/2012 0340   CALCIUM 8.5 04/02/2012 0340   GFRNONAA 51* 04/02/2012 0340   GFRAA 59* 04/02/2012 0340     Imaging: No new imaging  Scheduled Meds:   . albuterol  2 puff Inhalation QID  . azithromycin  250 mg Oral Daily  . cloNIDine  0.1 mg Oral BID  . diltiazem  120 mg Oral Daily  . docusate sodium  100 mg Oral BID  . enoxaparin (LOVENOX) injection  30 mg Subcutaneous Q24H  . feeding supplement  237 mL Oral BID BM  . furosemide  40 mg Oral Daily  . gabapentin  100 mg Oral QHS  . levothyroxine  25 mcg Oral QAC breakfast  . LORazepam  0.5-1 mg Oral BID  . mirtazapine  30 mg Oral QHS  . pantoprazole  40 mg Oral Q1200  . traZODone  25 mg Oral QHS  . trimethoprim  100 mg Oral Daily   Continuous Infusions:   . sodium chloride 125 mL/hr at 03/31/12 1800  . sodium chloride 75 mL/hr at 04/02/12 0010   PRN Meds:.acetaminophen, acetaminophen, albuterol, guaiFENesin-dextromethorphan, ondansetron (ZOFRAN) IV, ondansetron, oxyCODONE-acetaminophen   Physical Exam: Filed Vitals:   04/05/12 0907  BP: 148/60  Pulse:   Temp:   Resp:    Very elderly white woman sitting up in bed in no distress Cor- RRR Pulm - good breath sounds without rales or wheezing. No increased WOB Abd - soft      Assessment/Plan: 1-4) stable. No underlying pathologic cause for episodes of SOB and agitation. Suspect this is all psychiatric with negative eval to day.  Plan - return to AL tomorrow.   [name] [place] IM (o)  [phone]; (c) [phone] Call-grp - Patsi Sears IM Tele: [phone]  04/05/2012, 10:44 AM

## 2012-04-06 NOTE — Discharge Summary (Signed)
NAMEMarland [name]  MILANY, GECK NO.:  [id number]  MEDICAL RECORD NO.:  [id number]  LOCATION:  1422                         FACILITY:  [facility]  PHYSICIAN:  Rosalyn Gess. Lagena Strand, MD  DATE OF BIRTH:  1916/10/09  DATE OF ADMISSION:  04/01/2012 DATE OF DISCHARGE:  04/06/2012                              DISCHARGE SUMMARY   ADMITTING DIAGNOSES: 1. Shortness of breath with transient hypoxemia. 2. History of recent pneumonia. 3. Hypothyroid disease. 4. Anxiety, depression with psychotic features. 5. Chronic diastolic heart failure.  DISCHARGE DIAGNOSES: 1. Shortness of breath with transient hypoxemia. 2. History of recent pneumonia. 3. Hypothyroid disease. 4. Anxiety, depression with psychotic features. 5. Chronic diastolic heart failure.  CONSULTANTS:  None.  PROCEDURES:  Chest x-ray at admission, which showed no active lung disease.  Question of early bronchitis.  Chest x-ray October 4 which showed no acute cardiopulmonary abnormality.  HISTORY OF PRESENT ILLNESS:  [name] is a 78 year old woman who had been admitted to the hospital with a question of hospital/facility acquired pneumonia.  She had no leukocytosis, no fever.  Chest x-ray was unremarkable without infiltrates.  She was initially treated with triple antibiotics which was reduced to Zosyn alone on the third hospital day, which she was stable with no signs of active infection.  She was felt to be stable for return to assisted living to complete a course of antibiotics using Augmentin.  The patient was transported to [facility] for assisted living facility as soon after arrival, complained of being short of breath. She was noted to be very anxious.  By report she had relative hypoxemia and was therefore transported back to [facility] Emergency Department. Her ED evaluation revealed clear lungs, normal labs.  She was transiently mildly hypoxemic but this quickly returned to normal  oxygen levels.  She was subsequently admitted because of shortness of breath. She was restarted on antibiotics for the question of possible infection, although again white count was normal.  No infiltrates on x-ray. Please see the H and P as well as recent epic documents for past medical history, family history, social history.  HOSPITAL COURSE: 1. Shortness of breath.  The patient was re-evaluated with x-rays as     noted.  Her lab work remained unremarkable with no obvious signs of     infection.  The patient was put to room air and had O2 sats checked     both at rest and with ambulation where she read out at 94%.  Of     note, the patient had several episodes where she would become     anxious and complained of being short of breath and was given     breathing treatments, although her oxygen saturations never really     dropped.  She also responded well to angiolytic medication.  The     patient demonstrated no evidence of infection.  Pulmonary function     did seem to be normal most of the time.  Of note, the patient does     frequently demonstrate upper airway wheezing which is forcing air     against a closed glottis and during those episodes, her lungs will  remain clear.  With the patient having good oxygen saturation with     lungs being clear with no sign of infection with no need for     further hospital evaluation at this point, she is stable and ready     to be discharged back to assisted living 2. Plan at assisted living would be for her to be followed by [facility].  They would do overnight pulse oximetry.  They will also     provide PT, OT, and nursing services.  Face-to-face encounter form     is completed at time of discharge.  DISCHARGE EXAMINATION:  VITAL SIGNS:  Temperature was 97.4, blood pressure 131/68, pulse 83, respirations 17, O2 sats 96% on room air. GENERAL APPEARANCE:  This is a wizened elderly white woman, who is very thin and malnourished in no  acute distress. HEENT:  Mild temporal wasting.  Conjunctivae and sclerae were clear. Pupils equal, round, and reactive.  The patient is edentulous. NECK:  Supple. CHEST:  The patient is moving air well.  She had no rales or wheezes on auscultation of her lungs.  She does demonstrate high-pitched wheezing in the area of her throat and upper chest when she forcibly exhales. CARDIOVASCULAR:  2+ radial pulse.  Her precordium is quiet.  She had a regular rate and rhythm. ABDOMEN:  Soft.  No guarding or rebound was noted. EXTREMITIES:  The patient is very thin.  She has no obvious deformities. NEUROLOGIC:  The patient is awake.  She is alert.  She is oriented to person, place, time, and context. SKIN:  The patient's skin is clear with no significant lesions noted.  FINAL LABORATORY:  Last labs from October 4 with [place] of 16[address], hemoglobin 10.9 g, platelet count 308,000.  Differential was normal. Final chemistries from October 3 with a sodium of 136, potassium of 4, chloride 100, CO2 of 25, BUN of 19, creatinine 0.94, glucose was 220. Troponins were negative x2.  Final imaging as noted.  DISPOSITION:  The patient is to be discharged to assisted living.  We will have [facility] assist with management of the patient in terms of recovery.  Because of her malnutrition, she will need to have Ensure 3 times a day.  The patient's condition at time of discharge dictation is guarded given her advanced age and multiple comorbidities. The patient will be seen in the office for followup in 7-10 days.     Rosalyn Gess Bretton Tandy, MD     MEN/MEDQ  D:  04/06/2012  T:  04/06/2012  Job:  [id number]

## 2012-04-06 NOTE — Progress Notes (Signed)
Patient is set to discharge back to [facility] ALF today. CSW confirmed with Lupita Leash @ ALF that patient is ok to return - chart copy packet left in [place]. Patient's daughter, Steward Drone (c#: [phone]) to transport patient around lunch.   Unice Bailey, LCSW [facility] Clinical Social Worker cell #: [phone]

## 2012-04-06 NOTE — Progress Notes (Signed)
Patiient has been stable. CXR normal. O2 sats at rest and with ambulation have been greater than 90%. At this time she is ready for d/c to AL. Please note she will become short of breath and have upper airway wheezing when agitated.  Dictated # [id number]

## 2012-04-07 ENCOUNTER — Telehealth: Payer: Self-pay | Admitting: Internal Medicine

## 2012-04-07 NOTE — Telephone Encounter (Signed)
Claris Gower from [facility] is calling because patient was originally going to receive care through [facility] but daughter is going to use their services.  They are assisted living that also has home health.  They need patient orders given to them.  Please call.  If medication orders can please be faxed versus verbal over the phone.  Fax number is [phone].  They have pt inhalers that she is to use but no orders for those either.

## 2012-04-08 LAB — CULTURE, BLOOD (ROUTINE X 2): Culture: NO GROWTH

## 2012-04-08 NOTE — Telephone Encounter (Signed)
1. Please have the case manager at [facility] hospital forward the f-f form and the home health orders done at the time of Elinor 's discharge. 2. Advair 250/50 1 inhalation twice a day (AM and bedtime) on schedule; Proventil HFA two puffs q 4 hrs prn break through wheezing.

## 2012-04-09 MED ORDER — FLUTICASONE-SALMETEROL 250-50 MCG/DOSE IN AEPB: 1.0000 | INHALATION_SPRAY | Freq: Two times a day (BID) | RESPIRATORY_TRACT | Status: DC

## 2012-04-09 MED ORDER — ALBUTEROL SULFATE HFA 108 (90 BASE) MCG/ACT IN AERS: 2.0000 | INHALATION_SPRAY | RESPIRATORY_TRACT | Status: DC | PRN

## 2012-04-09 NOTE — Telephone Encounter (Signed)
Orders for advair and pdroventil printed and to be signed and faxed to [facility]. Called case manager at [facility]. Stated that the doctors were the only ones who could get the F-F forms and the home health orders for patients. They do not have access to this.

## 2012-04-10 ENCOUNTER — Telehealth: Payer: Self-pay | Admitting: Internal Medicine

## 2012-04-10 NOTE — Telephone Encounter (Signed)
Last of several messages:had done home health orders at discharge. Had advised if they do their own home health to contact case manager at [facility] for a copy of the orders and F2F form done at discharge. Please remind them of this instruction.  May have a verbal order for home health RN, PT and resp therapy.  This was not tended to during working hours while I was out of the office and will be called in Monday, Oct 14

## 2012-04-10 NOTE — Telephone Encounter (Signed)
Lupita Leash from [facility] called and is hoping to get verbal orders for the [facility] nurse.  She states that without these orders, the patient would not be able to stay in the facility.  Her callback number is [phone].

## 2012-04-14 NOTE — Telephone Encounter (Signed)
Left message for [name] [name] at [facility] to return call to make sure she got all paper work and orders of patient.

## 2012-04-14 NOTE — Telephone Encounter (Signed)
  Lupita Leash at [facility] called and stated she had all paperwork needed for patient now.

## 2012-04-15 ENCOUNTER — Telehealth: Payer: Self-pay | Admitting: *Deleted

## 2012-04-15 NOTE — Telephone Encounter (Signed)
Occupational Therapist Home Health at [facility] completed evaluation on patient and requesting verbal order for visit 1 x week, 2 visits a week for 3 weeks for safety of patient. Please advise.

## 2012-04-16 NOTE — Telephone Encounter (Signed)
ok 

## 2012-04-16 NOTE — Telephone Encounter (Signed)
Verbal order given per Dr.Norins for OT for patient at [facility].. To Aram Beecham therapist

## 2012-04-27 ENCOUNTER — Other Ambulatory Visit: Payer: Self-pay | Admitting: *Deleted

## 2012-04-27 DIAGNOSIS — R269 Unspecified abnormalities of gait and mobility: Secondary | ICD-10-CM

## 2012-04-27 DIAGNOSIS — I509 Heart failure, unspecified: Secondary | ICD-10-CM

## 2012-04-27 DIAGNOSIS — J189 Pneumonia, unspecified organism: Secondary | ICD-10-CM

## 2012-04-27 DIAGNOSIS — M6281 Muscle weakness (generalized): Secondary | ICD-10-CM

## 2012-04-27 MED ORDER — LORAZEPAM 0.5 MG PO TABS: 0.5000 mg | ORAL_TABLET | Freq: Two times a day (BID) | ORAL | Status: DC

## 2012-04-27 NOTE — Telephone Encounter (Signed)
Hard copy of Rx Lorazapam 0.5mg  printed to be signed to fax to [facility]

## 2012-05-25 ENCOUNTER — Ambulatory Visit: Payer: Medicare Other | Admitting: Internal Medicine

## 2012-05-25 ENCOUNTER — Telehealth: Payer: Self-pay | Admitting: Internal Medicine

## 2012-05-25 NOTE — Telephone Encounter (Signed)
Tomorrow is good

## 2012-05-25 NOTE — Telephone Encounter (Signed)
The patient's daughter called and stated the patient is having leg swelling.  She is scheduled for tomorrow, but wanted to make sure you did not prefer her to come in sooner.  If you would like her to come in today, i'll be happy to call and move her apt.    Thanks!

## 2012-05-26 ENCOUNTER — Ambulatory Visit (INDEPENDENT_AMBULATORY_CARE_PROVIDER_SITE_OTHER): Payer: Medicare Other | Admitting: Internal Medicine

## 2012-05-26 ENCOUNTER — Encounter: Payer: Self-pay | Admitting: Internal Medicine

## 2012-05-26 VITALS — BP 112/58 | HR 81 | Temp 97.8°F | Resp 8

## 2012-05-26 DIAGNOSIS — R609 Edema, unspecified: Secondary | ICD-10-CM

## 2012-05-26 DIAGNOSIS — S61419A Laceration without foreign body of unspecified hand, initial encounter: Secondary | ICD-10-CM

## 2012-05-26 DIAGNOSIS — S61409A Unspecified open wound of unspecified hand, initial encounter: Secondary | ICD-10-CM

## 2012-05-26 NOTE — Patient Instructions (Addendum)
Orders for in-house Home Health Care: Wound care left hand: Daily  Soak in warm water with just a little betadine 10 minutes  Rinse with warm water  After carefully drying - apply non-adherent dressing and hold in place with 2 " Kerlex.  Wound care left leg: twice a week  Gently wash with soap and water  Rinse and let dry  Apply non - adherent dressing and hold in place with paper tape  Low extremity edema - most likely venous insufficiency Plan Resume lasix once a day  At least twice a day elevate the legs for 30 minutes - laying down!  Over the next 2 weeks apply a modified Unna boot to both legs:  4 inch kerlex ankle to knee then over- wrapped with 6" Coban. This should be snug but not tight. Best to have this applied by a home health nurse. (if you need a referral to a home health agency let me know)

## 2012-05-27 ENCOUNTER — Other Ambulatory Visit: Payer: Self-pay | Admitting: *Deleted

## 2012-05-27 MED ORDER — ENSURE COMPLETE PO LIQD: 237.0000 mL | Freq: Two times a day (BID) | ORAL | Status: DC

## 2012-05-27 NOTE — Telephone Encounter (Signed)
R'cd fax from [facility] for refill of Ensure.

## 2012-05-29 NOTE — Assessment & Plan Note (Signed)
Low extremity edema - most likely venous insufficiency Plan Resume lasix once a day  At least twice a day elevate the legs for 30 minutes - laying down!  Over the next 2 weeks apply a modified Unna boot to both legs:  4 inch kerlex ankle to knee then over- wrapped with 6" Coban. This should be snug but not tight. Best to have this applied by a home health nurse. (if you need a referral to a home health agency let me know)

## 2012-05-29 NOTE — Progress Notes (Signed)
Subjective:    Patient ID: [name] [name], female    DOB: 1916/12/16, 78 y.o.   MRN: [id number]  HPI [name] [name] presents for evaluation of lower extremity edema and check of two minor wounds to the left distal lower extremity and a skin tear left hand at the space between the thumb and index finger. The wounds were obtained when she fell without lLOC or other injury. She has no complaints but her daughter is very concerned about the use of daytime oxygen. She is already using oxygen at night based on oximetry that revealed relative hypoxemia. She denies any chest pain, SOB, leg pain.  . Past Medical History  Diagnosis Date  . Routine general medical examination at a health care facility   . Other B-complex deficiencies   . Unspecified hypothyroidism   . Diverticulitis of colon (without mention of hemorrhage)   . Congestive heart failure, unspecified   . Stricture and stenosis of esophagus   . Duodenal ulcer, unspecified as acute or chronic, without hemorrhage, perforation, or obstruction   . Atrial fibrillation   . Edema   . Dysphagia, unspecified   . Depressive type psychosis   . Sciatica   . Other constipation   . Unspecified essential hypertension   . Arthritis   . Gallstones   . PONV (postoperative nausea and vomiting)    Past Surgical History  Procedure Date  . Abdominal hysterectomy   . Cholecystectomy    Family History  Problem Relation Age of Onset  . Cancer Other     breast   . Heart disease Other    History   Social History  . Marital Status: Widowed    Spouse Name: N/A    Number of Children: N/A  . Years of Education: N/A   Occupational History  . Not on file.   Social History Main Topics  . Smoking status: Never Smoker   . Smokeless tobacco: Former Neurosurgeon    Types: Snuff    Quit date: 01/13/1964  . Alcohol Use: No  . Drug Use: No  . Sexually Active: No   Other Topics Concern  . Not on file   Social History Narrative   Widowed.Lives  with daughter, dependent for ADL's/ Home health coming in (9/09)Discussed code status: will not do CPR, or mechanical ventilation.    Current Outpatient Prescriptions on File Prior to Visit  Medication Sig Dispense Refill  . albuterol (PROVENTIL HFA;VENTOLIN HFA) 108 (90 BASE) MCG/ACT inhaler Inhale 2 puffs into the lungs every 4 (four) hours as needed. For break through wheeziing  18 g  3  . cloNIDine (CATAPRES) 0.1 MG tablet Take 0.1 mg by mouth 2 (two) times daily.       Marland Kitchen diltiazem (CARDIZEM CD) 120 MG 24 hr capsule Take 120 mg by mouth daily.      Marland Kitchen diltiazem (TIAZAC) 120 MG 24 hr capsule       . Fluticasone-Salmeterol (ADVAIR DISKUS) 250-50 MCG/DOSE AEPB Inhale 1 puff into the lungs 2 (two) times daily. In the AM and at BEDTIME  60 each  3  . furosemide (LASIX) 40 MG tablet Take 40 mg by mouth daily.      Marland Kitchen gabapentin (NEURONTIN) 100 MG capsule Take 100 mg by mouth at bedtime.       Marland Kitchen guaiFENesin-dextromethorphan (ROBITUSSIN DM) 100-10 MG/5ML syrup Take 5 mLs by mouth every 4 (four) hours as needed for cough.  118 mL    . levothyroxine (SYNTHROID, LEVOTHROID) 25 MCG tablet TAKE  1 TABLET BY MOUTH ONCE A DAY  30 tablet  6  . LORazepam (ATIVAN) 0.5 MG tablet Take 1-2 tablets (0.5-1 mg total) by mouth 2 (two) times daily. Take 0.5 mg every morning and 1 mg at bedtime.  60 tablet  5  . mirtazapine (REMERON) 30 MG tablet Take 30 mg by mouth at bedtime.      Marland Kitchen omeprazole (PRILOSEC) 40 MG capsule Take 40 mg by mouth 2 (two) times daily.      Marland Kitchen oxyCODONE-acetaminophen (PERCOCET) 5-325 MG per tablet Take 1 tablet by mouth every 6 (six) hours as needed. Pain.      . potassium chloride SA (K-DUR,KLOR-CON) 20 MEQ tablet Take 20 mEq by mouth daily.      . SYMBICORT 80-4.5 MCG/ACT inhaler       . traZODone (DESYREL) 50 MG tablet Take 25 mg by mouth at bedtime.      Marland Kitchen trimethoprim (TRIMPEX) 100 MG tablet Take 100 mg by mouth daily.            Review of Systems System review is negative for any  constitutional, cardiac, pulmonary, GI or neuro symptoms or complaints other than as described in the HPI.     Objective:   Physical Exam Filed Vitals:   05/26/12 1531  BP: 112/58  Pulse: 81  Temp: 97.8 F (36.6 C)  Resp: 8   gen'l - elderly white woman in no acute distress HEENT - C&S clear Cor - 2+ radial, RRR, 2+ edema to level just below the knee Pulm - no increased WOB, no rales or wheezes Neuro - A&O x 3 Derm: 3x3 cm skin tear dorsal aspect left hand between thumb.  Distal left LE to the lateral aspect 2 cm skin tear/abrasion. Second lesion 1x1 cm to medial aspect.        Assessment & Plan:  1. Orders for in-house Home Health Care: Wound care left hand: Daily  Soak in warm water with just a little betadine 10 minutes  Rinse with warm water  After carefully drying - apply non-adherent dressing and hold in place with 2 " Kerlex.  Wound care left leg: twice a week  Gently wash with soap and water  Rinse and let dry  Apply non - adherent dressing and hold in place with paper tape

## 2012-06-02 ENCOUNTER — Telehealth: Payer: Self-pay | Admitting: *Deleted

## 2012-06-02 NOTE — Telephone Encounter (Signed)
Speech Therapist evaluated pt today and wants to continue with orders for 2x/week x 4. Pt is cough and clearing her throat while eating.

## 2012-06-02 NOTE — Telephone Encounter (Signed)
Ok to extend service 2 weeks.

## 2012-06-03 NOTE — Telephone Encounter (Signed)
[name] [name] at [facility] informed of MD's advisement regarding orders. They want to know if it is also okay to extend services for PT since pt had a fall recently.

## 2012-06-03 NOTE — Telephone Encounter (Signed)
Vernona Rieger at [facility] informed of MD's advisement.

## 2012-06-03 NOTE — Telephone Encounter (Signed)
Has had in-patient PT eval. She is very elderly and frail and I do not believe she will benefit from PT eval. She should be ambulated with staff assistance, use usual fall prevention procedures at all times.

## 2012-06-04 ENCOUNTER — Inpatient Hospital Stay (HOSPITAL_COMMUNITY)
Admission: EM | Admit: 2012-06-04 | Discharge: 2012-06-10 | DRG: 202 | Disposition: A | Discharge status: Skilled Nursing Facility | Payer: Medicare Other | Attending: Internal Medicine | Admitting: Internal Medicine

## 2012-06-04 ENCOUNTER — Emergency Department (HOSPITAL_COMMUNITY): Payer: Medicare Other

## 2012-06-04 ENCOUNTER — Encounter (HOSPITAL_COMMUNITY): Payer: Self-pay

## 2012-06-04 DIAGNOSIS — L8992 Pressure ulcer of unspecified site, stage 2: Secondary | ICD-10-CM | POA: Diagnosis present

## 2012-06-04 DIAGNOSIS — I509 Heart failure, unspecified: Secondary | ICD-10-CM | POA: Diagnosis present

## 2012-06-04 DIAGNOSIS — I4891 Unspecified atrial fibrillation: Secondary | ICD-10-CM | POA: Diagnosis present

## 2012-06-04 DIAGNOSIS — L89109 Pressure ulcer of unspecified part of back, unspecified stage: Secondary | ICD-10-CM | POA: Diagnosis present

## 2012-06-04 DIAGNOSIS — J209 Acute bronchitis, unspecified: Principal | ICD-10-CM | POA: Diagnosis present

## 2012-06-04 DIAGNOSIS — L97809 Non-pressure chronic ulcer of other part of unspecified lower leg with unspecified severity: Secondary | ICD-10-CM | POA: Diagnosis present

## 2012-06-04 DIAGNOSIS — J189 Pneumonia, unspecified organism: Secondary | ICD-10-CM

## 2012-06-04 DIAGNOSIS — E039 Hypothyroidism, unspecified: Secondary | ICD-10-CM | POA: Diagnosis present

## 2012-06-04 DIAGNOSIS — R0902 Hypoxemia: Secondary | ICD-10-CM

## 2012-06-04 DIAGNOSIS — I5033 Acute on chronic diastolic (congestive) heart failure: Secondary | ICD-10-CM | POA: Diagnosis present

## 2012-06-04 DIAGNOSIS — M549 Dorsalgia, unspecified: Secondary | ICD-10-CM

## 2012-06-04 DIAGNOSIS — E860 Dehydration: Secondary | ICD-10-CM | POA: Diagnosis present

## 2012-06-04 DIAGNOSIS — I10 Essential (primary) hypertension: Secondary | ICD-10-CM | POA: Diagnosis present

## 2012-06-04 LAB — BASIC METABOLIC PANEL
BUN: 20 mg/dL (ref 6–23)
Creatinine, Ser: 1.1 mg/dL (ref 0.50–1.10)
GFR calc Af Amer: 49 mL/min — ABNORMAL LOW (ref >90)
GFR calc non Af Amer: 42 mL/min — ABNORMAL LOW (ref >90)
Potassium: 3.6 mEq/L (ref 3.5–5.1)

## 2012-06-04 LAB — URINALYSIS, ROUTINE W REFLEX MICROSCOPIC
Bilirubin Urine: NEGATIVE
Ketones, ur: NEGATIVE mg/dL
Nitrite: NEGATIVE
Urobilinogen, UA: 0.2 mg/dL (ref 0.0–1.0)

## 2012-06-04 LAB — DIFFERENTIAL
Basophils Absolute: 0 10*3/uL (ref 0.0–0.1)
Eosinophils Relative: 0 % (ref 0–5)
Lymphocytes Relative: 14 % (ref 12–46)
Monocytes Absolute: 0.5 10*3/uL (ref 0.1–1.0)

## 2012-06-04 LAB — PRO B NATRIURETIC PEPTIDE: Pro B Natriuretic peptide (BNP): 1391 pg/mL — ABNORMAL HIGH (ref 0–450)

## 2012-06-04 LAB — URINE MICROSCOPIC-ADD ON

## 2012-06-04 LAB — LACTIC ACID, PLASMA: Lactic Acid, Venous: 2 mmol/L (ref 0.5–2.2)

## 2012-06-04 LAB — CBC
HCT: 30.9 % — ABNORMAL LOW (ref 36.0–46.0)
MCH: 31.3 pg (ref 26.0–34.0)
MCHC: 32.7 g/dL (ref 30.0–36.0)
MCV: 95.7 fL (ref 78.0–100.0)
RDW: 13.7 % (ref 11.5–15.5)
WBC: 5 10*3/uL (ref 4.0–10.5)

## 2012-06-04 MED ORDER — SODIUM CHLORIDE 0.9 % IV SOLN
INTRAVENOUS | Status: DC
  Administered 2012-06-04: 09:00:00 via INTRAVENOUS

## 2012-06-04 MED ORDER — LEVOFLOXACIN IN D5W 500 MG/100ML IV SOLN
500.0000 mg | Freq: Once | INTRAVENOUS | Status: AC
  Administered 2012-06-04: 500 mg via INTRAVENOUS
  Filled 2012-06-04: qty 100

## 2012-06-04 MED ORDER — POTASSIUM CHLORIDE CRYS ER 20 MEQ PO TBCR
20.0000 meq | EXTENDED_RELEASE_TABLET | Freq: Every day | ORAL | Status: DC
  Administered 2012-06-04 – 2012-06-10 (×7): 20 meq via ORAL
  Filled 2012-06-04 (×7): qty 1

## 2012-06-04 MED ORDER — DILTIAZEM HCL ER BEADS 120 MG PO CP24
120.0000 mg | ORAL_CAPSULE | Freq: Every day | ORAL | Status: DC
  Filled 2012-06-04: qty 1

## 2012-06-04 MED ORDER — SODIUM CHLORIDE 0.9 % IJ SOLN: 3.0000 mL | INTRAMUSCULAR | Status: DC | PRN

## 2012-06-04 MED ORDER — CLONIDINE HCL 0.1 MG PO TABS
0.1000 mg | ORAL_TABLET | Freq: Two times a day (BID) | ORAL | Status: DC
  Administered 2012-06-04 – 2012-06-10 (×11): 0.1 mg via ORAL
  Filled 2012-06-04 (×14): qty 1

## 2012-06-04 MED ORDER — ALBUTEROL SULFATE (5 MG/ML) 0.5% IN NEBU
INHALATION_SOLUTION | RESPIRATORY_TRACT | Status: AC
  Filled 2012-06-04: qty 2

## 2012-06-04 MED ORDER — ENSURE COMPLETE PO LIQD
237.0000 mL | Freq: Two times a day (BID) | ORAL | Status: DC
  Administered 2012-06-05 – 2012-06-10 (×10): 237 mL via ORAL
  Filled 2012-06-04: qty 237

## 2012-06-04 MED ORDER — FUROSEMIDE 10 MG/ML IJ SOLN
40.0000 mg | Freq: Once | INTRAMUSCULAR | Status: AC
  Administered 2012-06-04: 40 mg via INTRAVENOUS
  Filled 2012-06-04: qty 4

## 2012-06-04 MED ORDER — ENOXAPARIN SODIUM 30 MG/0.3ML ~~LOC~~ SOLN
30.0000 mg | SUBCUTANEOUS | Status: DC
  Administered 2012-06-04 – 2012-06-09 (×6): 30 mg via SUBCUTANEOUS
  Filled 2012-06-04 (×7): qty 0.3

## 2012-06-04 MED ORDER — SODIUM CHLORIDE 0.9 % IV SOLN
250.0000 mL | INTRAVENOUS | Status: DC | PRN
  Administered 2012-06-05: 250 mL via INTRAVENOUS

## 2012-06-04 MED ORDER — TRAZODONE 25 MG HALF TABLET
25.0000 mg | ORAL_TABLET | Freq: Every day | ORAL | Status: DC
  Administered 2012-06-04 – 2012-06-09 (×6): 25 mg via ORAL
  Filled 2012-06-04 (×7): qty 1

## 2012-06-04 MED ORDER — LEVOFLOXACIN IN D5W 250 MG/50ML IV SOLN
250.0000 mg | INTRAVENOUS | Status: DC
  Administered 2012-06-05 – 2012-06-06 (×2): 250 mg via INTRAVENOUS
  Filled 2012-06-04 (×3): qty 50

## 2012-06-04 MED ORDER — LEVOTHYROXINE SODIUM 25 MCG PO TABS
25.0000 ug | ORAL_TABLET | Freq: Every day | ORAL | Status: DC
  Administered 2012-06-05 – 2012-06-10 (×6): 25 ug via ORAL
  Filled 2012-06-04 (×8): qty 1

## 2012-06-04 MED ORDER — OXYCODONE-ACETAMINOPHEN 5-325 MG PO TABS
1.0000 | ORAL_TABLET | Freq: Four times a day (QID) | ORAL | Status: DC | PRN
  Administered 2012-06-04 – 2012-06-08 (×9): 1 via ORAL
  Filled 2012-06-04 (×10): qty 1

## 2012-06-04 MED ORDER — GUAIFENESIN-DM 100-10 MG/5ML PO SYRP
5.0000 mL | ORAL_SOLUTION | ORAL | Status: DC | PRN
  Administered 2012-06-07 – 2012-06-10 (×3): 5 mL via ORAL
  Filled 2012-06-04 (×3): qty 10

## 2012-06-04 MED ORDER — IPRATROPIUM BROMIDE 0.02 % IN SOLN
0.5000 mg | RESPIRATORY_TRACT | Status: DC
  Administered 2012-06-04 – 2012-06-05 (×7): 0.5 mg via RESPIRATORY_TRACT
  Filled 2012-06-04 (×7): qty 2.5

## 2012-06-04 MED ORDER — ALBUTEROL (5 MG/ML) CONTINUOUS INHALATION SOLN
10.0000 mg/h | INHALATION_SOLUTION | Freq: Once | RESPIRATORY_TRACT | Status: AC
  Administered 2012-06-04: 10 mg/h via RESPIRATORY_TRACT

## 2012-06-04 MED ORDER — MIRTAZAPINE 30 MG PO TABS
30.0000 mg | ORAL_TABLET | Freq: Every day | ORAL | Status: DC
  Administered 2012-06-04 – 2012-06-09 (×6): 30 mg via ORAL
  Filled 2012-06-04 (×7): qty 1

## 2012-06-04 MED ORDER — LORAZEPAM 0.5 MG PO TABS
0.5000 mg | ORAL_TABLET | Freq: Every day | ORAL | Status: DC
  Administered 2012-06-04 – 2012-06-05 (×2): 1 mg via ORAL
  Administered 2012-06-06 (×2): 0.5 mg via ORAL
  Administered 2012-06-07 – 2012-06-09 (×3): 1 mg via ORAL
  Filled 2012-06-04 (×2): qty 2
  Filled 2012-06-04: qty 1
  Filled 2012-06-04 (×2): qty 2
  Filled 2012-06-04: qty 1
  Filled 2012-06-04: qty 2

## 2012-06-04 MED ORDER — TRIMETHOPRIM 100 MG PO TABS
100.0000 mg | ORAL_TABLET | Freq: Every day | ORAL | Status: DC
  Administered 2012-06-05 – 2012-06-10 (×6): 100 mg via ORAL
  Filled 2012-06-04 (×6): qty 1

## 2012-06-04 MED ORDER — PANTOPRAZOLE SODIUM 40 MG PO TBEC
40.0000 mg | DELAYED_RELEASE_TABLET | Freq: Every day | ORAL | Status: DC
  Administered 2012-06-04 – 2012-06-10 (×7): 40 mg via ORAL
  Filled 2012-06-04 (×7): qty 1

## 2012-06-04 MED ORDER — BUDESONIDE-FORMOTEROL FUMARATE 80-4.5 MCG/ACT IN AERO
2.0000 | INHALATION_SPRAY | Freq: Two times a day (BID) | RESPIRATORY_TRACT | Status: DC
  Administered 2012-06-04 – 2012-06-10 (×11): 2 via RESPIRATORY_TRACT
  Filled 2012-06-04: qty 6.9

## 2012-06-04 MED ORDER — GABAPENTIN 100 MG PO CAPS
100.0000 mg | ORAL_CAPSULE | Freq: Every day | ORAL | Status: DC
  Administered 2012-06-04 – 2012-06-09 (×6): 100 mg via ORAL
  Filled 2012-06-04 (×7): qty 1

## 2012-06-04 MED ORDER — ALBUTEROL SULFATE (5 MG/ML) 0.5% IN NEBU
2.5000 mg | INHALATION_SOLUTION | RESPIRATORY_TRACT | Status: DC
  Administered 2012-06-04 – 2012-06-05 (×7): 2.5 mg via RESPIRATORY_TRACT
  Filled 2012-06-04 (×7): qty 0.5

## 2012-06-04 MED ORDER — FUROSEMIDE 10 MG/ML IJ SOLN
20.0000 mg | Freq: Two times a day (BID) | INTRAMUSCULAR | Status: DC
  Administered 2012-06-05: 20 mg via INTRAVENOUS
  Filled 2012-06-04 (×3): qty 2

## 2012-06-04 MED ORDER — SODIUM CHLORIDE 0.9 % IJ SOLN
3.0000 mL | Freq: Two times a day (BID) | INTRAMUSCULAR | Status: DC
  Administered 2012-06-06 – 2012-06-10 (×8): 3 mL via INTRAVENOUS

## 2012-06-04 NOTE — ED Provider Notes (Signed)
History     CSN: [id number]  Arrival date & time 06/04/12  6644   First MD Initiated Contact with Patient 06/04/12 [phone]      Chief Complaint  Patient presents with  . Wheezing  . Cough    (Consider location/radiation/quality/duration/timing/severity/associated sxs/prior treatment) Patient is a 78 y.o. female presenting with wheezing and cough. The history is provided by the patient.  Wheezing  Associated symptoms include cough and wheezing.  Cough Associated symptoms include wheezing.   patient here complaining of shortness of breath and wheezing x2 days. Has had wheezing and low-grade temperature at the nursing home. EMS was called and she was given albuterol and Atrovent and transported here. Denies any neck pain. No urinary symptoms. No chest pain or chest pressure. Does have a history of CHF  Past Medical History  Diagnosis Date  . Routine general medical examination at a health care facility   . Other B-complex deficiencies   . Unspecified hypothyroidism   . Diverticulitis of colon (without mention of hemorrhage)   . Congestive heart failure, unspecified   . Stricture and stenosis of esophagus   . Duodenal ulcer, unspecified as acute or chronic, without hemorrhage, perforation, or obstruction   . Atrial fibrillation   . Edema   . Dysphagia, unspecified   . Depressive type psychosis   . Sciatica   . Other constipation   . Unspecified essential hypertension   . Arthritis   . Gallstones   . PONV (postoperative nausea and vomiting)     Past Surgical History  Procedure Date  . Abdominal hysterectomy   . Cholecystectomy     Family History  Problem Relation Age of Onset  . Cancer Other     breast   . Heart disease Other     History  Substance Use Topics  . Smoking status: Never Smoker   . Smokeless tobacco: Former Neurosurgeon    Types: Snuff    Quit date: 01/13/1964  . Alcohol Use: No    OB History    Grav Para Term Preterm Abortions TAB SAB Ect Mult Living                   Review of Systems  Respiratory: Positive for cough and wheezing.   All other systems reviewed and are negative.    Allergies  Prednisone and Sertraline hcl  Home Medications   Current Outpatient Rx  Name  Route  Sig  Dispense  Refill  . ALBUTEROL SULFATE HFA 108 (90 BASE) MCG/ACT IN AERS   Inhalation   Inhale 2 puffs into the lungs every 4 (four) hours as needed. For break through wheeziing   18 g   3   . CLONIDINE HCL 0.1 MG PO TABS   Oral   Take 0.1 mg by mouth 2 (two) times daily.          Marland Kitchen DILTIAZEM HCL ER COATED BEADS 120 MG PO CP24   Oral   Take 120 mg by mouth daily.         Marland Kitchen DILTIAZEM HCL ER BEADS 120 MG PO CP24               . ENSURE COMPLETE PO LIQD   Oral   Take 237 mLs by mouth 2 (two) times daily between meals.   5688 mL   11   . FLUTICASONE-SALMETEROL 250-50 MCG/DOSE IN AEPB   Inhalation   Inhale 1 puff into the lungs 2 (two) times daily. In the AM and  at BEDTIME   60 each   3   . FUROSEMIDE 40 MG PO TABS   Oral   Take 40 mg by mouth daily.         Marland Kitchen GABAPENTIN 100 MG PO CAPS   Oral   Take 100 mg by mouth at bedtime.          . GUAIFENESIN-DM 100-10 MG/5ML PO SYRP   Oral   Take 5 mLs by mouth every 4 (four) hours as needed for cough.   118 mL      . LEVOTHYROXINE SODIUM 25 MCG PO TABS      TAKE 1 TABLET BY MOUTH ONCE A DAY   30 tablet   6   . LORAZEPAM 0.5 MG PO TABS   Oral   Take 1-2 tablets (0.5-1 mg total) by mouth 2 (two) times daily. Take 0.5 mg every morning and 1 mg at bedtime.   60 tablet   5   . MIRTAZAPINE 30 MG PO TABS   Oral   Take 30 mg by mouth at bedtime.         . OMEPRAZOLE 40 MG PO CPDR   Oral   Take 40 mg by mouth 2 (two) times daily.         . OXYCODONE-ACETAMINOPHEN 5-325 MG PO TABS   Oral   Take 1 tablet by mouth every 6 (six) hours as needed. Pain.         Marland Kitchen POTASSIUM CHLORIDE CRYS ER 20 MEQ PO TBCR   Oral   Take 20 mEq by mouth daily.         . SYMBICORT  80-4.5 MCG/ACT IN AERO               . TRAZODONE HCL 50 MG PO TABS   Oral   Take 25 mg by mouth at bedtime.         Marland Kitchen TRIMETHOPRIM 100 MG PO TABS   Oral   Take 100 mg by mouth daily.            BP 158/74  Pulse 106  Temp 98.8 F (37.1 C) (Oral)  Resp 22  SpO2 100%  Physical Exam  Nursing note and vitals reviewed. Constitutional: She is oriented to person, place, and time. She appears well-developed and well-nourished.  Non-toxic appearance. No distress.  HENT:  Head: Normocephalic and atraumatic.  Eyes: Conjunctivae normal, EOM and lids are normal. Pupils are equal, round, and reactive to light.  Neck: Normal range of motion. Neck supple. No tracheal deviation present. No mass present.  Cardiovascular: Normal rate, regular rhythm and normal heart sounds.  Exam reveals no gallop.   No murmur heard. Pulmonary/Chest: Effort normal. No stridor. No respiratory distress. She has decreased breath sounds. She has wheezes. She has rhonchi. She has no rales.  Abdominal: Soft. Normal appearance and bowel sounds are normal. She exhibits no distension. There is no tenderness. There is no rebound and no CVA tenderness.  Musculoskeletal: Normal range of motion. She exhibits no edema and no tenderness.  Neurological: She is alert and oriented to person, place, and time. She has normal strength. No cranial nerve deficit or sensory deficit. GCS eye subscore is 4. GCS verbal subscore is 5. GCS motor subscore is 6.  Skin: Skin is warm and dry. No abrasion and no rash noted.  Psychiatric: She has a normal mood and affect. Her speech is normal and behavior is normal.    ED Course  Procedures (including critical  care time)  Labs Reviewed - No data to display No results found.   No diagnosis found.    MDM   Date: 06/04/2012  Rate: 110  Rhythm: sinus tachycardia  QRS Axis: normal  Intervals: normal  ST/T Wave abnormalities: normal  Conduction Disutrbances:none  Narrative  Interpretation:   Old EKG Reviewed: none available  Patient given albuterol treatments here and no steroids due to her allergy to prednisone. Her BNP was slightly elevated and she'll be in Lasix for this. She'll be admitted to the hospital  CRITICAL CARE Performed by: Toy Baker   Total critical care time: 60  Critical care time was exclusive of separately billable procedures and treating other patients.  Critical care was necessary to treat or prevent imminent or life-threatening deterioration.  Critical care was time spent personally by me on the following activities: development of treatment plan with patient and/or surrogate as well as nursing, discussions with consultants, evaluation of patient's response to treatment, examination of patient, obtaining history from patient or surrogate, ordering and performing treatments and interventions, ordering and review of laboratory studies, ordering and review of radiographic studies, pulse oximetry and re-evaluation of patient's condition.           Toy Baker, MD 06/04/12 [phone]

## 2012-06-04 NOTE — Progress Notes (Signed)
Echocardiogram 2D Echocardiogram has been performed.  Zahmir Lalla 06/04/2012, 3:12 PM

## 2012-06-04 NOTE — Progress Notes (Signed)
Patient has pressure dressings to bilateral legs that family says are for weeping and are special dressings that the physician put on. Will wait for wound care nurse to evaluate. Erskin Burnet RN

## 2012-06-04 NOTE — ED Notes (Signed)
Per EMS- Patient is a resident of [facility]. Patient was having SOB and a cough. Patient was given Albuterol 5 mg and aduo neb with Atrovent. Patient continues to have expiratory wheezing. Patient is on O2 11/2L via [place] at the [facility]. Patient c/o SOB and frequent non productive cough.

## 2012-06-04 NOTE — ED Notes (Signed)
ZOX:[id number]<AV> Expected date:06/04/12<BR> Expected time: 6:57 AM<BR> Means of arrival:Ambulance<BR> Comments:<BR> Shortness of breath  78 yo F

## 2012-06-04 NOTE — H&P (Signed)
PCP:   [name], MD   Chief Complaint:  Shortness of breath  HPI: 78 year old female with a history of CHF, atrial fibrillation came to the hospital with worsening of shortness of breath for past 2 days. Patient takes Lasix at home. But over the past 2 days her breathing has worsened. She denies any cough with expectoration. Patient does take Symbicort at home. She denies chest pain, no nausea vomiting or diarrhea. Her BNP was found to be elevated to 1391. Patient has allergy to prednisone so she was not given steroids in the ED.  Allergies:   Allergies  Allergen Reactions  . Prednisone Other (See Comments)    Delirium   . Sertraline Hcl Other (See Comments)    Delirium       Past Medical History  Diagnosis Date  . Routine general medical examination at a health care facility   . Other B-complex deficiencies   . Unspecified hypothyroidism   . Diverticulitis of colon (without mention of hemorrhage)   . Congestive heart failure, unspecified   . Stricture and stenosis of esophagus   . Duodenal ulcer, unspecified as acute or chronic, without hemorrhage, perforation, or obstruction   . Atrial fibrillation   . Edema   . Dysphagia, unspecified   . Depressive type psychosis   . Sciatica   . Other constipation   . Unspecified essential hypertension   . Arthritis   . Gallstones   . PONV (postoperative nausea and vomiting)     Past Surgical History  Procedure Date  . Abdominal hysterectomy   . Cholecystectomy     Prior to Admission medications   Medication Sig Start Date End Date Taking? Authorizing Provider  albuterol (PROVENTIL HFA;VENTOLIN HFA) 108 (90 BASE) MCG/ACT inhaler Inhale 2 puffs into the lungs every 4 (four) hours as needed. For break through wheeziing 04/09/12  Yes Jacques Navy, MD  cloNIDine (CATAPRES) 0.1 MG tablet Take 0.1 mg by mouth 2 (two) times daily.    Yes Historical Provider, MD  diltiazem (TIAZAC) 120 MG 24 hr capsule Take 120 mg by mouth  daily.  03/13/12  Yes Historical Provider, MD  feeding supplement (ENSURE COMPLETE) LIQD Take 237 mLs by mouth 2 (two) times daily between meals. 05/27/12  Yes Jacques Navy, MD  furosemide (LASIX) 40 MG tablet Take 40 mg by mouth daily. 10/04/11  Yes Jacques Navy, MD  gabapentin (NEURONTIN) 100 MG capsule Take 100 mg by mouth at bedtime.    Yes Historical Provider, MD  levothyroxine (SYNTHROID, LEVOTHROID) 25 MCG tablet TAKE 1 TABLET BY MOUTH ONCE A DAY 01/22/12  Yes Jacques Navy, MD  LORazepam (ATIVAN) 0.5 MG tablet Take 0.5-1 mg by mouth at bedtime.   Yes Historical Provider, MD  mirtazapine (REMERON) 30 MG tablet Take 30 mg by mouth at bedtime. 10/07/11  Yes Jacques Navy, MD  omeprazole (PRILOSEC) 40 MG capsule Take 40 mg by mouth 2 (two) times daily.   Yes Historical Provider, MD  oxyCODONE-acetaminophen (PERCOCET) 5-325 MG per tablet Take 1 tablet by mouth every 6 (six) hours as needed. Pain. 09/05/11 09/04/12 Yes Jacques Navy, MD  potassium chloride SA (K-DUR,KLOR-CON) 20 MEQ tablet Take 20 mEq by mouth daily. 10/04/11  Yes Jacques Navy, MD  SYMBICORT 80-4.5 MCG/ACT inhaler Inhale 2 puffs into the lungs 2 (two) times daily.  05/22/12  Yes Historical Provider, MD  traZODone (DESYREL) 50 MG tablet Take 25 mg by mouth at bedtime. 10/07/11  Yes Rosalyn Gess Norins,  MD  trimethoprim (TRIMPEX) 100 MG tablet Take 100 mg by mouth daily.  04/25/11  Yes Jacques Navy, MD  guaiFENesin-dextromethorphan (ROBITUSSIN DM) 100-10 MG/5ML syrup Take 5 mLs by mouth every 4 (four) hours as needed for cough. 04/01/12   Jacques Navy, MD    Social History:  reports that she has never smoked. She quit smokeless tobacco use about 48 years ago. Her smokeless tobacco use included Snuff. She reports that she does not drink alcohol or use illicit drugs.  Family History  Problem Relation Age of Onset  . Cancer Other     breast   . Heart disease Other     Review of Systems:  HEENT: Denies headache,  blurred vision, runny nose, sore throat,  Neck: Denies thyroid problems,lymphadenopathy Chest : See history of present illness Heart : Denies Chest pain,  no history of coronary arterey disease GI: Denies  nausea, vomiting, diarrhea, constipation GU: Denies dysuria, urgency, frequency of urination, hematuria Neuro: Denies stroke, seizures, syncope Psych: Denies depression, anxiety, hallucinations   Physical Exam: Blood pressure 168/47, pulse 108, temperature 98.8 F (37.1 C), temperature source Oral, resp. rate 24, SpO2 94.00%. Constitutional:   Patient is a well-developed and well-nourished  female no acute distress and cooperative with exam. Head: Normocephalic and atraumatic Mouth: Mucus membranes moist Eyes: PERRL, EOMI, conjunctivae normal Neck: Supple, No Thyromegaly Cardiovascular: RRR, S1 normal, S2 normal Pulmonary/Chest: Bibasilar crackles, scattered rhonchi Abdominal: Soft. Non-tender, non-distended, bowel sounds are normal, no masses, organomegaly, or guarding present.  Neurological: A&O x3, Strenght is normal and symmetric bilaterally, cranial nerve II-XII are grossly intact, no focal motor deficit, sensory intact to light touch bilaterally.  Extremities : Patient's legs covered in dressing   Labs on Admission:  Results for orders placed during the hospital encounter of 06/04/12 (from the past 48 hour(s))  URINALYSIS, ROUTINE W REFLEX MICROSCOPIC     Status: Abnormal   Collection Time   06/04/12  8:30 AM      Component Value Range Comment   Color, Urine YELLOW  YELLOW    APPearance CLOUDY (*) CLEAR    Specific Gravity, Urine 1.016  1.005 - 1.030    pH 7.5  5.0 - 8.0    Glucose, UA NEGATIVE  NEGATIVE mg/dL    Hgb urine dipstick NEGATIVE  NEGATIVE    Bilirubin Urine NEGATIVE  NEGATIVE    Ketones, ur NEGATIVE  NEGATIVE mg/dL    Protein, ur NEGATIVE  NEGATIVE mg/dL    Urobilinogen, UA 0.2  0.0 - 1.0 mg/dL    Nitrite NEGATIVE  NEGATIVE    Leukocytes, UA TRACE (*)  NEGATIVE   URINE MICROSCOPIC-ADD ON     Status: Abnormal   Collection Time   06/04/12  8:30 AM      Component Value Range Comment   Squamous Epithelial / LPF FEW (*) RARE    WBC, UA 0-2  <3 WBC/hpf    RBC / HPF 0-2  <3 RBC/hpf    Bacteria, UA FEW (*) RARE   BASIC METABOLIC PANEL     Status: Abnormal   Collection Time   06/04/12  8:40 AM      Component Value Range Comment   Sodium 138  135 - 145 mEq/L    Potassium 3.6  3.5 - 5.1 mEq/L    Chloride 100  96 - 112 mEq/L    CO2 27  19 - 32 mEq/L    Glucose, Bld 170 (*) 70 - 99 mg/dL  BUN 20  6 - 23 mg/dL    Creatinine, Ser 1.61  0.50 - 1.10 mg/dL    Calcium 8.6  8.4 - 09.6 mg/dL    GFR calc non Af Amer 42 (*) >90 mL/min    GFR calc Af Amer 49 (*) >90 mL/min   LACTIC ACID, PLASMA     Status: Normal   Collection Time   06/04/12  8:40 AM      Component Value Range Comment   Lactic Acid, Venous 2.0  0.5 - 2.2 mmol/L   PRO B NATRIURETIC PEPTIDE     Status: Abnormal   Collection Time   06/04/12  8:40 AM      Component Value Range Comment   Pro B Natriuretic peptide (BNP) 1391.0 (*) 0 - 450 pg/mL   POCT I-STAT TROPONIN I     Status: Normal   Collection Time   06/04/12  8:59 AM      Component Value Range Comment   Troponin i, poc 0.00  0.00 - 0.08 ng/mL    Comment 3            CBC     Status: Abnormal   Collection Time   06/04/12  9:30 AM      Component Value Range Comment   WBC 5.0  4.0 - 10.5 K/uL    RBC 3.23 (*) 3.87 - 5.11 MIL/uL    Hemoglobin 10.1 (*) 12.0 - 15.0 g/dL    HCT 04.5 (*) 40.9 - 46.0 %    MCV 95.7  78.0 - 100.0 fL    MCH 31.3  26.0 - 34.0 pg    MCHC 32.7  30.0 - 36.0 g/dL    RDW 81.1  91.4 - 78.2 %    Platelets 188  150 - 400 K/uL   DIFFERENTIAL     Status: Normal   Collection Time   06/04/12  9:30 AM      Component Value Range Comment   Neutrophils Relative 75  43 - 77 %    Neutro Abs 3.8  1.7 - 7.7 K/uL    Lymphocytes Relative 14  12 - 46 %    Lymphs Abs 0.7  0.7 - 4.0 K/uL    Monocytes Relative 11  3 - 12  %    Monocytes Absolute 0.5  0.1 - 1.0 K/uL    Eosinophils Relative 0  0 - 5 %    Eosinophils Absolute 0.0  0.0 - 0.7 K/uL    Basophils Relative 0  0 - 1 %    Basophils Absolute 0.0  0.0 - 0.1 K/uL     Radiological Exams on Admission: Dg Chest 2 View  06/04/2012  *RADIOLOGY REPORT*  Clinical Data: Cough and congestion.  History of congestive heart failure.  CHEST - 2 VIEW  Comparison: 04/03/2012  Findings: Mild blunting of the right costophrenic angle appears to be chronic.  Thoracic spondylosis is present.  Cardiothoracic index 52%, within normal limits for the AP projection.  Mild atherosclerotic calcification of the aortic arch.  IMPRESSION:  1.  Mild chronic blunting of the right costophrenic angle potentially reflecting a small pleural effusion. 2.  Thoracic spondylosis. 3.  Mild atherosclerotic calcification of the aortic arch.   Original Report Authenticated By: Gaylyn Rong, M.D.     Assessment/Plan  CHF exacerbation Patient's last echocardiogram as of October 2000 and showed EF of 65-70% with grade 1 diastolic dysfunction. Will obtain another 2-D echo in the hospital. Patient will be started on  Lasix 20 mg IV every 12 hours.  Acute bronchitis Patient does have mild bronchitis. We'll start her on Levaquin. As patient has allergy to prednisone so unfortunately cannot give steroids at this time.  Atrial fibrillation Will monitor the patient intimately and continue her Cardizem 120 mg by mouth daily Patient is not on anticoagulation with Coumadin  Hypertension Will continue patient's home medications including Catapres, Cardizem.  Leg ulcers As per patient's daughter she was seen by Dr. Arthur Holms  Recently, where she had leg for weeping ulcers.  DVT prophylaxis Lovenox  Time Spent on Admission: 70 min  Mckynleigh Mussell S [facility] Pager: [phone] 06/04/2012, 12:40 PM

## 2012-06-04 NOTE — Progress Notes (Signed)
ANTIBIOTIC CONSULT NOTE - INITIAL  Pharmacy Consult for Levaquin Indication: Pulmonary infection  Allergies  Allergen Reactions  . Prednisone Other (See Comments)    Delirium   . Sertraline Hcl Other (See Comments)    Delirium     Patient Measurements:   Wt 48.3kg on 04/06/12  Vital Signs: Temp: 98.8 F (37.1 C) (12/05 0742) Temp src: Oral (12/05 0742) BP: 182/53 mmHg (12/05 1200) Pulse Rate: 108  (12/05 1200) Intake/Output from previous day:   Intake/Output from this shift:    Labs:  Basename 06/04/12 0930 06/04/12 0840  WBC 5.0 --  HGB 10.1* --  PLT 188 --  LABCREA -- --  CREATININE -- 1.10   CrCl approximately 24 mL/min  The CrCl is unknown because both a height and weight (above a minimum accepted value) are required for this calculation.    Microbiology: No results found for this or any previous visit (from the past 720 hour(s)).  Medical History: Past Medical History  Diagnosis Date  . Routine general medical examination at a health care facility   . Other B-complex deficiencies   . Unspecified hypothyroidism   . Diverticulitis of colon (without mention of hemorrhage)   . Congestive heart failure, unspecified   . Stricture and stenosis of esophagus   . Duodenal ulcer, unspecified as acute or chronic, without hemorrhage, perforation, or obstruction   . Atrial fibrillation   . Edema   . Dysphagia, unspecified   . Depressive type psychosis   . Sciatica   . Other constipation   . Unspecified essential hypertension   . Arthritis   . Gallstones   . PONV (postoperative nausea and vomiting)     Medications:  Scheduled:    . ipratropium  0.5 mg Nebulization Q4H   And  . albuterol  2.5 mg Nebulization Q4H  . albuterol      . [COMPLETED] albuterol  10 mg/hr Nebulization Once  . budesonide-formoterol  2 puff Inhalation BID  . cloNIDine  0.1 mg Oral BID  . diltiazem  120 mg Oral Daily  . feeding supplement  237 mL Oral BID BM  . furosemide  20 mg  Intravenous Q12H  . [COMPLETED] furosemide  40 mg Intravenous Once  . gabapentin  100 mg Oral QHS  . levothyroxine  25 mcg Oral QAC breakfast  . LORazepam  0.5-1 mg Oral QHS  . mirtazapine  30 mg Oral QHS  . pantoprazole  40 mg Oral Daily  . potassium chloride SA  20 mEq Oral Daily  . sodium chloride  3 mL Intravenous Q12H  . traZODone  25 mg Oral QHS  . trimethoprim  100 mg Oral Daily   Infusions:    . sodium chloride    . [DISCONTINUED] sodium chloride 125 mL/hr at 06/04/12 0855   PRN: sodium chloride, guaiFENesin-dextromethorphan, oxyCODONE-acetaminophen, sodium chloride  Assessment:  78 y/o F presented to ED with wheezing and cough, to begin empiric Levaquin for pulmonary infection.  Despite SCr wnl, estimated CrCl is low (24 mL/min) secondary to age and weight.  Blood and urine cultures pending  Goal of Therapy:   Eradication of infection  Adjust Levaquin dosage for renal function  Plan:  1. Levaquin 500 mg IV x 1, then 250 mg IV q24h. 2. Follow SCr, cultures, hospital course.  Elie Goody, PharmD, BCPS Pager: [phone] 06/04/2012  1:21 PM

## 2012-06-05 LAB — URINE CULTURE

## 2012-06-05 LAB — COMPREHENSIVE METABOLIC PANEL
ALT: 8 U/L (ref 0–35)
AST: 21 U/L (ref 0–37)
Albumin: 2.5 g/dL — ABNORMAL LOW (ref 3.5–5.2)
CO2: 27 mEq/L (ref 19–32)
Calcium: 8.1 mg/dL — ABNORMAL LOW (ref 8.4–10.5)
GFR calc non Af Amer: 38 mL/min — ABNORMAL LOW (ref >90)
Sodium: 133 mEq/L — ABNORMAL LOW (ref 135–145)
Total Protein: 5.1 g/dL — ABNORMAL LOW (ref 6.0–8.3)

## 2012-06-05 LAB — CBC
HCT: 27.7 % — ABNORMAL LOW (ref 36.0–46.0)
Hemoglobin: 9 g/dL — ABNORMAL LOW (ref 12.0–15.0)
MCH: 31.3 pg (ref 26.0–34.0)
MCHC: 32.5 g/dL (ref 30.0–36.0)
MCV: 96.2 fL (ref 78.0–100.0)

## 2012-06-05 MED ORDER — IPRATROPIUM BROMIDE 0.02 % IN SOLN
0.5000 mg | Freq: Three times a day (TID) | RESPIRATORY_TRACT | Status: DC
  Administered 2012-06-06 – 2012-06-10 (×14): 0.5 mg via RESPIRATORY_TRACT
  Filled 2012-06-05 (×15): qty 2.5

## 2012-06-05 MED ORDER — ALBUTEROL SULFATE (5 MG/ML) 0.5% IN NEBU
2.5000 mg | INHALATION_SOLUTION | Freq: Three times a day (TID) | RESPIRATORY_TRACT | Status: DC
  Administered 2012-06-06 – 2012-06-10 (×14): 2.5 mg via RESPIRATORY_TRACT
  Filled 2012-06-05 (×15): qty 0.5

## 2012-06-05 MED ORDER — IPRATROPIUM BROMIDE 0.02 % IN SOLN: 0.5000 mg | RESPIRATORY_TRACT | Status: DC

## 2012-06-05 MED ORDER — ALBUTEROL SULFATE (5 MG/ML) 0.5% IN NEBU: 2.5000 mg | INHALATION_SOLUTION | Freq: Three times a day (TID) | RESPIRATORY_TRACT | Status: DC

## 2012-06-05 MED ORDER — DILTIAZEM HCL ER COATED BEADS 120 MG PO CP24
120.0000 mg | ORAL_CAPSULE | Freq: Every day | ORAL | Status: DC
  Administered 2012-06-05 – 2012-06-10 (×6): 120 mg via ORAL
  Filled 2012-06-05 (×6): qty 1

## 2012-06-05 MED ORDER — FUROSEMIDE 40 MG PO TABS
60.0000 mg | ORAL_TABLET | Freq: Every day | ORAL | Status: DC
  Administered 2012-06-05: 60 mg via ORAL
  Filled 2012-06-05 (×2): qty 1

## 2012-06-05 NOTE — Progress Notes (Signed)
CRITICAL VALUE ALERT  Critical value received:  Blood cultures- gram positive cocci in clusters Date of notification:  06/05/12  Time of notification:  1135  Critical value read back:yes  Nurse who received alert:  Marisa Sprinkles RN  MD notified (1st page):  Dr Kevan Ny  Time of first page:  1220  MD notified (2nd page):  Time of second page:  Responding MD:  Dr Kevan Ny  Time MD responded:  1220

## 2012-06-05 NOTE — Consult Note (Signed)
WOC consult Note Reason for Consult: eval. LE wounds and Stage II pressure ulcer of the sacrum Wound type: LE wound of the LLE, just medial of the pretibal area, appears to be trauma, skin flap present but not able to be re-aproximated, pt poor historian on this wound, could as well have been blister that has ruptured. With the present of Unna's boots-compression therapy not clear if she was edematous at the time they were placed,and pt unclear as well. Stage II pressure ulcer: right upper buttock: just right of sacrum  Pressure Ulcer POA: Yes  Measurement:  Wound LLE: 1.5cm x 1.5cm x0.2cm Buttock: 0.5cm 0.5cm x 0.2cm  Wound bed: Left leg: pink, moist with darkness at the distal edge Buttock: pink and moist  Drainage (amount, consistency, odor)  Moderate serous drainage from the LLE, with no odor No drainage from the buttock wound  Periwound: intact at both sites  Dressing procedure/placement/frequency: Will continue Unna's boots, no topical care needed for wound, zinc should continue to support wound healing.  Soft silicone foam dressings implemented per the bedside nurse to be continued to protect and insulate wound for wound healing. Encourage pt. To turn side to side, to relief pressure from the buttocks. Will page orthopedic tech to replace Unna's boot now and then maintain weekly.   Re consult if needed, will not follow at this time. Thanks  Candy Ziegler [employer], CWOCN [phone])

## 2012-06-05 NOTE — Progress Notes (Signed)
   CARE MANAGEMENT NOTE 06/05/2012  Patient:  [name] [name], [name] [name]   Account Number:  [id number]  Date Initiated:  06/05/2012  Documentation initiated by:  Jiles Crocker  Subjective/Objective Assessment:   ADMITTED WITH CHF     Action/Plan:   PCP: [name], MD  PATIENT RESIDES IN AN ASSISTED LIVING FACILITY; SOC WORKER REFERRAL PLACED   Anticipated DC Date:  06/12/2012   Anticipated DC Plan:  ASSISTED LIVING / REST HOME  In-house referral  Clinical Social Worker      DC Planning Services  CM consult      Status of service:  In process, will continue to follow Medicare Important Message given?  NA - LOS <3 / Initial given by admissions (If response is "NO", the following Medicare IM given date fields will be blank)  Per UR Regulation:  Reviewed for med. necessity/level of care/duration of stay  Comments:  06/05/2012- B Aldeen Riga RN,BSN,MHA

## 2012-06-05 NOTE — Progress Notes (Addendum)
Subjective: Mrs. Redmond is feeling a lot better today.  Cough is still mildly congested.  She was extremely short of breath yesterday.  No chest pain today.  Objective: Weight change:   Intake/Output Summary (Last 24 hours) at 06/05/12 0727 Last data filed at 06/05/12 0601  Gross per 24 hour  Intake      0 ml  Output    700 ml  Net   -700 ml   Filed Vitals:   06/05/12 0015 06/05/12 0409 06/05/12 0500 06/05/12 0532  BP:    106/46  Pulse:    84  Temp:    97.6 F (36.4 C)  TempSrc:    Oral  Resp:    20  Height:      Weight:   53.2 kg (117 lb 4.6 oz)   SpO2: 96% 99%  97%   General Appearance: Alert, cooperative, no distress, appears stated age Head: Normocephalic, without obvious abnormality, atraumatic Neck: Supple, symmetrical Lungs: Clear to auscultation bilaterally, respirations unlabored Heart: Irregular rhythm, S1 and S2 normal, no murmur, rub or gallop Abdomen: Soft, non-tender, bowel sounds active all four quadrants, no masses, no organomegaly Extremities: No edema.  Legs are wrapped below the knees Pulses: 2+ and symmetric all extremities Skin: Skin color, texture, turgor normal, no rashes or lesions Neuro: CNII-XII intact. Normal strength, sensation and reflexes throughout   Lab Results:  Basename 06/05/12 0430 06/04/12 0840  NA 133* 138  K 4.0 3.6  CL 99 100  CO2 27 27  GLUCOSE 95 170*  BUN 20 20  CREATININE 1.20* 1.10  CALCIUM 8.1* 8.6  MG -- --  PHOS -- --    Basename 06/05/12 0430  AST 21  ALT 8  ALKPHOS 71  BILITOT 0.1*  PROT 5.1*  ALBUMIN 2.5*   No results found for this basename: LIPASE:2,AMYLASE:2 in the last 72 hours  Basename 06/05/12 0430 06/04/12 0930  WBC 2.5* 5.0  NEUTROABS -- 3.8  HGB 9.0* 10.1*  HCT 27.7* 30.9*  MCV 96.2 95.7  PLT 162 188   No results found for this basename: CKTOTAL:3,CKMB:3,CKMBINDEX:3,TROPONINI:3 in the last 72 hours No components found with this basename: POCBNP:3 No results found for this basename:  DDIMER:2 in the last 72 hours No results found for this basename: HGBA1C:2 in the last 72 hours No results found for this basename: CHOL:2,HDL:2,LDLCALC:2,TRIG:2,CHOLHDL:2,LDLDIRECT:2 in the last 72 hours No results found for this basename: TSH,T4TOTAL,FREET3,T3FREE,THYROIDAB in the last 72 hours No results found for this basename: VITAMINB12:2,FOLATE:2,FERRITIN:2,TIBC:2,IRON:2,RETICCTPCT:2 in the last 72 hours  Studies/Results: Dg Chest 2 View  06/04/2012  *RADIOLOGY REPORT*  Clinical Data: Cough and congestion.  History of congestive heart failure.  CHEST - 2 VIEW  Comparison: 04/03/2012  Findings: Mild blunting of the right costophrenic angle appears to be chronic.  Thoracic spondylosis is present.  Cardiothoracic index 52%, within normal limits for the AP projection.  Mild atherosclerotic calcification of the aortic arch.  IMPRESSION:  1.  Mild chronic blunting of the right costophrenic angle potentially reflecting a small pleural effusion. 2.  Thoracic spondylosis. 3.  Mild atherosclerotic calcification of the aortic arch.   Original Report Authenticated By: Gaylyn Rong, M.D.    Medications: Scheduled Meds:   . ipratropium  0.5 mg Nebulization Q4H   And  . albuterol  2.5 mg Nebulization Q4H  . [EXPIRED] albuterol      . [COMPLETED] albuterol  10 mg/hr Nebulization Once  . budesonide-formoterol  2 puff Inhalation BID  . cloNIDine  0.1 mg Oral BID  .  diltiazem  120 mg Oral Daily  . enoxaparin (LOVENOX) injection  30 mg Subcutaneous Q24H  . feeding supplement  237 mL Oral BID BM  . furosemide  20 mg Intravenous Q12H  . [COMPLETED] furosemide  40 mg Intravenous Once  . gabapentin  100 mg Oral QHS  . levofloxacin (LEVAQUIN) IV  250 mg Intravenous Q24H  . [COMPLETED] levofloxacin (LEVAQUIN) IV  500 mg Intravenous Once  . levothyroxine  25 mcg Oral QAC breakfast  . LORazepam  0.5-1 mg Oral QHS  . mirtazapine  30 mg Oral QHS  . pantoprazole  40 mg Oral Daily  . potassium chloride  SA  20 mEq Oral Daily  . sodium chloride  3 mL Intravenous Q12H  . traZODone  25 mg Oral QHS  . trimethoprim  100 mg Oral Daily  . [DISCONTINUED] diltiazem  120 mg Oral Daily   Continuous Infusions:   . [DISCONTINUED] sodium chloride 125 mL/hr at 06/04/12 0855   PRN Meds:.sodium chloride, guaiFENesin-dextromethorphan, oxyCODONE-acetaminophen, sodium chloride  Assessment/Plan:  Assessment/Plan  CHF exacerbation - symptomatically improved today.  No obvious rales on exam.  Will switch back to 40 milligrams of Lasix orally daily Patient's last echocardiogram as of October 2000 and showed EF of 65-70% with grade 1 diastolic dysfunction.2-D echo pending Patient will be transitioned to oral Lasix, 60 milligrams daily and followup on 2-D echo Acute bronchitis  Continue Levaquin. As patient has allergy to prednisone so unfortunately cannot give steroids at this time.  Symptomatically better.  Continue nebs Atrial fibrillation  Continue to monitor the patient and continue Cardizem 120 mg by mouth daily  Patient does not take anticoagulation therapy secondary to risk for falls, et Karie Soda. Hypertension  On Catapres, Cardizem.  Leg ulcers  As per patient's daughter she was seen by Dr. Debby Bud Recently, where she had leg for weeping ulcers.  Will have wound care consult DVT prophylaxis  Lovenox Disposition:  Try to ambulate today with PTOT and hopefully back to [facility] Saturday or Sunday    LOS: 1 day   Kelaiah Escalona NEVILL 06/05/2012, 7:27 AM

## 2012-06-06 LAB — CULTURE, BLOOD (ROUTINE X 2)

## 2012-06-06 LAB — BASIC METABOLIC PANEL
BUN: 27 mg/dL — ABNORMAL HIGH (ref 6–23)
CO2: 29 mEq/L (ref 19–32)
Chloride: 96 mEq/L (ref 96–112)
Creatinine, Ser: 1.35 mg/dL — ABNORMAL HIGH (ref 0.50–1.10)
Glucose, Bld: 96 mg/dL (ref 70–99)
Potassium: 4.1 mEq/L (ref 3.5–5.1)

## 2012-06-06 LAB — CBC
HCT: 28.3 % — ABNORMAL LOW (ref 36.0–46.0)
Hemoglobin: 9.1 g/dL — ABNORMAL LOW (ref 12.0–15.0)
MCHC: 32.2 g/dL (ref 30.0–36.0)
MCV: 95 fL (ref 78.0–100.0)
RDW: 13.8 % (ref 11.5–15.5)

## 2012-06-06 MED ORDER — ALBUTEROL SULFATE (5 MG/ML) 0.5% IN NEBU: 2.5000 mg | INHALATION_SOLUTION | Freq: Three times a day (TID) | RESPIRATORY_TRACT | Status: DC

## 2012-06-06 MED ORDER — FUROSEMIDE 40 MG PO TABS
40.0000 mg | ORAL_TABLET | Freq: Every day | ORAL | Status: DC
  Administered 2012-06-06 – 2012-06-07 (×2): 40 mg via ORAL
  Filled 2012-06-06 (×3): qty 1

## 2012-06-06 NOTE — Progress Notes (Signed)
Subjective: Pt c/o wheezing comfortable  Objective: Vital signs in last 24 hours: Temp:  [98.4 F (36.9 C)-98.6 F (37 C)] 98.4 F (36.9 C) (12/07 0556) Pulse Rate:  [75-88] 75  (12/07 0556) Resp:  [18-20] 18  (12/07 0556) BP: (115-158)/(34-52) 115/39 mmHg (12/07 0556) SpO2:  [95 %-100 %] 98 % (12/07 0556) Weight:  [54.4 kg (119 lb 14.9 oz)] 54.4 kg (119 lb 14.9 oz) (12/07 0500) Weight change: -0.3 kg (-10.6 oz) Last BM Date:  (unknown)  Intake/Output from previous day: 12/06 0701 - 12/07 0700 In: 760 [P.O.:600; I.V.:110; IV Piggyback:50] Out: 1000 [Urine:1000] Intake/Output this shift:    General appearance: alert Resp: rhonchi bibasilar and wheezes bibasilar Cardio: regular rate and rhythm Extremities: una boot to both leg, edema better  Lab Results:  Basename 06/06/12 0525 06/05/12 0430  WBC 3.0* 2.5*  HGB 9.1* 9.0*  HCT 28.3* 27.7*  PLT 168 162   BMET  Basename 06/06/12 0525 06/05/12 0430  NA 133* 133*  K 4.1 4.0  CL 96 99  CO2 29 27  GLUCOSE 96 95  BUN 27* 20  CREATININE 1.35* 1.20*  CALCIUM 8.3* 8.1*    Studies/Results: Dg Chest 2 View  06/04/2012  *RADIOLOGY REPORT*  Clinical Data: Cough and congestion.  History of congestive heart failure.  CHEST - 2 VIEW  Comparison: 04/03/2012  Findings: Mild blunting of the right costophrenic angle appears to be chronic.  Thoracic spondylosis is present.  Cardiothoracic index 52%, within normal limits for the AP projection.  Mild atherosclerotic calcification of the aortic arch.  IMPRESSION:  1.  Mild chronic blunting of the right costophrenic angle potentially reflecting a small pleural effusion. 2.  Thoracic spondylosis. 3.  Mild atherosclerotic calcification of the aortic arch.   Original Report Authenticated By: Gaylyn Rong, M.D.     Medications: I have reviewed the patient's current medications.  Assessment/Plan: Assessment/Plan  CHF exacerbation - symptomatically improved today. No obvious rales on  exam. Lasix back to 40 mg- cr 1.35 Echo ok  Acute bronchitis  Continue Levaquin. As patient has allergy to prednisone so unfortunately cannot give steroids at this time. Symptomatically better. Continue nebs  Atrial fibrillation  Continue to monitor the patient and continue Cardizem 120 mg by mouth daily  Patient does not take anticoagulation therapy secondary to risk for falls, et Karie Soda.  Hypertension  On Catapres, Cardizem.  Leg ulcers  As per patient's daughter she was seen by Dr. Debby Bud Recently, where she had leg for weeping ulcers.una boot tx DVT prophylaxis  Lovenox  Disposition: back to [facility]  Sunday   LOS: 2 days   Charidy Cappelletti 06/06/2012, 8:00 AM

## 2012-06-07 MED ORDER — LEVOFLOXACIN 250 MG PO TABS
250.0000 mg | ORAL_TABLET | Freq: Every day | ORAL | Status: DC
  Administered 2012-06-07 – 2012-06-10 (×4): 250 mg via ORAL
  Filled 2012-06-07 (×4): qty 1

## 2012-06-07 NOTE — Progress Notes (Signed)
Subjective: Pt not out of bed-  ?need PT eval   Still some wheezing and cough  Objective: Vital signs in last 24 hours: Temp:  [97.2 F (36.2 C)-98.5 F (36.9 C)] 98.5 F (36.9 C) (12/08 0500) Pulse Rate:  [75-77] 77  (12/08 0500) Resp:  [16-18] 18  (12/08 0500) BP: (112-153)/(40-49) 112/40 mmHg (12/08 0500) SpO2:  [96 %-100 %] 100 % (12/08 0500) Weight:  [54.4 kg (119 lb 14.9 oz)] 54.4 kg (119 lb 14.9 oz) (12/08 0500) Weight change: 0 kg (0 lb) Last BM Date: 06/05/12  Intake/Output from previous day: 12/07 0701 - 12/08 0700 In: 1080 [P.O.:1080] Out: 1400 [Urine:1400] Intake/Output this shift:    General appearance: alert Resp: bilat wheeze and rhonchi Cardio: regular rate and rhythm GI: soft, non-tender; bowel sounds normal; no masses,  no organomegaly  Lab Results:  Basename 06/06/12 0525 06/05/12 0430  WBC 3.0* 2.5*  HGB 9.1* 9.0*  HCT 28.3* 27.7*  PLT 168 162   BMET  Basename 06/06/12 0525 06/05/12 0430  NA 133* 133*  K 4.1 4.0  CL 96 99  CO2 29 27  GLUCOSE 96 95  BUN 27* 20  CREATININE 1.35* 1.20*  CALCIUM 8.3* 8.1*    Studies/Results: No results found.  Medications: I have reviewed the patient's current medications.  Assessment/Plan: Assessment/Plan  CHF exacerbation - symptomatically improved today. No obvious rales on exam. Lasix back to 40 mg- cr 1.35  Echo ok  Acute bronchitis  Continue Levaquin- change to PO. As patient has allergy to prednisone so unfortunately cannot give steroids at this time. Symptomatically better. Continue nebs  x1 bottle GPC- ? Contamination. Atrial fibrillation  Continue to monitor the patient and continue Cardizem 120 mg by mouth daily  Patient does not take anticoagulation therapy secondary to risk for falls, et Karie Soda.  Hypertension  On Catapres, Cardizem.  Leg ulcers  Continue .una boot tx  DVT prophylaxis  Lovenox  Disposition: PT counsut; may need Short term SNF- not out of bed- assistance needed- hold  off AL d/c today    LOS: 3 days   Amrit Erck 06/07/2012, 8:19 AM

## 2012-06-07 NOTE — Progress Notes (Addendum)
Clinical Social Work Department CLINICAL SOCIAL WORK PLACEMENT NOTE 06/07/2012  Patient:  [name] [name], [name] [name]  Account Number:  [id number] Admit date:  06/04/2012  Clinical Social Worker:  Doroteo Glassman  Date/time:  06/07/2012 03:20 PM  Clinical Social Work is seeking post-discharge placement for this patient at the following level of care:   SKILLED NURSING   (*CSW will update this form in Epic as items are completed)   Will give with bed offers  Patient/family provided with [facility] Department of Clinical Social Work's list of facilities offering this level of care within the geographic area requested by the patient (or if unable, by the patient's family).  06/07/12  Patient/family informed of their freedom to choose among providers that offer the needed level of care, that participate in Medicare, Medicaid or managed care program needed by the patient, have an available bed and are willing to accept the patient.  06/07/12  Patient/family informed of [facility]' ownership interest in [facility], as well as of the fact that they are under no obligation to receive care at this facility.  PASARR submitted to EDS on  PASARR number received from EDS on   FL2 transmitted to all facilities in geographic area requested by pt/family on  06/07/2012 FL2 transmitted to all facilities within larger geographic area on   Patient informed that his/her managed care company has contracts with or will negotiate with  certain facilities, including the following:    CSW to continue to follow.  Providence Crosby, LCSWA Clinical Social Work [phone]    Patient/family informed of bed offers received: 06/08/2012  Patient chooses bed at  Physician recommends and patient chooses bed at    Patient to be transferred to  on   Patient to be transferred to facility by   The following physician request were entered in Epic:   Additional Comments:

## 2012-06-07 NOTE — Progress Notes (Signed)
Clinical Social Work Department BRIEF PSYCHOSOCIAL ASSESSMENT 06/07/2012  Patient:  [name] [name], [name] [name]     Account Number:  [id number]     Admit date:  06/04/2012  Clinical Social Worker:  Doroteo Glassman  Date/Time:  06/07/2012 03:13 PM  Referred by:  Physician  Date Referred:  06/07/2012 Referred for  SNF Placement   Other Referral:   Interview type:  Other - See comment Other interview type:   Daughter, via phone    PSYCHOSOCIAL DATA Living Status:  FACILITY Admitted from facility:  [facility] Level of care:   Primary support name:  Debbrah Alar Primary support relationship to patient:  CHILD, ADULT Degree of support available:   adequate    CURRENT CONCERNS Current Concerns  Post-Acute Placement   Other Concerns:    SOCIAL WORK ASSESSMENT / PLAN Met with Pt to discuss d/c plans.    Pt asked that CSW contact her daughter to discuss d/c plans.    Spoke with Pt's daughter.  Pt's daughter gave CSW permission to send her mom's information to [employer].  Pt's daughter unsure of appropriate d/c plan, as she knows that Pt would rather go back to [facility].    CSW to send Pt's information to [facility] and provide the bed offers to Pt's daughter.  Pt's daughter will then review and make a decision.    CSW thanked Mrs. Newnam for her time.   Assessment/plan status:  Psychosocial Support/Ongoing Assessment of Needs Other assessment/ plan:   Information/referral to community resources:   Will give with bed offers.    PATIENT'S/FAMILY'S RESPONSE TO PLAN OF CARE: Mrs. Newnam thanked CSW for time and assistance.   CSW to continue to follow.  Providence Crosby, LCSWA Clinical Social Work [phone]

## 2012-06-07 NOTE — Progress Notes (Addendum)
Patient got up and out of bed with one person assist. Patient sat up in chair for 2 hours with no complaints of discomfort.  Patient expectorated phelgm several times throughout the day. Rn will continue to monitor

## 2012-06-07 NOTE — Progress Notes (Signed)
Attempted to assess Pt.  Pt asked that CSW contact her daughter re: d/c plans.  LM for Pt's daughter.  CSW to continue to follow.  Providence Crosby, LCSWA Clinical Social Work [phone]

## 2012-06-07 NOTE — Evaluation (Signed)
Physical Therapy Evaluation Patient Details Name: [name] [name] MRN: [id number] DOB: September 12, 1916 Today's Date: 06/07/2012 Time: 1150-1209 PT Time Calculation (min): 19 min  PT Assessment / Plan / Recommendation Clinical Impression  Pt presents with afib, CHF and hypoxemia.  Noted that pt has wound on L buttock and has unna boots on BLE due to weeping wounds.  Tolerated ambulation in hallway with +2 assist for safety (able to do at min assist), however noted that pt has increased coughing and SOB with activity.  O2 sats were 96-98% during ambulation and HR was near 100 throughout.  Pt will benefit from skilled PT in acute venue to address deficits.  PT recommends ST SNF for follow up at D/C or if pt/family wanting to return to ALF, 24/7 supervision/assist recommended initially.      PT Assessment  Patient needs continued PT services    Follow Up Recommendations  Home health PT;Supervision/Assistance - 24 hour;SNF    Does the patient have the potential to tolerate intense rehabilitation      Barriers to Discharge Decreased caregiver support      Equipment Recommendations  None recommended by PT    Recommendations for Other Services OT consult   Frequency Min 3X/week    Precautions / Restrictions Precautions Precautions: Fall Restrictions Weight Bearing Restrictions: No   Pertinent Vitals/Pain No pain, noted increased coughing/congestion and SOB with ambulation.  O2 sats remained in upper 90's throughout.       Mobility  Bed Mobility Bed Mobility: Not assessed Details for Bed Mobility Assistance: Pt in recliner when PT arrived.  Transfers Transfers: Sit to Stand;Stand to Sit Sit to Stand: 1: +2 Total assist;With upper extremity assist;With armrests;From chair/3-in-1 Sit to Stand: Patient Percentage: 70% Stand to Sit: 1: +2 Total assist;With upper extremity assist;With armrests;To chair/3-in-1 Stand to Sit: Patient Percentage: 70% Details for Transfer Assistance: Assist  to rise and steady with cues for hand placement and safety when sitting/standing.  Ambulation/Gait Ambulation/Gait Assistance: 1: +2 Total assist Ambulation/Gait: Patient Percentage: 70% Ambulation Distance (Feet): 60 Feet Assistive device: Rolling walker Ambulation/Gait Assistance Details: Assist to steady throughout with cues for decreased gait speed for safety, upright posture and maintaining position inside of RW with ambulation.   Gait Pattern: Step-through pattern;Decreased stride length;Trunk flexed Stairs: No Wheelchair Mobility Wheelchair Mobility: No    Shoulder Instructions     Exercises     PT Diagnosis: Difficulty walking;Generalized weakness  PT Problem List: Decreased strength;Decreased activity tolerance;Decreased balance;Decreased mobility;Decreased knowledge of use of DME;Decreased safety awareness PT Treatment Interventions: DME instruction;Gait training;Functional mobility training;Therapeutic activities;Therapeutic exercise;Balance training;Patient/family education   PT Goals Acute Rehab PT Goals PT Goal Formulation: With patient Time For Goal Achievement: 06/21/12 Potential to Achieve Goals: Good Pt will go Supine/Side to Sit: with supervision PT Goal: Supine/Side to Sit - Progress: Goal set today Pt will go Sit to Supine/Side: with supervision PT Goal: Sit to Supine/Side - Progress: Goal set today Pt will go Sit to Stand: with supervision PT Goal: Sit to Stand - Progress: Goal set today Pt will go Stand to Sit: with supervision PT Goal: Stand to Sit - Progress: Goal set today Pt will Ambulate: 51 - 150 feet;with supervision;with least restrictive assistive device PT Goal: Ambulate - Progress: Goal set today  Visit Information  Last PT Received On: 06/07/12 Assistance Needed: +2 (safety)    Subjective Data  Subjective: I walk in my apartment by myself.  Patient Stated Goal: to go back home.    Prior Functioning  Home Living Lives With: Other  (Comment) [facility]) Available Help at Discharge: Skilled Nursing Facility Home Access: Level entry Home Layout: One level Home Adaptive Equipment: Walker - rolling Prior Function Level of Independence: Independent with assistive device(s) Able to Take Stairs?: No Driving: No Vocation: Retired Comments: Pt states that she was able to walk by herself in her apartment and was working with physical therapy.  she also states she was getting bathed and dressed herself.   Communication Communication: HOH    Cognition  Overall Cognitive Status: Appears within functional limits for tasks assessed/performed Arousal/Alertness: Awake/alert Orientation Level: Appears intact for tasks assessed Behavior During Session: [facility] for tasks performed    Extremity/Trunk Assessment Right Lower Extremity Assessment RLE ROM/Strength/Tone: Deficits RLE ROM/Strength/Tone Deficits: Generalized weakness, grossly WFL per functional assessment.  Has unna boot on BLE due to "weeping" wounds.  Left Lower Extremity Assessment LLE ROM/Strength/Tone: Deficits LLE ROM/Strength/Tone Deficits: Generalized weakness, grossly WFL per functional assessment.  Has unna boot on BLE due to "weeping" wounds.  Trunk Assessment Trunk Assessment: Kyphotic   Balance    End of Session PT - End of Session Equipment Utilized During Treatment: Gait belt Activity Tolerance: Patient limited by fatigue;Other (comment) (coughing) Patient left: in chair;with call bell/phone within reach Nurse Communication: Mobility status  GP     Page, Meribeth Mattes 06/07/2012, 12:30 PM

## 2012-06-07 NOTE — Progress Notes (Signed)
ANTIBIOTIC CONSULT NOTE - Follow up  Pharmacy Consult for Levaquin Indication: Pulmonary infection  Allergies  Allergen Reactions  . Prednisone Other (See Comments)    Delirium   . Sertraline Hcl Other (See Comments)    Delirium     Patient Measurements: Height: 5' (152.4 cm) Weight: 119 lb 14.9 oz (54.4 kg) IBW/kg (Calculated) : 45.5   Vital Signs: Temp: 98.5 F (36.9 C) (12/08 0500) Temp src: Oral (12/08 0500) BP: 112/40 mmHg (12/08 0500) Pulse Rate: 77  (12/08 0500)  Labs:  Basename 06/06/12 0525 06/05/12 0430  WBC 3.0* 2.5*  HGB 9.1* 9.0*  PLT 168 162  LABCREA -- --  CREATININE 1.35* 1.20*   Estimated Creatinine Clearance: 18.7 ml/min (by C-G formula based on Cr of 1.35).    Microbiology: Recent Results (from the past 720 hour(s))  URINE CULTURE     Status: Normal   Collection Time   06/04/12  8:30 AM      Component Value Range Status Comment   Specimen Description URINE, CLEAN CATCH   Final    Special Requests NONE   Final    Culture  Setup Time 06/04/2012 15:14   Final    Colony Count 35,000 COLONIES/ML   Final    Culture     Final    Value: Multiple bacterial morphotypes present, none predominant. Suggest appropriate recollection if clinically indicated.   Report Status 06/05/2012 FINAL   Final   CULTURE, BLOOD (ROUTINE X 2)     Status: Normal   Collection Time   06/04/12  8:40 AM      Component Value Range Status Comment   Specimen Description BLOOD RIGHT WRIST   Final    Special Requests BOTTLES DRAWN AEROBIC AND ANAEROBIC 4CC   Final    Culture  Setup Time 06/04/2012 14:09   Final    Culture     Final    Value: STAPHYLOCOCCUS SPECIES (COAGULASE NEGATIVE)     Note: THE SIGNIFICANCE OF ISOLATING THIS ORGANISM FROM A SINGLE SET OF BLOOD CULTURES WHEN MULTIPLE SETS ARE DRAWN IS UNCERTAIN. PLEASE NOTIFY THE MICROBIOLOGY DEPARTMENT WITHIN ONE WEEK IF SPECIATION AND SENSITIVITIES ARE REQUIRED.     Note: Gram Stain Report Called to,Read Back By and Verified  With: AMY WESTBERG 06/05/12 1135 BY SMITHERSJ   Report Status 06/06/2012 FINAL   Final   CULTURE, BLOOD (ROUTINE X 2)     Status: Normal (Preliminary result)   Collection Time   06/04/12  8:47 AM      Component Value Range Status Comment   Specimen Description BLOOD RIGHT FOREARM   Final    Special Requests BOTTLES DRAWN AEROBIC AND ANAEROBIC 5CC   Final    Culture  Setup Time 06/04/2012 14:09   Final    Culture     Final    Value:        BLOOD CULTURE RECEIVED NO GROWTH TO DATE CULTURE WILL BE HELD FOR 5 DAYS BEFORE ISSUING A FINAL NEGATIVE REPORT   Report Status PENDING   Incomplete   MRSA PCR SCREENING     Status: Normal   Collection Time   06/05/12  2:55 AM      Component Value Range Status Comment   MRSA by PCR NEGATIVE  NEGATIVE Final      Medications:  Anti-infectives     Start     Dose/Rate Route Frequency Ordered Stop   06/07/12 1000   levofloxacin (LEVAQUIN) tablet 250 mg  250 mg Oral Daily 06/07/12 0823     06/05/12 1400   Levofloxacin (LEVAQUIN) IVPB 250 mg  Status:  Discontinued        250 mg 50 mL/hr over 60 Minutes Intravenous Every 24 hours 06/04/12 1316 06/07/12 0823   06/05/12 1000   trimethoprim (TRIMPEX) tablet 100 mg        100 mg Oral Daily 06/04/12 1300     06/04/12 1400   levofloxacin (LEVAQUIN) IVPB 500 mg        500 mg 100 mL/hr over 60 Minutes Intravenous  Once 06/04/12 1316 06/04/12 1453          Assessment:  78 y/o F presented to ED with wheezing and cough, to begin empiric Levaquin for pulmonary infection.  Estimated CrCl is low (18 mL/min) secondary to age and weight.  SCr increased to 1.35.  Urine culture with 35k multiple species, 1/2 blood culture with Coag neg staph.  Day #4 Levaquin  Goal of Therapy:   Eradication of infection; Adjust Levaquin dosage for renal function  Plan:  Continue Levaquin 250 mg IV q24h. Follow SCr, cultures, hospital course.  Lynann Beaver PharmD, BCPS Pager [phone] 06/07/2012 2:15  PM

## 2012-06-08 LAB — BASIC METABOLIC PANEL
Calcium: 8.8 mg/dL (ref 8.4–10.5)
Creatinine, Ser: 1.35 mg/dL — ABNORMAL HIGH (ref 0.50–1.10)
GFR calc Af Amer: 38 mL/min — ABNORMAL LOW (ref >90)

## 2012-06-08 LAB — CBC WITH DIFFERENTIAL/PLATELET
Basophils Absolute: 0 10*3/uL (ref 0.0–0.1)
Basophils Relative: 1 % (ref 0–1)
Eosinophils Relative: 0 % (ref 0–5)
Lymphocytes Relative: 28 % (ref 12–46)
MCHC: 32.6 g/dL (ref 30.0–36.0)
Neutro Abs: 1.7 10*3/uL (ref 1.7–7.7)
Platelets: 158 10*3/uL (ref 150–400)
RDW: 13.4 % (ref 11.5–15.5)
WBC: 2.9 10*3/uL — ABNORMAL LOW (ref 4.0–10.5)

## 2012-06-08 MED ORDER — METHYLPREDNISOLONE SODIUM SUCC 40 MG IJ SOLR
20.0000 mg | Freq: Two times a day (BID) | INTRAMUSCULAR | Status: DC
  Administered 2012-06-08 – 2012-06-09 (×4): 20 mg via INTRAVENOUS
  Filled 2012-06-08 (×7): qty 0.5

## 2012-06-08 MED ORDER — ALBUTEROL SULFATE (5 MG/ML) 0.5% IN NEBU: 2.5000 mg | INHALATION_SOLUTION | Freq: Four times a day (QID) | RESPIRATORY_TRACT | Status: DC | PRN

## 2012-06-08 NOTE — Progress Notes (Signed)
Clinical Social Worker met with pt and pt daughter at bedside and provided SNF bed offers. Clinical Social Worker clarified pt daughters questions. Pt and pt daughter first choice is [facility], second choice is [facility], and third and fourth choices are [facility] and [facility]. Clinical Social Worker notified [facility] of pt interest in facility. Clinical Social Worker contacted MD office and faxed 30 day note for signature and placed in shadow chart for signature if not returned during fax. Pt currently has a [facility] which needs to be resubmitted as SNF cannot accept Z pasarr, therefore 30 day note is needed to resubmit pasarr. Clinical Publishing rights manager information and will submit once 30 day note signed. Clinical Social Worker to continue to follow and facilitate pt discharge needs when pt medically ready for discharge and pasarr received.  Jacklynn Lewis, MSW, LCSWA (coverage) Clinical Social Work [phone]

## 2012-06-08 NOTE — Progress Notes (Addendum)
Subjective: Patient extremely wheezy this morning.  Did sleep well last night.  She has apparently an intolerance to prednisone and that it causes delirium.  I spoke with her daughter, Steward Drone, this morning he said that she had delirium with a prednisone taper at one time.  On exam today he her symptoms are entirely consistent with bronchospasm and we will try low-dose Solu-Medrol 20 milligrams every 12 hours to see if she tolerates that.  Her daughter and I both feel that the benefits outweigh the risks at this time  Objective: Weight change: -3.2 kg (-7 lb 0.9 oz)  Intake/Output Summary (Last 24 hours) at 06/08/12 0756 Last data filed at 06/08/12 0600  Gross per 24 hour  Intake   1020 ml  Output    700 ml  Net    320 ml   Filed Vitals:   06/07/12 0955 06/07/12 1458 06/07/12 2100 06/08/12 0626  BP:  156/46 141/49 123/71  Pulse:  89 76 75  Temp:  97.9 F (36.6 C) 97.6 F (36.4 C) 97.9 F (36.6 C)  TempSrc:  Oral Axillary Oral  Resp:  20 18 22   Height:      Weight:    51.2 kg (112 lb 14 oz)  SpO2: 99% 100% 97% 99%   General Appearance: Alert, cooperative, no distress, appears stated age Head: Normocephalic, without obvious abnormality, atraumatic.  Oropharynx is very dry.  Tongue is dry Neck: Supple, symmetrical Lungs: Expiratory wheezes present with diffuse rhonchi with cough but no rales Heart: Regular rate and rhythm, S1 and S2 normal, no murmur, rub or gallop Abdomen: Soft, non-tender, bowel sounds active all four quadrants, no masses, no organomegaly Extremities: Extremities normal, atraumatic, no cyanosis or edema Pulses: 2+ and symmetric all extremities Skin: Skin color, texture, turgor normal, no rashes or lesions Neuro: CNII-XII intact. Normal strength, sensation and reflexes throughout.  Patient alert and conversive   Lab Results:  [facility] 06/06/12 0525  NA 133*  K 4.1  CL 96  CO2 29  GLUCOSE 96  BUN 27*  CREATININE 1.35*  CALCIUM 8.3*  MG --  PHOS --    No results found for this basename: AST:2,ALT:2,ALKPHOS:2,BILITOT:2,PROT:2,ALBUMIN:2 in the last 72 hours No results found for this basename: LIPASE:2,AMYLASE:2 in the last 72 hours  Basename 06/06/12 0525  WBC 3.0*  NEUTROABS --  HGB 9.1*  HCT 28.3*  MCV 95.0  PLT 168   No results found for this basename: CKTOTAL:3,CKMB:3,CKMBINDEX:3,TROPONINI:3 in the last 72 hours No components found with this basename: POCBNP:3 No results found for this basename: DDIMER:2 in the last 72 hours No results found for this basename: HGBA1C:2 in the last 72 hours No results found for this basename: CHOL:2,HDL:2,LDLCALC:2,TRIG:2,CHOLHDL:2,LDLDIRECT:2 in the last 72 hours No results found for this basename: TSH,T4TOTAL,FREET3,T3FREE,THYROIDAB in the last 72 hours No results found for this basename: VITAMINB12:2,FOLATE:2,FERRITIN:2,TIBC:2,IRON:2,RETICCTPCT:2 in the last 72 hours  Studies/Results: No results found. Medications: Scheduled Meds:   . ipratropium  0.5 mg Nebulization TID   And  . albuterol  2.5 mg Nebulization TID  . budesonide-formoterol  2 puff Inhalation BID  . cloNIDine  0.1 mg Oral BID  . diltiazem  120 mg Oral Daily  . enoxaparin (LOVENOX) injection  30 mg Subcutaneous Q24H  . feeding supplement  237 mL Oral BID BM  . gabapentin  100 mg Oral QHS  . levofloxacin  250 mg Oral Daily  . levothyroxine  25 mcg Oral QAC breakfast  . LORazepam  0.5-1 mg Oral QHS  . methylPREDNISolone (  SOLU-MEDROL) injection  20 mg Intravenous Q12H  . mirtazapine  30 mg Oral QHS  . pantoprazole  40 mg Oral Daily  . potassium chloride SA  20 mEq Oral Daily  . sodium chloride  3 mL Intravenous Q12H  . traZODone  25 mg Oral QHS  . trimethoprim  100 mg Oral Daily  . [DISCONTINUED] furosemide  40 mg Oral Daily  . [DISCONTINUED] levofloxacin (LEVAQUIN) IV  250 mg Intravenous Q24H   Continuous Infusions:  PRN Meds:.sodium chloride, albuterol, guaiFENesin-dextromethorphan, oxyCODONE-acetaminophen,  sodium chloride  Assessment/Plan: Assessment/Plan  CHF exacerbation - no evidence of CHF on exam.  Mouth is dry.  We'll hold her Lasix today and check a bmet now and in a.m. Acute bronchitis  Continue Levaquin- now on by mouth.  We'll try IV Solu-Medrol 20 milligrams every 12 hours and hope that she can tolerate this.  We'll make sure that she has nebulized steroids.  Allergy to prednisone was delirium with higher doses with a prednisone taper according to her daughter. Atrial fibrillation  Continue to monitor the patient and continue Cardizem 120 mg by mouth daily  Patient does not take anticoagulation therapy secondary to risk for falls, et Karie Soda.  Hypertension  On Catapres, Cardizem.  Leg ulcers  Continue .una boot tx  DVT prophylaxis  Lovenox  Disposition: PT counsult;  will need short term SNF most likely- just starting to transfer out of bed.  Still wheezing and is not ready for discharge       LOS: 4 days   Hampton Wixom NEVILL 06/08/2012, 7:56 AM

## 2012-06-09 ENCOUNTER — Inpatient Hospital Stay (HOSPITAL_COMMUNITY): Payer: Medicare Other

## 2012-06-09 LAB — CBC WITH DIFFERENTIAL/PLATELET
Basophils Absolute: 0 10*3/uL (ref 0.0–0.1)
Eosinophils Relative: 0 % (ref 0–5)
Lymphocytes Relative: 14 % (ref 12–46)
Lymphs Abs: 0.4 10*3/uL — ABNORMAL LOW (ref 0.7–4.0)
MCV: 92.7 fL (ref 78.0–100.0)
Neutro Abs: 2.1 10*3/uL (ref 1.7–7.7)
Neutrophils Relative %: 78 % — ABNORMAL HIGH (ref 43–77)
Platelets: 186 10*3/uL (ref 150–400)
RBC: 3.27 MIL/uL — ABNORMAL LOW (ref 3.87–5.11)
RDW: 13.2 % (ref 11.5–15.5)
WBC: 2.7 10*3/uL — ABNORMAL LOW (ref 4.0–10.5)

## 2012-06-09 LAB — GLUCOSE, CAPILLARY
Glucose-Capillary: 237 mg/dL — ABNORMAL HIGH (ref 70–99)
Glucose-Capillary: 199 mg/dL — ABNORMAL HIGH (ref 70–99)
Glucose-Capillary: 223 mg/dL — ABNORMAL HIGH (ref 70–99)

## 2012-06-09 LAB — BASIC METABOLIC PANEL
CO2: 30 mEq/L (ref 19–32)
Calcium: 9.1 mg/dL (ref 8.4–10.5)
GFR calc Af Amer: 41 mL/min — ABNORMAL LOW (ref >90)
Sodium: 134 mEq/L — ABNORMAL LOW (ref 135–145)

## 2012-06-09 LAB — PRO B NATRIURETIC PEPTIDE: Pro B Natriuretic peptide (BNP): 1494 pg/mL — ABNORMAL HIGH (ref 0–450)

## 2012-06-09 MED ORDER — INSULIN ASPART 100 UNIT/ML ~~LOC~~ SOLN
0.0000 [IU] | Freq: Three times a day (TID) | SUBCUTANEOUS | Status: DC
  Administered 2012-06-09: 5 [IU] via SUBCUTANEOUS
  Administered 2012-06-09: 3 [IU] via SUBCUTANEOUS
  Administered 2012-06-10: 2 [IU] via SUBCUTANEOUS
  Administered 2012-06-10: 5 [IU] via SUBCUTANEOUS

## 2012-06-09 MED ORDER — FUROSEMIDE 10 MG/ML IJ SOLN
40.0000 mg | Freq: Once | INTRAMUSCULAR | Status: AC
  Administered 2012-06-09: 40 mg via INTRAVENOUS
  Filled 2012-06-09: qty 4

## 2012-06-09 NOTE — Progress Notes (Signed)
Subjective: Patient much better today.  Cough is less congested.  Tolerating IV Solu-Medrol well.  We'll continue current dosing.  Hopefully can discharge tomorrow to skilled nursing facility for further care  Objective: Weight change: -0.3 kg (-10.6 oz)  Intake/Output Summary (Last 24 hours) at 06/09/12 0737 Last data filed at 06/08/12 2100  Gross per 24 hour  Intake    480 ml  Output    801 ml  Net   -321 ml   Filed Vitals:   06/08/12 1814 06/08/12 2016 06/08/12 2130 06/09/12 0620  BP:   139/39 158/65  Pulse:   89 84  Temp:   98.8 F (37.1 C) 98 F (36.7 C)  TempSrc:   Oral Oral  Resp:   21 20  Height:      Weight:    50.9 kg (112 lb 3.4 oz)  SpO2: 97% 92% 98% 98%    General Appearance: Alert, cooperative, no distress, appears stated age  Head: Normocephalic, without obvious abnormality, atraumatic. Oropharynx is still very dry. Tongue is dry  Neck: Supple, symmetrical  Lungs: Expiratory wheezes much improved and rhonchi improved Heart: Regular rate and rhythm, S1 and S2 normal, no murmur, rub or gallop  Abdomen: Soft, non-tender, bowel sounds active all four quadrants, no masses, no organomegaly  Extremities: Extremities normal, atraumatic, no cyanosis or edema  Pulses: 2+ and symmetric all extremities  Skin: Skin color, texture, turgor normal, no rashes or lesions  Neuro: CNII-XII intact. Normal strength, sensation and reflexes throughout. Patient alert and conversive  Lab Results:  Basename 06/09/12 0507 06/08/12 0820  NA 134* 133*  K 5.4* 4.1  CL 98 95*  CO2 30 31  GLUCOSE 206* 122*  BUN 36* 31*  CREATININE 1.26* 1.35*  CALCIUM 9.1 8.8  MG -- --  PHOS -- --   No results found for this basename: AST:2,ALT:2,ALKPHOS:2,BILITOT:2,PROT:2,ALBUMIN:2 in the last 72 hours No results found for this basename: LIPASE:2,AMYLASE:2 in the last 72 hours  Basename 06/09/12 0507 06/08/12 0820  WBC 2.7* 2.9*  NEUTROABS 2.1 1.7  HGB 10.0* 9.7*  HCT 30.3* 29.8*  MCV 92.7  94.0  PLT 186 158   No results found for this basename: CKTOTAL:3,CKMB:3,CKMBINDEX:3,TROPONINI:3 in the last 72 hours No components found with this basename: POCBNP:3 No results found for this basename: DDIMER:2 in the last 72 hours No results found for this basename: HGBA1C:2 in the last 72 hours No results found for this basename: CHOL:2,HDL:2,LDLCALC:2,TRIG:2,CHOLHDL:2,LDLDIRECT:2 in the last 72 hours No results found for this basename: TSH,T4TOTAL,FREET3,T3FREE,THYROIDAB in the last 72 hours No results found for this basename: VITAMINB12:2,FOLATE:2,FERRITIN:2,TIBC:2,IRON:2,RETICCTPCT:2 in the last 72 hours  Studies/Results: No results found. Medications: Scheduled Meds:   . ipratropium  0.5 mg Nebulization TID   And  . albuterol  2.5 mg Nebulization TID  . budesonide-formoterol  2 puff Inhalation BID  . cloNIDine  0.1 mg Oral BID  . diltiazem  120 mg Oral Daily  . enoxaparin (LOVENOX) injection  30 mg Subcutaneous Q24H  . feeding supplement  237 mL Oral BID BM  . gabapentin  100 mg Oral QHS  . levofloxacin  250 mg Oral Daily  . levothyroxine  25 mcg Oral QAC breakfast  . LORazepam  0.5-1 mg Oral QHS  . methylPREDNISolone (SOLU-MEDROL) injection  20 mg Intravenous Q12H  . mirtazapine  30 mg Oral QHS  . pantoprazole  40 mg Oral Daily  . potassium chloride SA  20 mEq Oral Daily  . sodium chloride  3 mL Intravenous Q12H  .  traZODone  25 mg Oral QHS  . trimethoprim  100 mg Oral Daily  . [DISCONTINUED] furosemide  40 mg Oral Daily   Continuous Infusions:  PRN Meds:.sodium chloride, albuterol, guaiFENesin-dextromethorphan, oxyCODONE-acetaminophen, sodium chloride  Assessment/Plan:  Assessment/Plan  CHF exacerbation - no evidence of CHF on exam. Mouth is dry. We'll continue to hold her Lasix today and check a bmet now and in a.m.  Acute bronchitis  Continue Levaquin- now on by mouth. We'll continue IV Solu-Medrol 20 milligrams every 12 hours and hope that she can tolerate  this.  Plan to change to oral prednisone in a.m. lordosis We'll make sure that she has nebulized steroids. Allergy to prednisone was delirium with higher doses with a prednisone taper according to her daughter.  Atrial fibrillation  Continue to monitor the patient and continue Cardizem 120 mg by mouth daily  Patient does not take anticoagulation therapy secondary to risk for falls, et Karie Soda.  Hypertension  On Catapres, Cardizem.  Leg ulcers  Continue .una boot tx  DVT prophylaxis  Lovenox  Disposition: PT counsult; will need short term SNF most likely- just starting to transfer out of bed.  He be ready for discharged to [facility] bed in a.m., Wednesday, December 11    LOS: 5 days   Tregan Read NEVILL 06/09/2012, 7:37 AM

## 2012-06-09 NOTE — Progress Notes (Signed)
Dr. Kevan Ny called with chest x-ray and lab results. Will continuing to monitor.

## 2012-06-09 NOTE — Progress Notes (Signed)
Patient has become increasingly short of breath, expiratory wheezes and crackles bilaterally, RR 28 SPO2 99% on 2L [place] BP 147/39 HR 91. Dr Kevan Ny updated, new orders received, will continue to monitor closely.

## 2012-06-09 NOTE — Progress Notes (Signed)
PT Cancellation Note  Patient Details Name: [name] [name] MRN: [id number] DOB: 03/09/1917   Cancelled Treatment:    Reason Eval/Treat Not Completed: Medical issues which prohibited therapy.  Per RN, pt has had increased trouble breathing today and has just gotten back to bed from being in chair.  Pt notably fatigued.  Will check back with pt tomorrow.   Thanks,    Page, Meribeth Mattes 06/09/2012, 4:29 PM

## 2012-06-10 LAB — CBC WITH DIFFERENTIAL/PLATELET
Basophils Absolute: 0 10*3/uL (ref 0.0–0.1)
Eosinophils Relative: 0 % (ref 0–5)
Lymphocytes Relative: 10 % — ABNORMAL LOW (ref 12–46)
Lymphs Abs: 0.6 10*3/uL — ABNORMAL LOW (ref 0.7–4.0)
MCV: 92.9 fL (ref 78.0–100.0)
Neutrophils Relative %: 84 % — ABNORMAL HIGH (ref 43–77)
Platelets: 208 10*3/uL (ref 150–400)
RBC: 3.12 MIL/uL — ABNORMAL LOW (ref 3.87–5.11)
RDW: 13.4 % (ref 11.5–15.5)
WBC: 5.4 10*3/uL (ref 4.0–10.5)

## 2012-06-10 LAB — BASIC METABOLIC PANEL
Calcium: 9.2 mg/dL (ref 8.4–10.5)
Creatinine, Ser: 1.23 mg/dL — ABNORMAL HIGH (ref 0.50–1.10)
GFR calc Af Amer: 42 mL/min — ABNORMAL LOW (ref >90)
GFR calc non Af Amer: 37 mL/min — ABNORMAL LOW (ref >90)
Sodium: 134 mEq/L — ABNORMAL LOW (ref 135–145)

## 2012-06-10 LAB — GLUCOSE, CAPILLARY: Glucose-Capillary: 140 mg/dL — ABNORMAL HIGH (ref 70–99)

## 2012-06-10 MED ORDER — PREDNISONE 20 MG PO TABS: 20.0000 mg | ORAL_TABLET | Freq: Every day | ORAL | Status: AC

## 2012-06-10 MED ORDER — ALBUTEROL SULFATE (5 MG/ML) 0.5% IN NEBU: 2.5000 mg | INHALATION_SOLUTION | Freq: Three times a day (TID) | RESPIRATORY_TRACT | Status: DC

## 2012-06-10 MED ORDER — PREDNISONE 20 MG PO TABS
20.0000 mg | ORAL_TABLET | Freq: Every day | ORAL | Status: DC
  Administered 2012-06-10: 20 mg via ORAL
  Filled 2012-06-10 (×3): qty 1

## 2012-06-10 MED ORDER — FUROSEMIDE 20 MG PO TABS
20.0000 mg | ORAL_TABLET | Freq: Every day | ORAL | Status: DC
  Administered 2012-06-10: 20 mg via ORAL
  Filled 2012-06-10: qty 1

## 2012-06-10 MED ORDER — ALBUTEROL SULFATE (5 MG/ML) 0.5% IN NEBU: 2.5000 mg | INHALATION_SOLUTION | Freq: Four times a day (QID) | RESPIRATORY_TRACT | Status: DC | PRN

## 2012-06-10 MED ORDER — IPRATROPIUM BROMIDE 0.02 % IN SOLN: 0.5000 mg | Freq: Three times a day (TID) | RESPIRATORY_TRACT | Status: DC

## 2012-06-10 NOTE — Discharge Summary (Signed)
Physician Discharge Summary  NAME:[name] [name]  ZOX:[id number]  DOB: 01/04/1917   Admit date: 06/04/2012 Discharge date: 06/10/2012  Discharge Diagnoses:  Active Problems: Bronchitis with bronchospasm Congestive heart failure - diastolic dysfunction Chronic atrial fibrillation Confusion on steroids Hypertension Chronic leg ulcers History of diverticulosis with diverticulitis Hypothyroidism History of duodenal ulcer Sciatica DJD Gallstones Sensitivity to steroids with confusion History of esophageal stricture with esophageal stenosis History of B12 deficiency   Discharge Condition: Improved  Hospital Course: 78 year old female admitted on December 5 213 with worsening shortness of breath for several days.  Patient was initially started on Levaquin and add respiratory treatments but no steroids.  She continued to have a very congested cough and expiratory wheezes and ultimately she was started on IV Solu-Medrol with excellent response with improvement in bronchospasm and wheezing.  She is now on prednisone 20 milligrams daily and has a history of some delirium on prednisone but the benefits of using prednisone at this time outweigh the hazards and would like to taper her over 6 days, taking 20 milligrams daily for 3 days and then 10 milligrams daily for 3 days and then discontinue this if possible. The day prior to discharge she did develop shortness of breath which responded to Lasix which had been held because of relative dehydration.  She will be discharged on her usual dose of 40 milligrams of Lasix daily orally  Discharge physical exam:  Patient is alert and mildly confused but able to carry on conversation that is meaningful.  She is on steroids which may cause some hallucination and confusion but which are necessary currently  Filed Vitals:   06/09/12 1502 06/09/12 2024 06/09/12 2116 06/10/12 0615  BP: 135/50 164/59  143/50  Pulse: 103 87  79  Temp: 97.8 F (36.6 C)  97.1 F (36.2 C)  97.1 F (36.2 C)  TempSrc: Oral Oral  Oral  Resp: 21 20  19   Height:      Weight:    49.3 kg (108 lb 11 oz)  SpO2: 98% 99% 98% 100%   General Appearance: Alert, cooperative, mild distress with mild concern about having her dentures when she eats breakfast, appears stated age, moderately confused  Head: Normocephalic, without obvious abnormality, atraumatic. Oropharynx is still very dry. Tongue is dry  Neck: Supple, symmetrical  Lungs: Faint rhonchi with cough but no wheezes or  Heart: Irregular rhythm, S1 and S2 normal, no murmur, rub or gallop  Abdomen: Soft, non-tender, bowel sounds active all four quadrants, no masses, no organomegaly  Extremities: Legs without edema and wrapped  Pulses: 2+ and symmetric all extremities  Skin: Skin color, texture, turgor normal, no rashes or lesions  Neuro: CNII-XII intact. Normal strength, sensation and reflexes throughout. Patient alert and conversive   Consults: N/A  Disposition: 03-Skilled Nursing Facility  Discharge Orders    Future Orders Please Complete By Expires   Diet - low sodium heart healthy      Increase activity slowly      Call MD for:  temperature >100.4      Call MD for:  difficulty breathing, headache or visual disturbances          Medication List     As of 06/10/2012  7:14 AM    STOP taking these medications         albuterol 108 (90 BASE) MCG/ACT inhaler   Commonly known as: PROVENTIL HFA;VENTOLIN HFA      TAKE these medications  albuterol (5 MG/ML) 0.5% nebulizer solution   Commonly known as: PROVENTIL   Take 0.5 mLs (2.5 mg total) by nebulization every 6 (six) hours as needed for wheezing or shortness of breath.      albuterol (5 MG/ML) 0.5% nebulizer solution   Commonly known as: PROVENTIL   Take 0.5 mLs (2.5 mg total) by nebulization 3 (three) times daily.      cloNIDine 0.1 MG tablet   Commonly known as: CATAPRES   Take 0.1 mg by mouth 2 (two) times daily.      diltiazem  120 MG 24 hr capsule   Commonly known as: TIAZAC   Take 120 mg by mouth daily.      feeding supplement Liqd   Take 237 mLs by mouth 2 (two) times daily between meals.      furosemide 40 MG tablet   Commonly known as: LASIX   Take 40 mg by mouth daily.      gabapentin 100 MG capsule   Commonly known as: NEURONTIN   Take 100 mg by mouth at bedtime.      guaiFENesin-dextromethorphan 100-10 MG/5ML syrup   Commonly known as: ROBITUSSIN DM   Take 5 mLs by mouth every 4 (four) hours as needed for cough.      ipratropium 0.02 % nebulizer solution   Commonly known as: ATROVENT   Take 2.5 mLs (0.5 mg total) by nebulization 3 (three) times daily.      levothyroxine 25 MCG tablet   Commonly known as: SYNTHROID, LEVOTHROID   TAKE 1 TABLET BY MOUTH ONCE A DAY      LORazepam 0.5 MG tablet   Commonly known as: ATIVAN   Take 0.5-1 mg by mouth at bedtime.      mirtazapine 30 MG tablet   Commonly known as: REMERON   Take 30 mg by mouth at bedtime.      omeprazole 40 MG capsule   Commonly known as: PRILOSEC   Take 40 mg by mouth 2 (two) times daily.      oxyCODONE-acetaminophen 5-325 MG per tablet   Commonly known as: PERCOCET/ROXICET   Take 1 tablet by mouth every 6 (six) hours as needed. Pain.      potassium chloride SA 20 MEQ tablet   Commonly known as: K-DUR,KLOR-CON   Take 20 mEq by mouth daily.      predniSONE 20 MG tablet   Commonly known as: DELTASONE   Take 1 tablet (20 mg total) by mouth daily.      SYMBICORT 80-4.5 MCG/ACT inhaler   Generic drug: budesonide-formoterol   Inhale 2 puffs into the lungs 2 (two) times daily.      traZODone 50 MG tablet   Commonly known as: DESYREL   Take 25 mg by mouth at bedtime.      trimethoprim 100 MG tablet   Commonly known as: TRIMPEX   Take 100 mg by mouth daily.         Things to follow up in the outpatient setting: Need to follow on prednisone because of sensitivity in the past with confusion.  She is mildly confused  currently but not severely.  She is calm.  2 Taper prednisone over 5-6 days and discontinue.  She'll be discharged on a dose of 20 milligrams of prednisone orally daily can probably be tapered to 10 milligrams orally daily in  several days for several more days  Time coordinating discharge: 40 minutes  The results of significant diagnostics from this hospitalization (including imaging,  microbiology, ancillary and laboratory) are listed below for reference.    Significant Diagnostic Studies: Dg Chest 1 View  06/09/2012  *RADIOLOGY REPORT*  Clinical Data: Short of breath  CHEST - 1 VIEW  Comparison: 06/04/2012  Findings: Normal heart size.  Chronic bronchitic and interstitial changes.  No new consolidation or mass.  No pneumothorax. Chronic blunting of the right costophrenic angle.  IMPRESSION: No active cardiopulmonary disease.   Original Report Authenticated By: Jolaine Click, M.D.    Dg Chest 2 View  06/04/2012  *RADIOLOGY REPORT*  Clinical Data: Cough and congestion.  History of congestive heart failure.  CHEST - 2 VIEW  Comparison: 04/03/2012  Findings: Mild blunting of the right costophrenic angle appears to be chronic.  Thoracic spondylosis is present.  Cardiothoracic index 52%, within normal limits for the AP projection.  Mild atherosclerotic calcification of the aortic arch.  IMPRESSION:  1.  Mild chronic blunting of the right costophrenic angle potentially reflecting a small pleural effusion. 2.  Thoracic spondylosis. 3.  Mild atherosclerotic calcification of the aortic arch.   Original Report Authenticated By: Gaylyn Rong, M.D.     Microbiology: Recent Results (from the past 240 hour(s))  URINE CULTURE     Status: Normal   Collection Time   06/04/12  8:30 AM      Component Value Range Status Comment   Specimen Description URINE, CLEAN CATCH   Final    Special Requests NONE   Final    Culture  Setup Time 06/04/2012 15:14   Final    Colony Count 35,000 COLONIES/ML   Final     Culture     Final    Value: Multiple bacterial morphotypes present, none predominant. Suggest appropriate recollection if clinically indicated.   Report Status 06/05/2012 FINAL   Final   CULTURE, BLOOD (ROUTINE X 2)     Status: Normal   Collection Time   06/04/12  8:40 AM      Component Value Range Status Comment   Specimen Description BLOOD RIGHT WRIST   Final    Special Requests BOTTLES DRAWN AEROBIC AND ANAEROBIC 4CC   Final    Culture  Setup Time 06/04/2012 14:09   Final    Culture     Final    Value: STAPHYLOCOCCUS SPECIES (COAGULASE NEGATIVE)     Note: THE SIGNIFICANCE OF ISOLATING THIS ORGANISM FROM A SINGLE SET OF BLOOD CULTURES WHEN MULTIPLE SETS ARE DRAWN IS UNCERTAIN. PLEASE NOTIFY THE MICROBIOLOGY DEPARTMENT WITHIN ONE WEEK IF SPECIATION AND SENSITIVITIES ARE REQUIRED.     Note: Gram Stain Report Called to,Read Back By and Verified With: AMY WESTBERG 06/05/12 1135 BY SMITHERSJ   Report Status 06/06/2012 FINAL   Final   CULTURE, BLOOD (ROUTINE X 2)     Status: Normal (Preliminary result)   Collection Time   06/04/12  8:47 AM      Component Value Range Status Comment   Specimen Description BLOOD RIGHT FOREARM   Final    Special Requests BOTTLES DRAWN AEROBIC AND ANAEROBIC 5CC   Final    Culture  Setup Time 06/04/2012 14:09   Final    Culture     Final    Value:        BLOOD CULTURE RECEIVED NO GROWTH TO DATE CULTURE WILL BE HELD FOR 5 DAYS BEFORE ISSUING A FINAL NEGATIVE REPORT   Report Status PENDING   Incomplete   MRSA PCR SCREENING     Status: Normal   Collection Time   06/05/12  2:55 AM      Component Value Range Status Comment   MRSA by PCR NEGATIVE  NEGATIVE Final      Labs: Results for orders placed during the hospital encounter of 06/04/12  BASIC METABOLIC PANEL      Component Value Range   Sodium 138  135 - 145 mEq/L   Potassium 3.6  3.5 - 5.1 mEq/L   Chloride 100  96 - 112 mEq/L   CO2 27  19 - 32 mEq/L   Glucose, Bld 170 (*) 70 - 99 mg/dL   BUN 20  6 - 23  mg/dL   Creatinine, Ser 4.09  0.50 - 1.10 mg/dL   Calcium 8.6  8.4 - 81.1 mg/dL   GFR calc non Af Amer 42 (*) >90 mL/min   GFR calc Af Amer 49 (*) >90 mL/min  URINALYSIS, ROUTINE W REFLEX MICROSCOPIC      Component Value Range   Color, Urine YELLOW  YELLOW   APPearance CLOUDY (*) CLEAR   Specific Gravity, Urine 1.016  1.005 - 1.030   pH 7.5  5.0 - 8.0   Glucose, UA NEGATIVE  NEGATIVE mg/dL   Hgb urine dipstick NEGATIVE  NEGATIVE   Bilirubin Urine NEGATIVE  NEGATIVE   Ketones, ur NEGATIVE  NEGATIVE mg/dL   Protein, ur NEGATIVE  NEGATIVE mg/dL   Urobilinogen, UA 0.2  0.0 - 1.0 mg/dL   Nitrite NEGATIVE  NEGATIVE   Leukocytes, UA TRACE (*) NEGATIVE  URINE CULTURE      Component Value Range   Specimen Description URINE, CLEAN CATCH     Special Requests NONE     Culture  Setup Time 06/04/2012 15:14     Colony Count 35,000 COLONIES/ML     Culture       Value: Multiple bacterial morphotypes present, none predominant. Suggest appropriate recollection if clinically indicated.   Report Status 06/05/2012 FINAL    CULTURE, BLOOD (ROUTINE X 2)      Component Value Range   Specimen Description BLOOD RIGHT WRIST     Special Requests BOTTLES DRAWN AEROBIC AND ANAEROBIC 4CC     Culture  Setup Time 06/04/2012 14:09     Culture       Value: STAPHYLOCOCCUS SPECIES (COAGULASE NEGATIVE)     Note: THE SIGNIFICANCE OF ISOLATING THIS ORGANISM FROM A SINGLE SET OF BLOOD CULTURES WHEN MULTIPLE SETS ARE DRAWN IS UNCERTAIN. PLEASE NOTIFY THE MICROBIOLOGY DEPARTMENT WITHIN ONE WEEK IF SPECIATION AND SENSITIVITIES ARE REQUIRED.     Note: Gram Stain Report Called to,Read Back By and Verified With: AMY WESTBERG 06/05/12 1135 BY SMITHERSJ   Report Status 06/06/2012 FINAL    CULTURE, BLOOD (ROUTINE X 2)      Component Value Range   Specimen Description BLOOD RIGHT FOREARM     Special Requests BOTTLES DRAWN AEROBIC AND ANAEROBIC 5CC     Culture  Setup Time 06/04/2012 14:09     Culture       Value:        BLOOD  CULTURE RECEIVED NO GROWTH TO DATE CULTURE WILL BE HELD FOR 5 DAYS BEFORE ISSUING A FINAL NEGATIVE REPORT   Report Status PENDING    LACTIC ACID, PLASMA      Component Value Range   Lactic Acid, Venous 2.0  0.5 - 2.2 mmol/L  PRO B NATRIURETIC PEPTIDE      Component Value Range   Pro B Natriuretic peptide (BNP) 1391.0 (*) 0 - 450 pg/mL  URINE MICROSCOPIC-ADD ON  Component Value Range   Squamous Epithelial / LPF FEW (*) RARE   WBC, UA 0-2  <3 WBC/hpf   RBC / HPF 0-2  <3 RBC/hpf   Bacteria, UA FEW (*) RARE  POCT I-STAT TROPONIN I      Component Value Range   Troponin i, poc 0.00  0.00 - 0.08 ng/mL   Comment 3           CBC      Component Value Range   WBC 5.0  4.0 - 10.5 K/uL   RBC 3.23 (*) 3.87 - 5.11 MIL/uL   Hemoglobin 10.1 (*) 12.0 - 15.0 g/dL   HCT 78.4 (*) 69.6 - 29.5 %   MCV 95.7  78.0 - 100.0 fL   MCH 31.3  26.0 - 34.0 pg   MCHC 32.7  30.0 - 36.0 g/dL   RDW 28.4  13.2 - 44.0 %   Platelets 188  150 - 400 K/uL  DIFFERENTIAL      Component Value Range   Neutrophils Relative 75  43 - 77 %   Neutro Abs 3.8  1.7 - 7.7 K/uL   Lymphocytes Relative 14  12 - 46 %   Lymphs Abs 0.7  0.7 - 4.0 K/uL   Monocytes Relative 11  3 - 12 %   Monocytes Absolute 0.5  0.1 - 1.0 K/uL   Eosinophils Relative 0  0 - 5 %   Eosinophils Absolute 0.0  0.0 - 0.7 K/uL   Basophils Relative 0  0 - 1 %   Basophils Absolute 0.0  0.0 - 0.1 K/uL  COMPREHENSIVE METABOLIC PANEL      Component Value Range   Sodium 133 (*) 135 - 145 mEq/L   Potassium 4.0  3.5 - 5.1 mEq/L   Chloride 99  96 - 112 mEq/L   CO2 27  19 - 32 mEq/L   Glucose, Bld 95  70 - 99 mg/dL   BUN 20  6 - 23 mg/dL   Creatinine, Ser 1.02 (*) 0.50 - 1.10 mg/dL   Calcium 8.1 (*) 8.4 - 10.5 mg/dL   Total Protein 5.1 (*) 6.0 - 8.3 g/dL   Albumin 2.5 (*) 3.5 - 5.2 g/dL   AST 21  0 - 37 U/L   ALT 8  0 - 35 U/L   Alkaline Phosphatase 71  39 - 117 U/L   Total Bilirubin 0.1 (*) 0.3 - 1.2 mg/dL   GFR calc non Af Amer 38 (*) >90 mL/min    GFR calc Af Amer 44 (*) >90 mL/min  CBC      Component Value Range   WBC 2.5 (*) 4.0 - 10.5 K/uL   RBC 2.88 (*) 3.87 - 5.11 MIL/uL   Hemoglobin 9.0 (*) 12.0 - 15.0 g/dL   HCT 72.5 (*) 36.6 - 44.0 %   MCV 96.2  78.0 - 100.0 fL   MCH 31.3  26.0 - 34.0 pg   MCHC 32.5  30.0 - 36.0 g/dL   RDW 34.7  42.5 - 95.6 %   Platelets 162  150 - 400 K/uL  MRSA PCR SCREENING      Component Value Range   MRSA by PCR NEGATIVE  NEGATIVE  PRO B NATRIURETIC PEPTIDE      Component Value Range   Pro B Natriuretic peptide (BNP) 836.4 (*) 0 - 450 pg/mL  BASIC METABOLIC PANEL      Component Value Range   Sodium 133 (*) 135 - 145 mEq/L   Potassium  4.1  3.5 - 5.1 mEq/L   Chloride 96  96 - 112 mEq/L   CO2 29  19 - 32 mEq/L   Glucose, Bld 96  70 - 99 mg/dL   BUN 27 (*) 6 - 23 mg/dL   Creatinine, Ser 1.61 (*) 0.50 - 1.10 mg/dL   Calcium 8.3 (*) 8.4 - 10.5 mg/dL   GFR calc non Af Amer 33 (*) >90 mL/min   GFR calc Af Amer 38 (*) >90 mL/min  CBC      Component Value Range   WBC 3.0 (*) 4.0 - 10.5 K/uL   RBC 2.98 (*) 3.87 - 5.11 MIL/uL   Hemoglobin 9.1 (*) 12.0 - 15.0 g/dL   HCT 09.6 (*) 04.5 - 40.9 %   MCV 95.0  78.0 - 100.0 fL   MCH 30.5  26.0 - 34.0 pg   MCHC 32.2  30.0 - 36.0 g/dL   RDW 81.1  91.4 - 78.2 %   Platelets 168  150 - 400 K/uL  BASIC METABOLIC PANEL      Component Value Range   Sodium 133 (*) 135 - 145 mEq/L   Potassium 4.1  3.5 - 5.1 mEq/L   Chloride 95 (*) 96 - 112 mEq/L   CO2 31  19 - 32 mEq/L   Glucose, Bld 122 (*) 70 - 99 mg/dL   BUN 31 (*) 6 - 23 mg/dL   Creatinine, Ser 9.56 (*) 0.50 - 1.10 mg/dL   Calcium 8.8  8.4 - 21.3 mg/dL   GFR calc non Af Amer 33 (*) >90 mL/min   GFR calc Af Amer 38 (*) >90 mL/min  CBC WITH DIFFERENTIAL      Component Value Range   WBC 2.9 (*) 4.0 - 10.5 K/uL   RBC 3.17 (*) 3.87 - 5.11 MIL/uL   Hemoglobin 9.7 (*) 12.0 - 15.0 g/dL   HCT 08.6 (*) 57.8 - 46.9 %   MCV 94.0  78.0 - 100.0 fL   MCH 30.6  26.0 - 34.0 pg   MCHC 32.6  30.0 - 36.0 g/dL    RDW 62.9  52.8 - 41.3 %   Platelets 158  150 - 400 K/uL   Neutrophils Relative 59  43 - 77 %   Neutro Abs 1.7  1.7 - 7.7 K/uL   Lymphocytes Relative 28  12 - 46 %   Lymphs Abs 0.8  0.7 - 4.0 K/uL   Monocytes Relative 12  3 - 12 %   Monocytes Absolute 0.4  0.1 - 1.0 K/uL   Eosinophils Relative 0  0 - 5 %   Eosinophils Absolute 0.0  0.0 - 0.7 K/uL   Basophils Relative 1  0 - 1 %   Basophils Absolute 0.0  0.0 - 0.1 K/uL  BASIC METABOLIC PANEL      Component Value Range   Sodium 134 (*) 135 - 145 mEq/L   Potassium 5.4 (*) 3.5 - 5.1 mEq/L   Chloride 98  96 - 112 mEq/L   CO2 30  19 - 32 mEq/L   Glucose, Bld 206 (*) 70 - 99 mg/dL   BUN 36 (*) 6 - 23 mg/dL   Creatinine, Ser 2.44 (*) 0.50 - 1.10 mg/dL   Calcium 9.1  8.4 - 01.0 mg/dL   GFR calc non Af Amer 36 (*) >90 mL/min   GFR calc Af Amer 41 (*) >90 mL/min  CBC WITH DIFFERENTIAL      Component Value Range   WBC 2.7 (*) 4.0 -  10.5 K/uL   RBC 3.27 (*) 3.87 - 5.11 MIL/uL   Hemoglobin 10.0 (*) 12.0 - 15.0 g/dL   HCT 16.1 (*) 09.6 - 04.5 %   MCV 92.7  78.0 - 100.0 fL   MCH 30.6  26.0 - 34.0 pg   MCHC 33.0  30.0 - 36.0 g/dL   RDW 40.9  81.1 - 91.4 %   Platelets 186  150 - 400 K/uL   Neutrophils Relative 78 (*) 43 - 77 %   Neutro Abs 2.1  1.7 - 7.7 K/uL   Lymphocytes Relative 14  12 - 46 %   Lymphs Abs 0.4 (*) 0.7 - 4.0 K/uL   Monocytes Relative 8  3 - 12 %   Monocytes Absolute 0.2  0.1 - 1.0 K/uL   Eosinophils Relative 0  0 - 5 %   Eosinophils Absolute 0.0  0.0 - 0.7 K/uL   Basophils Relative 0  0 - 1 %   Basophils Absolute 0.0  0.0 - 0.1 K/uL  GLUCOSE, CAPILLARY      Component Value Range   Glucose-Capillary 237 (*) 70 - 99 mg/dL  PRO B NATRIURETIC PEPTIDE      Component Value Range   Pro B Natriuretic peptide (BNP) 1494.0 (*) 0 - 450 pg/mL  BASIC METABOLIC PANEL      Component Value Range   Sodium 134 (*) 135 - 145 mEq/L   Potassium 5.2 (*) 3.5 - 5.1 mEq/L   Chloride 97  96 - 112 mEq/L   CO2 27  19 - 32 mEq/L   Glucose,  Bld 203 (*) 70 - 99 mg/dL   BUN 45 (*) 6 - 23 mg/dL   Creatinine, Ser 7.82 (*) 0.50 - 1.10 mg/dL   Calcium 9.2  8.4 - 95.6 mg/dL   GFR calc non Af Amer 37 (*) >90 mL/min   GFR calc Af Amer 42 (*) >90 mL/min  CBC WITH DIFFERENTIAL      Component Value Range   WBC 5.4  4.0 - 10.5 K/uL   RBC 3.12 (*) 3.87 - 5.11 MIL/uL   Hemoglobin 9.6 (*) 12.0 - 15.0 g/dL   HCT 21.3 (*) 08.6 - 57.8 %   MCV 92.9  78.0 - 100.0 fL   MCH 30.8  26.0 - 34.0 pg   MCHC 33.1  30.0 - 36.0 g/dL   RDW 46.9  62.9 - 52.8 %   Platelets 208  150 - 400 K/uL   Neutrophils Relative 84 (*) 43 - 77 %   Neutro Abs 4.5  1.7 - 7.7 K/uL   Lymphocytes Relative 10 (*) 12 - 46 %   Lymphs Abs 0.6 (*) 0.7 - 4.0 K/uL   Monocytes Relative 6  3 - 12 %   Monocytes Absolute 0.3  0.1 - 1.0 K/uL   Eosinophils Relative 0  0 - 5 %   Eosinophils Absolute 0.0  0.0 - 0.7 K/uL   Basophils Relative 0  0 - 1 %   Basophils Absolute 0.0  0.0 - 0.1 K/uL  GLUCOSE, CAPILLARY      Component Value Range   Glucose-Capillary 199 (*) 70 - 99 mg/dL  GLUCOSE, CAPILLARY      Component Value Range   Glucose-Capillary 223 (*) 70 - 99 mg/dL     Signed: Jaxston Chohan NEVILL 06/10/2012, 7:14 AM

## 2012-06-10 NOTE — Progress Notes (Signed)
[facility] called for transport.   Unice Bailey, LCSW [facility] Clinical Social Worker cell #: [phone]

## 2012-06-10 NOTE — Progress Notes (Signed)
Patient is set to discharge to [facility] today. Daughter, [name] to complete admission paperwork with Paula Compton from SNF in patient's room @ 12:30, CSW will call for [facility] transport when they are finished with paperwork.   Clinical Social Work Department CLINICAL SOCIAL WORK PLACEMENT NOTE 06/10/2012  Patient:  [name] [name], [name] [name]  Account Number:  [id number] Admit date:  06/04/2012  Clinical Social Worker:  Doroteo Glassman  Date/time:  06/07/2012 03:20 PM  Clinical Social Work is seeking post-discharge placement for this patient at the following level of care:   SKILLED NURSING   (*CSW will update this form in Epic as items are completed)   06/07/2012  Patient/family provided with [facility] Department of Clinical Social Work's list of facilities offering this level of care within the geographic area requested by the patient (or if unable, by the patient's family).  06/07/2012  Patient/family informed of their freedom to choose among providers that offer the needed level of care, that participate in Medicare, Medicaid or managed care program needed by the patient, have an available bed and are willing to accept the patient.  06/07/2012  Patient/family informed of [facility]' ownership interest in [facility], as well as of the fact that they are under no obligation to receive care at this facility.  PASARR submitted to [employer] on 06/09/2012 PASARR number received from [employer] on 06/09/2012  FL2 transmitted to all facilities in geographic area requested by pt/family on  06/07/2012 FL2 transmitted to all facilities within larger geographic area on   Patient informed that his/her managed care company has contracts with or will negotiate with  certain facilities, including the following:     Patient/family informed of bed offers received:  06/09/2012 Patient chooses bed at [facility] Physician recommends and patient chooses bed at    Patient to be transferred to  [facility] on  06/10/2012 Patient to be transferred to facility by [facility]  The following physician request were entered in Epic:   Additional Comments:  Unice Bailey, LCSW [facility] Clinical Social Worker cell #: [phone]

## 2012-06-26 LAB — HEMOGLOBIN A1C
Hgb A1c MFr Bld: 6.3 %
Mean Plasma Glucose: 134 mg/dL

## 2012-07-20 ENCOUNTER — Telehealth: Payer: Self-pay | Admitting: Internal Medicine

## 2012-07-20 NOTE — Telephone Encounter (Signed)
K. Needs an OV for face-to-face encounter in order to order home health services.

## 2012-07-20 NOTE — Telephone Encounter (Signed)
Caller: Brenda/Child; Phone: [phone]; Reason for Call: Steward Drone, daughter, is calling to make sure Dr Debby Bud is aware that patient is back at [facility].  (pt had been at a skilled nursing facility but she has went back to [facility])

## 2012-07-21 NOTE — Telephone Encounter (Signed)
Will you call pt's daughter and set up an OV?

## 2012-07-22 ENCOUNTER — Ambulatory Visit (INDEPENDENT_AMBULATORY_CARE_PROVIDER_SITE_OTHER): Payer: Medicare Other | Admitting: Internal Medicine

## 2012-07-22 ENCOUNTER — Encounter: Payer: Self-pay | Admitting: Internal Medicine

## 2012-07-22 ENCOUNTER — Telehealth: Payer: Self-pay | Admitting: *Deleted

## 2012-07-22 VITALS — BP 120/62 | HR 86 | Temp 98.3°F | Resp 12 | Wt 116.1 lb

## 2012-07-22 DIAGNOSIS — I509 Heart failure, unspecified: Secondary | ICD-10-CM

## 2012-07-22 DIAGNOSIS — N189 Chronic kidney disease, unspecified: Secondary | ICD-10-CM

## 2012-07-22 DIAGNOSIS — E039 Hypothyroidism, unspecified: Secondary | ICD-10-CM

## 2012-07-22 DIAGNOSIS — Z23 Encounter for immunization: Secondary | ICD-10-CM

## 2012-07-22 DIAGNOSIS — Z7189 Other specified counseling: Secondary | ICD-10-CM

## 2012-07-22 DIAGNOSIS — I10 Essential (primary) hypertension: Secondary | ICD-10-CM

## 2012-07-22 DIAGNOSIS — K269 Duodenal ulcer, unspecified as acute or chronic, without hemorrhage or perforation: Secondary | ICD-10-CM

## 2012-07-22 DIAGNOSIS — F323 Major depressive disorder, single episode, severe with psychotic features: Secondary | ICD-10-CM

## 2012-07-22 DIAGNOSIS — I4891 Unspecified atrial fibrillation: Secondary | ICD-10-CM

## 2012-07-22 MED ORDER — OMEPRAZOLE 40 MG PO CPDR: 40.0000 mg | DELAYED_RELEASE_CAPSULE | Freq: Every day | ORAL | Status: DC

## 2012-07-22 NOTE — Assessment & Plan Note (Signed)
Per patient wishes and family's consent. DNR/DNI, no futile heroic measures.

## 2012-07-22 NOTE — Telephone Encounter (Signed)
Patient scheduled 07/22/12

## 2012-07-22 NOTE — Assessment & Plan Note (Signed)
Lab Results  Component Value Date   TSH 3.712 01/13/2012   Continue present medication

## 2012-07-22 NOTE — Assessment & Plan Note (Signed)
No active symptoms  Plan - continue omeprazole once a day - before breakfast.

## 2012-07-22 NOTE — Telephone Encounter (Signed)
NURSE FROM  PLACE , EBONY JOHNSON, CALLED TO GET CARNIFICATION ON MEDICATION. ON PATIENT FL2 FORM STATES PRILOSEC 40MG  BID. ON THE ORDER SHEET SENT OVER STATE PRILOSEC 40MG  ONE TIME DAY. PLEASE FAX CORRECT ORDER TO [phone].

## 2012-07-22 NOTE — Telephone Encounter (Signed)
Aram Beecham, OT called requesting OT for pt 2 times a week for 3 weeks then 1 time for 1 week. To help pt with safety, endurance, pain control, and build her up for her daily activities. Please advise.

## 2012-07-22 NOTE — Assessment & Plan Note (Signed)
Stable renal function as of December 2013 with creatinine of 1.23, BUN 45

## 2012-07-22 NOTE — Progress Notes (Signed)
Subjective:    Patient ID: [name] [name], female    DOB: 10-Jul-1916, 79 y.o.   MRN: [id number]  HPI [name] [name] presents for evaluation upon d/c from skilled nursing care and on admission to assisted living. She has multiple medical problems including respiratory issues and chronic controlled heart failure. She has had progressive weakness so that she needs assistance for ADLs including bathing and ambulation. She does use a Wheel chair and may ambulate with physical therapy. On her assessement today she will benefit from Home health therapy: nursing to monitor CHF, Physical therapy for conditioning and mobility and occupational therapy to maintain and improve ability to manage ADLs at a lower level of care, i.e moving from SNF to AL.  Today she is doing well  - no increased work of breathing, no signs of decompensated CHF, no pain or discomfort.  Past Medical History  Diagnosis Date  . Routine general medical examination at a health care facility   . Other B-complex deficiencies   . Unspecified hypothyroidism   . Diverticulitis of colon (without mention of hemorrhage)   . Congestive heart failure, unspecified   . Stricture and stenosis of esophagus   . Duodenal ulcer, unspecified as acute or chronic, without hemorrhage, perforation, or obstruction   . Atrial fibrillation   . Edema   . Dysphagia, unspecified   . Depressive type psychosis   . Sciatica   . Other constipation   . Unspecified essential hypertension   . Arthritis   . Gallstones   . PONV (postoperative nausea and vomiting)    Past Surgical History  Procedure Date  . Abdominal hysterectomy   . Cholecystectomy    Family History  Problem Relation Age of Onset  . Cancer Other     breast   . Heart disease Other    History   Social History  . Marital Status: Widowed    Spouse Name: N/A    Number of Children: N/A  . Years of Education: N/A   Occupational History  . Not on file.   Social History Main  Topics  . Smoking status: Never Smoker   . Smokeless tobacco: Former Neurosurgeon    Types: Snuff    Quit date: 01/13/1964  . Alcohol Use: No  . Drug Use: No  . Sexually Active: No   Other Topics Concern  . Not on file   Social History Narrative   Widowed.Lives with daughter, dependent for ADL's/ Home health coming in (9/09)Discussed code status: will not do CPR, or mechanical ventilation.    Current Outpatient Prescriptions on File Prior to Visit  Medication Sig Dispense Refill  . albuterol (PROVENTIL) (5 MG/ML) 0.5% nebulizer solution Take 0.5 mLs (2.5 mg total) by nebulization 3 (three) times daily.  20 mL  1  . cloNIDine (CATAPRES) 0.1 MG tablet Take 0.1 mg by mouth 2 (two) times daily.       . feeding supplement (ENSURE COMPLETE) LIQD Take 237 mLs by mouth 2 (two) times daily between meals.  5688 mL  11  . furosemide (LASIX) 40 MG tablet Take 40 mg by mouth daily.      Marland Kitchen gabapentin (NEURONTIN) 100 MG capsule Take 100 mg by mouth at bedtime.       Marland Kitchen ipratropium (ATROVENT) 0.02 % nebulizer solution Take 2.5 mLs (0.5 mg total) by nebulization 3 (three) times daily.  75 mL  1  . levothyroxine (SYNTHROID, LEVOTHROID) 25 MCG tablet TAKE 1 TABLET BY MOUTH ONCE A DAY  30 tablet  6  . LORazepam (ATIVAN) 0.5 MG tablet Take 0.5-1 mg by mouth at bedtime.      . mirtazapine (REMERON) 30 MG tablet Take 30 mg by mouth at bedtime.      Marland Kitchen oxyCODONE-acetaminophen (PERCOCET) 5-325 MG per tablet Take 1 tablet by mouth every 6 (six) hours as needed. Pain.      . potassium chloride SA (K-DUR,KLOR-CON) 20 MEQ tablet Take 20 mEq by mouth daily.      . SYMBICORT 80-4.5 MCG/ACT inhaler Inhale 2 puffs into the lungs 2 (two) times daily.       Marland Kitchen trimethoprim (TRIMPEX) 100 MG tablet Take 100 mg by mouth daily.       Marland Kitchen guaiFENesin-dextromethorphan (ROBITUSSIN DM) 100-10 MG/5ML syrup Take 5 mLs by mouth every 4 (four) hours as needed for cough.  118 mL        Review of Systems  Constitutional: Negative for fever,  activity change, appetite change and fatigue.  HENT: Negative.  Negative for ear pain, sore throat, trouble swallowing and neck stiffness.   Eyes: Negative.   Respiratory: Negative for apnea, cough, choking, chest tightness and wheezing.   Cardiovascular: Negative for chest pain, palpitations and leg swelling.  Gastrointestinal: Negative.   Genitourinary: Negative.   Musculoskeletal: Negative for back pain, joint swelling and arthralgias.  Skin: Negative.   Neurological: Negative for dizziness, tremors, light-headedness and numbness.  Hematological: Negative.   Psychiatric/Behavioral: Negative for hallucinations, confusion and agitation. The patient is not hyperactive.        Objective:   Physical Exam Filed Vitals:   07/22/12 1417  BP: 120/62  Pulse: 86  Temp: 98.3 F (36.8 C)  Resp: 12   Wt Readings from Last 3 Encounters:  07/22/12 116 lb 1 oz (52.644 kg)  06/10/12 108 lb 11 oz (49.3 kg)  04/06/12 106 lb 7.7 oz (48.3 kg)   Gen'l - well preserved elderly white woman in no distress HEENT - C&S clear, PERRLA Nodes - negative Cor- 2+ radial pulse, RRR Pulm - no increased WOB, no rales or wheezing Abd - soft, BS+ Pelvic/rectal exam - deferred Neuro - A&O x 3 CN II- XII grossly normal. Derm - no signs of skin breakdown.  Lab Results  Component Value Date   WBC 5.4 06/10/2012   HGB 9.6* 06/10/2012   HCT 29.0* 06/10/2012   PLT 208 06/10/2012   GLUCOSE 203* 06/10/2012   ALT 8 06/05/2012   AST 21 06/05/2012   NA 134* 06/10/2012   K 5.2* 06/10/2012   CL 97 06/10/2012   CREATININE 1.23* 06/10/2012   BUN 45* 06/10/2012   CO2 27 06/10/2012   TSH 3.712 01/13/2012   INR 0.98 04/14/2009   HGBA1C 6.3 06/09/2012          Assessment & Plan:

## 2012-07-22 NOTE — Assessment & Plan Note (Addendum)
Stable with no signs of decompensation at today's visit.  Continue present medication: including lasix 40 mg once a day Weigh every day at the same time: for a gain of 2 lbs or more in 2 days increase lasix to twice a day for 2 days. If there is a weight gain and any shortness of breath - call the Doctor.

## 2012-07-22 NOTE — Assessment & Plan Note (Signed)
Stable on present medications.   

## 2012-07-22 NOTE — Assessment & Plan Note (Signed)
BP Readings from Last 3 Encounters:  07/22/12 120/62  06/10/12 129/39  05/26/12 112/58   Good control on present medications

## 2012-07-22 NOTE — Assessment & Plan Note (Signed)
Stable hemodynamically

## 2012-07-23 ENCOUNTER — Other Ambulatory Visit: Payer: Self-pay | Admitting: *Deleted

## 2012-07-23 MED ORDER — OXYCODONE-ACETAMINOPHEN 5-325 MG PO TABS: 1.0000 | ORAL_TABLET | Freq: Four times a day (QID) | ORAL | Status: DC | PRN

## 2012-07-23 NOTE — Telephone Encounter (Signed)
Will correct sheet. Dose is 40 mg daily. Patient seen 1/22 and sent back to facility with a complete office note that included a corrected med list as well as a problem by problem analysis and recommendations. They may want to read that office record - it constitutes an admission note to their facility.. I did not do the original FL-2 at the time of d/c from SNF

## 2012-07-23 NOTE — Telephone Encounter (Signed)
Message faxed to [facility] place to [employer] of correct dose of Prilosec. Also information to referr to office note of 07/22/2012 that was sent back to facility with patient.

## 2012-07-23 NOTE — Telephone Encounter (Signed)
Ok per Dr Norins.  

## 2012-07-25 ENCOUNTER — Encounter (HOSPITAL_COMMUNITY): Payer: Self-pay | Admitting: Emergency Medicine

## 2012-07-25 ENCOUNTER — Emergency Department (HOSPITAL_COMMUNITY): Payer: Medicare Other

## 2012-07-25 ENCOUNTER — Emergency Department (HOSPITAL_COMMUNITY)
Admission: EM | Admit: 2012-07-25 | Discharge: 2012-07-26 | Disposition: A | Discharge status: Home / Self Care | Payer: Medicare Other | Attending: Emergency Medicine | Admitting: Emergency Medicine

## 2012-07-25 DIAGNOSIS — R0602 Shortness of breath: Secondary | ICD-10-CM | POA: Insufficient documentation

## 2012-07-25 DIAGNOSIS — W2209XA Striking against other stationary object, initial encounter: Secondary | ICD-10-CM | POA: Insufficient documentation

## 2012-07-25 DIAGNOSIS — F32A Depression, unspecified: Secondary | ICD-10-CM | POA: Insufficient documentation

## 2012-07-25 DIAGNOSIS — Z862 Personal history of diseases of the blood and blood-forming organs and certain disorders involving the immune mechanism: Secondary | ICD-10-CM | POA: Insufficient documentation

## 2012-07-25 DIAGNOSIS — I509 Heart failure, unspecified: Secondary | ICD-10-CM | POA: Insufficient documentation

## 2012-07-25 DIAGNOSIS — Y9389 Activity, other specified: Secondary | ICD-10-CM | POA: Insufficient documentation

## 2012-07-25 DIAGNOSIS — R5381 Other malaise: Secondary | ICD-10-CM | POA: Insufficient documentation

## 2012-07-25 DIAGNOSIS — Y9289 Other specified places as the place of occurrence of the external cause: Secondary | ICD-10-CM | POA: Insufficient documentation

## 2012-07-25 DIAGNOSIS — F39 Unspecified mood [affective] disorder: Secondary | ICD-10-CM | POA: Insufficient documentation

## 2012-07-25 DIAGNOSIS — IMO0002 Reserved for concepts with insufficient information to code with codable children: Secondary | ICD-10-CM | POA: Insufficient documentation

## 2012-07-25 DIAGNOSIS — F323 Major depressive disorder, single episode, severe with psychotic features: Secondary | ICD-10-CM | POA: Insufficient documentation

## 2012-07-25 DIAGNOSIS — I4891 Unspecified atrial fibrillation: Secondary | ICD-10-CM | POA: Insufficient documentation

## 2012-07-25 DIAGNOSIS — F3289 Other specified depressive episodes: Secondary | ICD-10-CM | POA: Insufficient documentation

## 2012-07-25 DIAGNOSIS — R609 Edema, unspecified: Secondary | ICD-10-CM | POA: Insufficient documentation

## 2012-07-25 DIAGNOSIS — F329 Major depressive disorder, single episode, unspecified: Secondary | ICD-10-CM | POA: Insufficient documentation

## 2012-07-25 DIAGNOSIS — Z8719 Personal history of other diseases of the digestive system: Secondary | ICD-10-CM | POA: Insufficient documentation

## 2012-07-25 DIAGNOSIS — Z8739 Personal history of other diseases of the musculoskeletal system and connective tissue: Secondary | ICD-10-CM | POA: Insufficient documentation

## 2012-07-25 DIAGNOSIS — E039 Hypothyroidism, unspecified: Secondary | ICD-10-CM | POA: Insufficient documentation

## 2012-07-25 DIAGNOSIS — Z8639 Personal history of other endocrine, nutritional and metabolic disease: Secondary | ICD-10-CM | POA: Insufficient documentation

## 2012-07-25 DIAGNOSIS — I10 Essential (primary) hypertension: Secondary | ICD-10-CM | POA: Insufficient documentation

## 2012-07-25 DIAGNOSIS — Z79899 Other long term (current) drug therapy: Secondary | ICD-10-CM | POA: Insufficient documentation

## 2012-07-25 LAB — COMPREHENSIVE METABOLIC PANEL
ALT: 9 U/L (ref 0–35)
Albumin: 2.8 g/dL — ABNORMAL LOW (ref 3.5–5.2)
Alkaline Phosphatase: 83 U/L (ref 39–117)
BUN: 16 mg/dL (ref 6–23)
Chloride: 97 mEq/L (ref 96–112)
Glucose, Bld: 118 mg/dL — ABNORMAL HIGH (ref 70–99)
Potassium: 4.3 mEq/L (ref 3.5–5.1)
Sodium: 132 mEq/L — ABNORMAL LOW (ref 135–145)
Total Bilirubin: 0.1 mg/dL — ABNORMAL LOW (ref 0.3–1.2)
Total Protein: 6 g/dL (ref 6.0–8.3)

## 2012-07-25 LAB — CBC WITH DIFFERENTIAL/PLATELET
Basophils Absolute: 0 10*3/uL (ref 0.0–0.1)
Basophils Relative: 0 % (ref 0–1)
Eosinophils Absolute: 0 10*3/uL (ref 0.0–0.7)
Eosinophils Relative: 0 % (ref 0–5)
HCT: 29.3 % — ABNORMAL LOW (ref 36.0–46.0)
Lymphocytes Relative: 12 % (ref 12–46)
MCHC: 32.8 g/dL (ref 30.0–36.0)
MCV: 90.7 fL (ref 78.0–100.0)
Monocytes Absolute: 0.9 10*3/uL (ref 0.1–1.0)
Platelets: 252 10*3/uL (ref 150–400)
RDW: 14.1 % (ref 11.5–15.5)
WBC: 5.5 10*3/uL (ref 4.0–10.5)

## 2012-07-25 LAB — TROPONIN I: Troponin I: <0.3 ng/mL (ref <0.30)

## 2012-07-25 LAB — PRO B NATRIURETIC PEPTIDE: Pro B Natriuretic peptide (BNP): 1916 pg/mL — ABNORMAL HIGH (ref 0–450)

## 2012-07-25 MED ORDER — OLANZAPINE 2.5 MG PO TABS
2.5000 mg | ORAL_TABLET | Freq: Once | ORAL | Status: DC
Start: 2012-07-25 — End: 2012-07-26

## 2012-07-25 MED ORDER — LORAZEPAM 2 MG/ML IJ SOLN
0.5000 mg | Freq: Once | INTRAMUSCULAR | Status: AC
Start: 2012-07-25 — End: 2012-07-25
  Administered 2012-07-25: 0.5 mg via INTRAVENOUS
  Filled 2012-07-25: qty 1

## 2012-07-25 NOTE — ED Notes (Signed)
Pt notified that urine is needed 

## 2012-07-25 NOTE — ED Notes (Addendum)
Notified RN,Emily that pt has yet to offer urine specimen

## 2012-07-25 NOTE — ED Notes (Signed)
Pt in stating she just doesn't feel good, unable to give specific symptoms, pt states she can't go home tonight, became very anxious at idea of going home, per EMS pt with increased anxiety per family, there was an event within family today that triggered symptoms.

## 2012-07-25 NOTE — ED Notes (Signed)
ZOX:[id number]<AV> Expected date:07/25/12<BR> Expected time: 6:55 PM<BR> Means of arrival:Ambulance<BR> Comments:<BR> Anxiety

## 2012-07-25 NOTE — ED Provider Notes (Signed)
History     CSN: [id number]  Arrival date & time 07/25/12  1859   First MD Initiated Contact with Patient 07/25/12 1937      Chief Complaint  Patient presents with  . Anxiety  . general illness     (Consider location/radiation/quality/duration/timing/severity/associated sxs/prior treatment) HPI This 79 year old female has a history of anxiety, depression, mild dementia, with worsening mood swings and agitation off and on over the last several months, every time she moves from one environment to another she has worsening anxiety and agitation, within the last few day she was just moved from a skilled nursing facility to assisted living and has become more anxious since then. At baseline she is generally weak and it is unchanged recently. She is no fever no change in her baseline mental status no chest pain no bowel pain and no shortness of breath more than usual because she always has some mild shortness of breath, she also is mild baseline edema to her feet, she is no new change in speech vision swallowing or understanding and no new focal or lateralizing weakness or numbness or incoordination. She is brought to the emergency room by her family due to the patient's worsening anxiety and the patient thinks she needs to stay in the hospital due to her anxiety and fear of going home with no specific paranoia in regards to what happened she did go home tonight. The patient is anxious and depressed but is not suicidal homicidal or hallucinating. She is currently oriented to person place and time. Past Medical History  Diagnosis Date  . Routine general medical examination at a health care facility   . Other B-complex deficiencies   . Unspecified hypothyroidism   . Diverticulitis of colon (without mention of hemorrhage)   . Congestive heart failure, unspecified   . Stricture and stenosis of esophagus   . Duodenal ulcer, unspecified as acute or chronic, without hemorrhage, perforation, or  obstruction   . Atrial fibrillation   . Edema   . Dysphagia, unspecified   . Depressive type psychosis   . Sciatica   . Other constipation   . Unspecified essential hypertension   . Arthritis   . Gallstones   . PONV (postoperative nausea and vomiting)    mild dementia and anxiety  Past Surgical History  Procedure Date  . Abdominal hysterectomy   . Cholecystectomy     Family History  Problem Relation Age of Onset  . Cancer Other     breast   . Heart disease Other     History  Substance Use Topics  . Smoking status: Never Smoker   . Smokeless tobacco: Former Neurosurgeon    Types: Snuff    Quit date: 01/13/1964  . Alcohol Use: No    OB History    Grav Para Term Preterm Abortions TAB SAB Ect Mult Living                  Review of Systems 10 Systems reviewed and are negative for acute change except as noted in the HPI. Allergies  Sertraline hcl  Home Medications   Current Outpatient Rx  Name  Route  Sig  Dispense  Refill  . ALBUTEROL SULFATE (2.5 MG/3ML) 0.083% IN NEBU   Nebulization   Take 2.5 mg by nebulization every 6 (six) hours as needed. For shortness of breath         . CLONIDINE HCL 0.1 MG PO TABS   Oral   Take 0.1 mg by mouth  daily.          Marland Kitchen DILTIAZEM HCL ER COATED BEADS 120 MG PO CP24   Oral   Take 120 mg by mouth every morning.          . FUROSEMIDE 40 MG PO TABS   Oral   Take 40 mg by mouth every morning.          Marland Kitchen GABAPENTIN 100 MG PO CAPS   Oral   Take 100 mg by mouth at bedtime.          . IPRATROPIUM BROMIDE 0.02 % IN SOLN   Nebulization   Take 2.5 mLs (0.5 mg total) by nebulization 3 (three) times daily.   75 mL   1   . LEVOTHYROXINE SODIUM 25 MCG PO TABS   Oral   Take 25 mcg by mouth every morning.         Marland Kitchen LORAZEPAM 0.5 MG PO TABS   Oral   Take 0.5 mg by mouth daily as needed. For anxiety         . MIRTAZAPINE 30 MG PO TABS   Oral   Take 30 mg by mouth at bedtime.         . OMEPRAZOLE 40 MG PO CPDR    Oral   Take 40 mg by mouth 2 (two) times daily.         . OXYCODONE-ACETAMINOPHEN 5-325 MG PO TABS   Oral   Take 1 tablet by mouth every 6 (six) hours as needed. For pain         . POTASSIUM CHLORIDE CRYS ER 20 MEQ PO TBCR   Oral   Take 20 mEq by mouth daily.         . SYMBICORT 80-4.5 MCG/ACT IN AERO   Inhalation   Inhale 2 puffs into the lungs 2 (two) times daily.          . TRAZODONE HCL 50 MG PO TABS   Oral   Take 25 mg by mouth at bedtime.         Marland Kitchen TRIMETHOPRIM 100 MG PO TABS   Oral   Take 100 mg by mouth daily.          Marland Kitchen MIRTAZAPINE 45 MG PO TABS   Oral   Take 1 tablet (45 mg total) by mouth at bedtime.   30 tablet   0     BP 140/54  Pulse 92  Temp 98 F (36.7 C) (Oral)  Resp 24  SpO2 99%  Physical Exam  Nursing note and vitals reviewed. Constitutional: She is oriented to person, place, and time.       Awake, alert, nontoxic appearance with baseline speech for patient.  HENT:  Head: Atraumatic.  Mouth/Throat: No oropharyngeal exudate.  Eyes: EOM are normal. Pupils are equal, round, and reactive to light. Right eye exhibits no discharge. Left eye exhibits no discharge.  Neck: Neck supple.  Cardiovascular: Normal rate and regular rhythm.   No murmur heard. Pulmonary/Chest: Effort normal and breath sounds normal. No stridor. No respiratory distress. She has no wheezes. She has no rales. She exhibits no tenderness.  Abdominal: Soft. Bowel sounds are normal. She exhibits no mass. There is no tenderness. There is no rebound.  Musculoskeletal: She exhibits edema. She exhibits no tenderness.       Baseline ROM, moves extremities with no obvious new focal weakness. Mild edema to both feet.  Lymphadenopathy:    She has no cervical adenopathy.  Neurological: She  is alert and oriented to person, place, and time.       Awake, alert, cooperative and aware of situation; motor strength bilaterally; sensation normal to light touch bilaterally; peripheral  visual fields full to confrontation; no facial asymmetry; tongue midline; major cranial nerves appear intact; no pronator drift, normal finger to nose bilaterally, baseline gait without new ataxia.  Skin: No rash noted.  Psychiatric:       Anxious and agitated; no SI/HI/hallucinations    ED Course  Procedures (including critical care time) ECG: Sinus rhythm, ventricular rate 98, normal axis, normal intervals, nonspecific T wave abnormality, no significant change noted compared with Dec13  Patient / Family / Caregiver understand and agree with initial ED impression and plan with expectations set for ED visit.  When discussing ED Course Pt suddenly cried uncontrollably and had recurrent anxiety whenever possibility of discharge mentioned so TelePsych ordered, family states Pt has been like this for years.2355 Labs Reviewed  COMPREHENSIVE METABOLIC PANEL - Abnormal; Notable for the following:    Sodium 132 (*)     Glucose, Bld 118 (*)     Creatinine, Ser 1.25 (*)     Albumin 2.8 (*)     Total Bilirubin 0.1 (*)     GFR calc non Af Amer 36 (*)     GFR calc Af Amer 41 (*)     All other components within normal limits  CBC WITH DIFFERENTIAL - Abnormal; Notable for the following:    RBC 3.23 (*)     Hemoglobin 9.6 (*)     HCT 29.3 (*)     Monocytes Relative 16 (*)     All other components within normal limits  PRO B NATRIURETIC PEPTIDE - Abnormal; Notable for the following:    Pro B Natriuretic peptide (BNP) 1916.0 (*)     All other components within normal limits  URINALYSIS, ROUTINE W REFLEX MICROSCOPIC - Abnormal; Notable for the following:    Hgb urine dipstick LARGE (*)     All other components within normal limits  LACTIC ACID, PLASMA  TROPONIN I  URINE MICROSCOPIC-ADD ON   Dg Chest 2 View  07/25/2012  *RADIOLOGY REPORT*  Clinical Data: Shortness of breath.  Cough.  Anxiety.  CHEST - 2 VIEW  Comparison: 06/09/2012.  Findings: No infiltrate, congestive heart failure or  pneumothorax. Mild central pulmonary vascular prominence with minimal peribronchial thickening unchanged.  Heart size within normal limits. Slightly tortuous aorta.  IMPRESSION: No acute abnormality.  Please see above.   Original Report Authenticated By: Lacy Duverney, M.D.      1. Depression   Anxiety    MDM  Dispo pending.        Hurman Horn, MD 07/27/12 [phone]

## 2012-07-25 NOTE — ED Notes (Signed)
Per EMS, patient/family states general illness, not feeling well-increased anxiety, was given ativan at nursing facility, can only be given once a day-no relief

## 2012-07-26 LAB — URINALYSIS, ROUTINE W REFLEX MICROSCOPIC
Bilirubin Urine: NEGATIVE
Ketones, ur: NEGATIVE mg/dL
Nitrite: NEGATIVE
Protein, ur: NEGATIVE mg/dL
Specific Gravity, Urine: 1.007 (ref 1.005–1.030)
Urobilinogen, UA: 0.2 mg/dL (ref 0.0–1.0)

## 2012-07-26 LAB — URINE MICROSCOPIC-ADD ON

## 2012-07-26 MED ORDER — LORAZEPAM 2 MG/ML IJ SOLN
0.5000 mg | Freq: Once | INTRAMUSCULAR | Status: AC
  Administered 2012-07-26: 0.5 mg via INTRAVENOUS

## 2012-07-26 MED ORDER — IPRATROPIUM BROMIDE 0.02 % IN SOLN
0.5000 mg | Freq: Once | RESPIRATORY_TRACT | Status: AC
  Administered 2012-07-26: 0.5 mg via RESPIRATORY_TRACT

## 2012-07-26 MED ORDER — ALBUTEROL SULFATE (5 MG/ML) 0.5% IN NEBU
INHALATION_SOLUTION | RESPIRATORY_TRACT | Status: AC
  Administered 2012-07-26: 5 mg via RESPIRATORY_TRACT
  Filled 2012-07-26: qty 1

## 2012-07-26 MED ORDER — IPRATROPIUM BROMIDE 0.02 % IN SOLN
RESPIRATORY_TRACT | Status: AC
  Administered 2012-07-26: 0.5 mg via RESPIRATORY_TRACT
  Filled 2012-07-26: qty 2.5

## 2012-07-26 MED ORDER — MIRTAZAPINE 45 MG PO TABS: 45.0000 mg | ORAL_TABLET | Freq: Every day | ORAL | Status: DC

## 2012-07-26 MED ORDER — ALBUTEROL SULFATE (5 MG/ML) 0.5% IN NEBU
5.0000 mg | INHALATION_SOLUTION | Freq: Once | RESPIRATORY_TRACT | Status: AC
  Administered 2012-07-26: 5 mg via RESPIRATORY_TRACT

## 2012-07-26 NOTE — ED Notes (Signed)
PTR here.

## 2012-07-26 NOTE — ED Provider Notes (Signed)
Telepsych consult obtained, I spoke with Dr Aris Lot after consult who recommends increasing Remeron to 45mg  QHS. Family agreeable with plan discharge back to ALF.   Sunnie Nielsen, MD 07/26/12 [phone]

## 2012-07-26 NOTE — ED Notes (Signed)
While EMS transferring patient to EMS stretcher patient sustained a skin tear to left hand applied 2x2 bleeding self control.Informed EMS I would ask MD for steri-strip. MD agreed to  steri-strip. However, EMS already left when I was back in room.

## 2012-07-26 NOTE — ED Notes (Signed)
Awaiting for Tele Psych consult.

## 2012-07-26 NOTE — ED Notes (Signed)
Telepsych consultation information sent

## 2012-07-26 NOTE — ED Notes (Signed)
Awaiting for Tele Psych consult, MD notified, Charge Nurse aware.

## 2012-07-26 NOTE — ED Notes (Signed)
Follow up call done on PTR aware of transport.

## 2012-07-27 ENCOUNTER — Telehealth: Payer: Self-pay | Admitting: Internal Medicine

## 2012-07-27 NOTE — Telephone Encounter (Signed)
Caller: Brenda/Child; Phone: [phone]; Reason for Call: Daughter calling and states patient was seen in ED 1-25 due to depression and respiratory problems and is calling to inform MD as thought he may want to review chart from ED.  States medicine was increased and wants MD to be aware. Wants him to know she is dealing with depression "again" and that is medicine that was increased. Wants to know if MD thinks patient needs to come back to office to see him to discuss medicine change that was made. Patient is a resident at [facility] PLEASE CALL BRENDA AND ADVISE IF MD WANTS TO SEE PATIENT OR IF SHE NEEDS TO SEE PSYCH DOCTOR.

## 2012-07-27 NOTE — Telephone Encounter (Signed)
Onalee Hua, PT with [facility] called requesting a verbal order to continue working with pt. Requesting PT and OT for 2 x a week for 3 weeks. Re-certification is due at this time. Please advise.

## 2012-07-27 NOTE — Telephone Encounter (Signed)
1. OK to continue PT/OT 2. Had seen ED note: there was a telepscyh consult and the psychiatrist recommended increasing Remeron to 45 mg at bedtime. This seems reasonable to me. 3. If she doesn't seem to do better she can either see me or we can refer to a psychiatrist.

## 2012-07-27 NOTE — Telephone Encounter (Signed)
[facility] Triage Call Report Triage Record Num: [id number] Operator: Baltazar Apo Patient Name: [name] [name] Call Date & Time: 07/25/2012 5:39:59PM Patient Phone: [phone] PCP: [name] Patient Gender: Female PCP Fax : [phone] Patient DOB: 05-21-1917 Practice Name: [facility] Reason for Call: Caller: Brenda/Other; PCP: [name] (Adults only); CB#: [phone]; Call regarding In Assisted Living Facility; Now Nervous, Upset, Not Acting Herself and agitated, crying, Emergent symptoms of "New or worsening confusion, disorientation, or agitation" positive per Confusion, Disorientation, Agitation Protocol. Daughter will get her Mom to [facility] for evaluation. Protocol(s) Used: Confusion, Disorientation, Agitation Recommended Outcome per Protocol: See ED Immediately Reason for Outcome: New or worsening confusion, disorientation, or agitation Care Advice: ~ Do not leave patient alone. Write down provider's name. List or place the following in a bag for transport with the patient: current prescription and/or nonprescription medications; alternative treatments, therapies and medications; and street drugs. Tell provider if this individual has recently started, stopped or changed dose of a prescribed, nonprescribed or alternative medication.

## 2012-07-28 NOTE — Telephone Encounter (Signed)
LEFT MESSAGE ON DAUGHTER CELL # OF DR. Debby Bud RESPONSE OF PHONE CALL FOR PT AND OT. ALSO INFORMED OF DR. Debby Bud APPROVAL FOR INCREASE IN MEDICATION OF REMERON. IF NO IMPROVEMENT DR. Debby Bud WILL SEE HER OR REFERRAL TO PSYCHIATRIST.

## 2012-07-30 ENCOUNTER — Telehealth: Payer: Self-pay | Admitting: Internal Medicine

## 2012-07-30 ENCOUNTER — Telehealth: Payer: Self-pay | Admitting: *Deleted

## 2012-07-30 ENCOUNTER — Encounter: Payer: Self-pay | Admitting: *Deleted

## 2012-07-30 NOTE — Telephone Encounter (Signed)
Jodelle Red, Resident Care Coordinator with [facility] called requesting an order for pt to get a hospital bed. [phone]; fax#[phone]). Please advise.

## 2012-07-30 NOTE — Telephone Encounter (Signed)
Aurea Graff, OT with [facility], called stating that paperwork for discontinue of OT and PT will be coming to the office for Dr Debby Bud to sign. She states that if Dr Debby Bud thinks it is necessary to continue with OT and PT he would need to do new orders for a new certification for her insurance. Please advise.

## 2012-07-30 NOTE — Telephone Encounter (Signed)
Daughter -Lucile Shutters is calling concerning her mother -Gomm, Vinie .  SHE IS A RESIDENT AT [facility].  She states her mother is comparing her medication list with the Nursing home list. She has some concerns.  Her mother is suffering from a recent bout of depression and is very Nervous.  She noticed that her mothers medications have changed.   (1) Breathing treatments-  She shows Albuterol and Atrovent nebs TID .  Is her mother getting six treatments daily?  Explained that she may be giving combo/Duo nebs.  She is concerned about "so many breathers" making her nervouse.  These appears to be scheduled instead of "as needed" (2) Ativan was given at home 0.5mg  and 1mg  at night/bedtimes.  It appears that she is now only getting 0.5mg  daily.  (3) Why was Neurontin added?  Daughter wants to make sure this is not causing any issues.   SHE UNDERSTANDS DR. Debby Bud SO COMPLETELY WITH HER MOTHER AND SHE IS CONCERNED ABOUT MEDICATIONS CHANGES.  PLEASE CONTACT DAUGHTER [phone] OR cell phone  [phone]

## 2012-07-30 NOTE — Telephone Encounter (Signed)
Ok for hospital bed: for safety and to prevent skin breakdown by allowing for easier positioning.

## 2012-07-31 ENCOUNTER — Telehealth: Payer: Self-pay | Admitting: Internal Medicine

## 2012-07-31 NOTE — Telephone Encounter (Signed)
Misty Stanley from [facility] is calling to get new orders for this patient to have PT and OT, the old orders have expired, you can call her at [phone]

## 2012-07-31 NOTE — Telephone Encounter (Signed)
Letter for order for bed done.

## 2012-07-31 NOTE — Telephone Encounter (Signed)
Daughter Allene Pyo is calling back about her her questions regarding her mothers medications.  She spoke with staff yesterday 07/30/12.  Advised Dr. Debby Bud is not in the office today.  Please have Dr. Debby Bud review patient medications . (sse note dated 07/30/12.  Daughter concerned about Albuterol breathing treatments, Xanax and neurontin. Marland Kitchen

## 2012-07-31 NOTE — Telephone Encounter (Signed)
Signed order this AM and put in blue "to be faxed" envelope at [facility] desk

## 2012-08-08 ENCOUNTER — Other Ambulatory Visit: Payer: Self-pay

## 2012-08-08 ENCOUNTER — Emergency Department (HOSPITAL_COMMUNITY)
Admission: EM | Admit: 2012-08-08 | Discharge: 2012-08-08 | Disposition: A | Discharge status: Home / Self Care | Payer: Medicare Other | Attending: Emergency Medicine | Admitting: Emergency Medicine

## 2012-08-08 ENCOUNTER — Emergency Department (HOSPITAL_COMMUNITY): Payer: Medicare Other

## 2012-08-08 ENCOUNTER — Encounter (HOSPITAL_COMMUNITY): Payer: Self-pay | Admitting: Emergency Medicine

## 2012-08-08 DIAGNOSIS — R05 Cough: Secondary | ICD-10-CM | POA: Insufficient documentation

## 2012-08-08 DIAGNOSIS — M129 Arthropathy, unspecified: Secondary | ICD-10-CM | POA: Insufficient documentation

## 2012-08-08 DIAGNOSIS — F32A Depression, unspecified: Secondary | ICD-10-CM | POA: Insufficient documentation

## 2012-08-08 DIAGNOSIS — M5430 Sciatica, unspecified side: Secondary | ICD-10-CM | POA: Insufficient documentation

## 2012-08-08 DIAGNOSIS — R1310 Dysphagia, unspecified: Secondary | ICD-10-CM | POA: Insufficient documentation

## 2012-08-08 DIAGNOSIS — Z79899 Other long term (current) drug therapy: Secondary | ICD-10-CM | POA: Insufficient documentation

## 2012-08-08 DIAGNOSIS — E039 Hypothyroidism, unspecified: Secondary | ICD-10-CM | POA: Insufficient documentation

## 2012-08-08 DIAGNOSIS — Z87891 Personal history of nicotine dependence: Secondary | ICD-10-CM | POA: Insufficient documentation

## 2012-08-08 DIAGNOSIS — F411 Generalized anxiety disorder: Secondary | ICD-10-CM | POA: Insufficient documentation

## 2012-08-08 DIAGNOSIS — F323 Major depressive disorder, single episode, severe with psychotic features: Secondary | ICD-10-CM | POA: Insufficient documentation

## 2012-08-08 DIAGNOSIS — R609 Edema, unspecified: Secondary | ICD-10-CM | POA: Insufficient documentation

## 2012-08-08 DIAGNOSIS — R059 Cough, unspecified: Secondary | ICD-10-CM | POA: Insufficient documentation

## 2012-08-08 DIAGNOSIS — E538 Deficiency of other specified B group vitamins: Secondary | ICD-10-CM | POA: Insufficient documentation

## 2012-08-08 DIAGNOSIS — I4891 Unspecified atrial fibrillation: Secondary | ICD-10-CM | POA: Insufficient documentation

## 2012-08-08 DIAGNOSIS — I10 Essential (primary) hypertension: Secondary | ICD-10-CM | POA: Insufficient documentation

## 2012-08-08 DIAGNOSIS — F419 Anxiety disorder, unspecified: Secondary | ICD-10-CM

## 2012-08-08 DIAGNOSIS — K222 Esophageal obstruction: Secondary | ICD-10-CM | POA: Insufficient documentation

## 2012-08-08 DIAGNOSIS — R0602 Shortness of breath: Secondary | ICD-10-CM | POA: Insufficient documentation

## 2012-08-08 DIAGNOSIS — R0600 Dyspnea, unspecified: Secondary | ICD-10-CM

## 2012-08-08 DIAGNOSIS — K5732 Diverticulitis of large intestine without perforation or abscess without bleeding: Secondary | ICD-10-CM | POA: Insufficient documentation

## 2012-08-08 DIAGNOSIS — K269 Duodenal ulcer, unspecified as acute or chronic, without hemorrhage or perforation: Secondary | ICD-10-CM | POA: Insufficient documentation

## 2012-08-08 DIAGNOSIS — K5909 Other constipation: Secondary | ICD-10-CM | POA: Insufficient documentation

## 2012-08-08 DIAGNOSIS — I509 Heart failure, unspecified: Secondary | ICD-10-CM | POA: Insufficient documentation

## 2012-08-08 MED ORDER — LORAZEPAM 0.5 MG PO TABS
0.5000 mg | ORAL_TABLET | Freq: Once | ORAL | Status: AC
  Administered 2012-08-08: 0.5 mg via ORAL
  Filled 2012-08-08: qty 1

## 2012-08-08 NOTE — ED Notes (Signed)
Attempted to call [facility] at [phone], x [phone], to give report on pt going back to facility. No answer and end up in voice mail.

## 2012-08-08 NOTE — ED Notes (Signed)
ZOX:[id number]<AV> Expected date:<BR> Expected time:<BR> Means of arrival:<BR> Comments:<BR> SOB nursing home pt

## 2012-08-08 NOTE — ED Notes (Signed)
Per EMS pt comes from [facility], where she c/o ShOb times three days and has cough sometimes productive. pt states it's clear mucous.  Pt did receive breathing treatment at facility before EMS transported her to ED. Per facility staff, when pt gets "worked up", is when she gets [facility].

## 2012-08-08 NOTE — ED Provider Notes (Signed)
History     CSN: [id number]  Arrival date & time 08/08/12  0910   First MD Initiated Contact with Patient 08/08/12 [phone]      Chief Complaint  Patient presents with  . Shortness of Breath  . Cough    (Consider location/radiation/quality/duration/timing/severity/associated sxs/prior treatment) Patient is a 79 y.o. female presenting with shortness of breath and cough. The history is provided by the patient.  Shortness of Breath Associated symptoms: cough   Associated symptoms: no abdominal pain, no chest pain, no fever, no headaches, no neck pain and no rash   Cough Associated symptoms: shortness of breath   Associated symptoms: no chest pain, no chills, no fever, no headaches and no rash   pt from [facility]. Pt with non productive cough in the past 1-2 days. ?hx copd, uses neb tx prn. Pt states had one breathing tx so far today. Denies any current or recent chest pain or discomfort. Mild sob earlier, non currently. Denies sore throat, runny nose, body aches, fever or chills. Pt denies change in meds or new meds. Denies any unusual fatigue/gen weakness.     Past Medical History  Diagnosis Date  . Routine general medical examination at a health care facility   . Other B-complex deficiencies   . Unspecified hypothyroidism   . Diverticulitis of colon (without mention of hemorrhage)   . Congestive heart failure, unspecified   . Stricture and stenosis of esophagus   . Duodenal ulcer, unspecified as acute or chronic, without hemorrhage, perforation, or obstruction   . Atrial fibrillation   . Edema   . Dysphagia, unspecified   . Depressive type psychosis   . Sciatica   . Other constipation   . Unspecified essential hypertension   . Arthritis   . Gallstones   . PONV (postoperative nausea and vomiting)     Past Surgical History  Procedure Laterality Date  . Abdominal hysterectomy    . Cholecystectomy      Family History  Problem Relation Age of Onset  . Cancer Other     breast    . Heart disease Other     History  Substance Use Topics  . Smoking status: Never Smoker   . Smokeless tobacco: Former Neurosurgeon    Types: Snuff    Quit date: 01/13/1964  . Alcohol Use: No    OB History   Grav Para Term Preterm Abortions TAB SAB Ect Mult Living                  Review of Systems  Constitutional: Negative for fever and chills.  HENT: Negative for neck pain.   Eyes: Negative for redness.  Respiratory: Positive for cough and shortness of breath.   Cardiovascular: Negative for chest pain.  Gastrointestinal: Negative for abdominal pain.  Genitourinary: Negative for flank pain.  Musculoskeletal: Negative for back pain.  Skin: Negative for rash.  Neurological: Negative for headaches.  Hematological: Does not bruise/bleed easily.  Psychiatric/Behavioral: Negative for confusion.    Allergies  Prednisone and Sertraline hcl  Home Medications   Current Outpatient Rx  Name  Route  Sig  Dispense  Refill  . cloNIDine (CATAPRES) 0.1 MG tablet   Oral   Take 0.1 mg by mouth daily.          Marland Kitchen diltiazem (CARDIZEM CD) 120 MG 24 hr capsule   Oral   Take 120 mg by mouth every morning.          . docusate sodium (COLACE) 100 MG  capsule   Oral   Take 100 mg by mouth at bedtime.         . furosemide (LASIX) 40 MG tablet   Oral   Take 40 mg by mouth every morning.          . gabapentin (NEURONTIN) 100 MG capsule   Oral   Take 100 mg by mouth at bedtime.          Marland Kitchen ipratropium (ATROVENT) 0.02 % nebulizer solution   Nebulization   Take 2.5 mLs (0.5 mg total) by nebulization 3 (three) times daily.   75 mL   1   . levothyroxine (SYNTHROID, LEVOTHROID) 25 MCG tablet   Oral   Take 25 mcg by mouth every morning.         Marland Kitchen LORazepam (ATIVAN) 0.5 MG tablet   Oral   Take 0.5 mg by mouth daily as needed. For anxiety         . mirtazapine (REMERON) 45 MG tablet   Oral   Take 45 mg by mouth at bedtime.         Marland Kitchen omeprazole (PRILOSEC) 40 MG capsule    Oral   Take 40 mg by mouth 2 (two) times daily.         Marland Kitchen oxyCODONE-acetaminophen (PERCOCET/ROXICET) 5-325 MG per tablet   Oral   Take 1 tablet by mouth every 6 (six) hours as needed. For pain         . potassium chloride SA (K-DUR,KLOR-CON) 20 MEQ tablet   Oral   Take 20 mEq by mouth daily.         . SYMBICORT 80-4.5 MCG/ACT inhaler   Inhalation   Inhale 2 puffs into the lungs 2 (two) times daily.          . traZODone (DESYREL) 50 MG tablet   Oral   Take 25 mg by mouth at bedtime. Takes 1/2 tablet         . trimethoprim (TRIMPEX) 100 MG tablet   Oral   Take 100 mg by mouth daily.          Marland Kitchen albuterol (PROVENTIL) (2.5 MG/3ML) 0.083% nebulizer solution   Nebulization   Take 2.5 mg by nebulization every 6 (six) hours as needed. For shortness of breath           BP 154/46  Pulse 90  Temp(Src) 98.5 F (36.9 C) (Oral)  Resp 18  SpO2 99%  Physical Exam  Nursing note and vitals reviewed. Constitutional: She appears well-developed and well-nourished. No distress.  HENT:  Nose: Nose normal.  Mouth/Throat: Oropharynx is clear and moist.  Eyes: Conjunctivae are normal. No scleral icterus.  Neck: Neck supple. No JVD present. No tracheal deviation present.  Cardiovascular: Normal rate, regular rhythm, normal heart sounds and intact distal pulses.   Pulmonary/Chest: Effort normal and breath sounds normal. No respiratory distress.  Abdominal: Soft. Normal appearance and bowel sounds are normal. She exhibits no distension. There is no tenderness.  Musculoskeletal:  Mild bilateral lower leg edema. Skin tears to bilateral anterior lower legs.   Neurological: She is alert.  Skin: Skin is warm and dry. No rash noted.  Psychiatric:  Mildly anxious.     ED Course  Procedures (including critical care time)   Results for orders placed during the hospital encounter of 07/25/12  COMPREHENSIVE METABOLIC PANEL      Result Value Range   Sodium 132 (*) 135 - 145 mEq/L    Potassium 4.3  3.5 -  5.1 mEq/L   Chloride 97  96 - 112 mEq/L   CO2 27  19 - 32 mEq/L   Glucose, Bld 118 (*) 70 - 99 mg/dL   BUN 16  6 - 23 mg/dL   Creatinine, Ser 1.61 (*) 0.50 - 1.10 mg/dL   Calcium 8.4  8.4 - 09.6 mg/dL   Total Protein 6.0  6.0 - 8.3 g/dL   Albumin 2.8 (*) 3.5 - 5.2 g/dL   AST 14  0 - 37 U/L   ALT 9  0 - 35 U/L   Alkaline Phosphatase 83  39 - 117 U/L   Total Bilirubin 0.1 (*) 0.3 - 1.2 mg/dL   GFR calc non Af Amer 36 (*) >90 mL/min   GFR calc Af Amer 41 (*) >90 mL/min  CBC WITH DIFFERENTIAL      Result Value Range   WBC 5.5  4.0 - 10.5 K/uL   RBC 3.23 (*) 3.87 - 5.11 MIL/uL   Hemoglobin 9.6 (*) 12.0 - 15.0 g/dL   HCT 04.5 (*) 40.9 - 81.1 %   MCV 90.7  78.0 - 100.0 fL   MCH 29.7  26.0 - 34.0 pg   MCHC 32.8  30.0 - 36.0 g/dL   RDW 91.4  78.2 - 95.6 %   Platelets 252  150 - 400 K/uL   Neutrophils Relative 71  43 - 77 %   Neutro Abs 3.9  1.7 - 7.7 K/uL   Lymphocytes Relative 12  12 - 46 %   Lymphs Abs 0.7  0.7 - 4.0 K/uL   Monocytes Relative 16 (*) 3 - 12 %   Monocytes Absolute 0.9  0.1 - 1.0 K/uL   Eosinophils Relative 0  0 - 5 %   Eosinophils Absolute 0.0  0.0 - 0.7 K/uL   Basophils Relative 0  0 - 1 %   Basophils Absolute 0.0  0.0 - 0.1 K/uL  PRO B NATRIURETIC PEPTIDE      Result Value Range   Pro B Natriuretic peptide (BNP) 1916.0 (*) 0 - 450 pg/mL  URINALYSIS, ROUTINE W REFLEX MICROSCOPIC      Result Value Range   Color, Urine YELLOW  YELLOW   APPearance CLEAR  CLEAR   Specific Gravity, Urine 1.007  1.005 - 1.030   pH 7.5  5.0 - 8.0   Glucose, UA NEGATIVE  NEGATIVE mg/dL   Hgb urine dipstick LARGE (*) NEGATIVE   Bilirubin Urine NEGATIVE  NEGATIVE   Ketones, ur NEGATIVE  NEGATIVE mg/dL   Protein, ur NEGATIVE  NEGATIVE mg/dL   Urobilinogen, UA 0.2  0.0 - 1.0 mg/dL   Nitrite NEGATIVE  NEGATIVE   Leukocytes, UA NEGATIVE  NEGATIVE  LACTIC ACID, PLASMA      Result Value Range   Lactic Acid, Venous 0.9  0.5 - 2.2 mmol/L  TROPONIN I      Result  Value Range   Troponin I <0.30  <0.30 ng/mL  URINE MICROSCOPIC-ADD ON      Result Value Range   Squamous Epithelial / LPF RARE  RARE   WBC, UA 0-2  <3 WBC/hpf   RBC / HPF 0-2  <3 RBC/hpf   Dg Chest 2 View  08/08/2012  *RADIOLOGY REPORT*  Clinical Data: 79 year old female productive cough.  CHEST - 2 VIEW  Comparison: 07/25/2012 and earlier.  Findings: Semi upright AP and lateral views of the chest.  New small left pleural effusion with associated patchy opacity that most resembles atelectasis.  Right lung and  both upper lobes remain clear.  No pneumothorax or edema.  Cardiac size and mediastinal contours are within normal limits.  Visualized tracheal air column is within normal limits.  No acute osseous abnormality identified.  IMPRESSION: New small right pleural effusion with compressive atelectasis.   Original Report Authenticated By: Erskine Speed, M.D.    Dg Chest 2 View  07/25/2012  *RADIOLOGY REPORT*  Clinical Data: Shortness of breath.  Cough.  Anxiety.  CHEST - 2 VIEW  Comparison: 06/09/2012.  Findings: No infiltrate, congestive heart failure or pneumothorax. Mild central pulmonary vascular prominence with minimal peribronchial thickening unchanged.  Heart size within normal limits. Slightly tortuous aorta.  IMPRESSION: No acute abnormality.  Please see above.   Original Report Authenticated By: Lacy Duverney, M.D.       MDM  Cxr.   Date: 08/08/2012  Rate: 89  Rhythm: normal sinus rhythm  QRS Axis: normal  Intervals: normal  ST/T Wave abnormalities: normal  Conduction Disutrbances:none  Narrative Interpretation:   Old EKG Reviewed: unchanged  Recheck pt, breathing comfortably. No increased wob. No wheezing.   Pt had appears mildly anxious on arrival, hx same. Was given her normal dose, ativan .5 mg po.  Recheck pt remains calm and alert. No increased wob. Hr 82 rr 16, pulse ox 97% room air.  pts family arrived, states ms c/w baseline, they feel she likely had panic attack  earlier, they note hx same.  Prior notes reviewed, hx similar presentation in past.  pcp norins.   Pt appears ready/stable for d/c.         Suzi Roots, MD 08/08/12 1110

## 2012-08-10 ENCOUNTER — Telehealth: Payer: Self-pay | Admitting: Internal Medicine

## 2012-08-10 NOTE — Telephone Encounter (Signed)
Please read phone note below and advise. 

## 2012-08-10 NOTE — Telephone Encounter (Signed)
Patient Information:  Caller Name: Steward Drone  Phone: [phone]  Patient: [name] [name], [name] [name]  Gender: Female  DOB: 1916-12-15  Age: 79 Years  PCP: [name] (Adults only)  Office Follow Up:  Does the office need to follow up with this patient?: Yes  Instructions For The Office: Daughter states Dr. Debby Bud asked for an update. Please call back and advised if anxiety meds could be changed from prn to scheduled.   Symptoms  Reason For Call & Symptoms: Pt was hospitalized last week for anxiety. Pt is a resident of assisted living.  Daughter states that Dr. Debby Bud had asked her to call back if the anxiety continued. Daughter wants to point out to him that the pt is getting Lorazepam prn /not scheduled. Pt is in the wheelchair/not ambulating with the walker anymore. Daughter wants MD to know that the pt is getting more and more nervous and anxious with all the changes in the past 6 months. (and multiple moves). Daughter states the pt will not ask for anything - she feels the anxiety med needs to be routine so it is given at a certain time.  Reviewed Health History In EMR: N/A  Reviewed Medications In EMR: N/A  Reviewed Allergies In EMR: N/A  Reviewed Surgeries / Procedures: N/A  Date of Onset of Symptoms: 07/29/2012  Guideline(s) Used:  No Protocol Available - Sick Adult  Disposition Per Guideline:   Discuss with PCP and Callback by Nurse today  Reason For Disposition Reached:   Nursing judgment  Advice Given:  N/A

## 2012-08-11 NOTE — Telephone Encounter (Signed)
Lorazepam 0.5 mg bid on schedule. (not prn) AM and HS. Please remind me to prepare a written order to be faxed to facility. Thanks

## 2012-08-17 ENCOUNTER — Ambulatory Visit: Payer: Medicare Other | Admitting: Internal Medicine

## 2012-08-19 ENCOUNTER — Encounter: Payer: Self-pay | Admitting: Internal Medicine

## 2012-08-19 ENCOUNTER — Ambulatory Visit (INDEPENDENT_AMBULATORY_CARE_PROVIDER_SITE_OTHER): Payer: Medicare Other | Admitting: Internal Medicine

## 2012-08-19 VITALS — BP 124/58 | HR 81 | Temp 97.0°F | Resp 10 | Wt 118.0 lb

## 2012-08-19 DIAGNOSIS — I509 Heart failure, unspecified: Secondary | ICD-10-CM

## 2012-08-19 DIAGNOSIS — I10 Essential (primary) hypertension: Secondary | ICD-10-CM

## 2012-08-19 DIAGNOSIS — E039 Hypothyroidism, unspecified: Secondary | ICD-10-CM

## 2012-08-19 DIAGNOSIS — R0902 Hypoxemia: Secondary | ICD-10-CM | POA: Insufficient documentation

## 2012-08-19 DIAGNOSIS — M5430 Sciatica, unspecified side: Secondary | ICD-10-CM

## 2012-08-19 DIAGNOSIS — F323 Major depressive disorder, single episode, severe with psychotic features: Secondary | ICD-10-CM

## 2012-08-19 MED ORDER — BUDESONIDE-FORMOTEROL FUMARATE 160-4.5 MCG/ACT IN AERO: 2.0000 | INHALATION_SPRAY | Freq: Two times a day (BID) | RESPIRATORY_TRACT | Status: DC

## 2012-08-19 MED ORDER — LORAZEPAM 0.5 MG PO TABS: 0.5000 mg | ORAL_TABLET | Freq: Two times a day (BID) | ORAL | Status: DC

## 2012-08-19 NOTE — Assessment & Plan Note (Signed)
BP Readings from Last 3 Encounters:  08/19/12 124/58  08/08/12 156/48  07/26/12 140/54   OK control.  Continue present medications.

## 2012-08-19 NOTE — Assessment & Plan Note (Signed)
Continues to have pain. Not due for [facility]  Plan - ok for percocet every 6 hours as needed.

## 2012-08-19 NOTE — Assessment & Plan Note (Signed)
No evidence of fluid overload.  

## 2012-08-19 NOTE — Progress Notes (Signed)
Subjective:    Patient ID: [name] [name], female    DOB: 1917-06-22, 79 y.o.   MRN: [id number]  HPI Mrs. Shevchenko presents for follow up. She was recently in the ED for respiratory distress and anxiety Feb 8th - records reviewed: normal CXR, normal EKG, normal labs. She was sent home on increased anxiety medication.  She has been doing better: enjoys being out of her room. She still has frequent episodes of SOB requiring albuterol nebs.  Otherwise she has been doing pretty well for 89.  Past Medical History  Diagnosis Date  . Routine general medical examination at a health care facility   . Other B-complex deficiencies   . Unspecified hypothyroidism   . Diverticulitis of colon (without mention of hemorrhage)   . Congestive heart failure, unspecified   . Stricture and stenosis of esophagus   . Duodenal ulcer, unspecified as acute or chronic, without hemorrhage, perforation, or obstruction   . Atrial fibrillation   . Edema   . Dysphagia, unspecified   . Depressive type psychosis   . Sciatica   . Other constipation   . Unspecified essential hypertension   . Arthritis   . Gallstones   . PONV (postoperative nausea and vomiting)    Past Surgical History  Procedure Laterality Date  . Abdominal hysterectomy    . Cholecystectomy     Family History  Problem Relation Age of Onset  . Cancer Other     breast   . Heart disease Other    History   Social History  . Marital Status: Widowed    Spouse Name: N/A    Number of Children: N/A  . Years of Education: N/A   Occupational History  . Not on file.   Social History Main Topics  . Smoking status: Never Smoker   . Smokeless tobacco: Former Neurosurgeon    Types: Snuff    Quit date: 01/13/1964  . Alcohol Use: No  . Drug Use: No  . Sexually Active: No   Other Topics Concern  . Not on file   Social History Narrative   Widowed.   Lives with daughter, dependent for ADL's/ Home health coming in (9/09)   Discussed code  status: will not do CPR, or mechanical ventilation.    Current Outpatient Prescriptions on File Prior to Visit  Medication Sig Dispense Refill  . albuterol (PROVENTIL) (2.5 MG/3ML) 0.083% nebulizer solution Take 2.5 mg by nebulization every 6 (six) hours as needed. For shortness of breath      . cloNIDine (CATAPRES) 0.1 MG tablet Take 0.1 mg by mouth daily.       Marland Kitchen diltiazem (CARDIZEM CD) 120 MG 24 hr capsule Take 120 mg by mouth every morning.       . docusate sodium (COLACE) 100 MG capsule Take 100 mg by mouth at bedtime.      . furosemide (LASIX) 40 MG tablet Take 40 mg by mouth every morning.       . gabapentin (NEURONTIN) 100 MG capsule Take 100 mg by mouth at bedtime.       Marland Kitchen levothyroxine (SYNTHROID, LEVOTHROID) 25 MCG tablet Take 25 mcg by mouth every morning.      . mirtazapine (REMERON) 45 MG tablet Take 45 mg by mouth at bedtime.      Marland Kitchen omeprazole (PRILOSEC) 40 MG capsule Take 40 mg by mouth 2 (two) times daily.      Marland Kitchen oxyCODONE-acetaminophen (PERCOCET/ROXICET) 5-325 MG per tablet Take 1 tablet by mouth every 6 (  six) hours as needed. For pain      . potassium chloride SA (K-DUR,KLOR-CON) 20 MEQ tablet Take 20 mEq by mouth daily.      . traZODone (DESYREL) 50 MG tablet Take 25 mg by mouth at bedtime. Takes 1/2 tablet      . trimethoprim (TRIMPEX) 100 MG tablet Take 100 mg by mouth daily.        No current facility-administered medications on file prior to visit.      Review of Systems System review is negative for any constitutional, cardiac, pulmonary, GI or neuro symptoms or complaints other than as described in the HPI.     Objective:   Physical Exam Filed Vitals:   08/19/12 1355  BP: 124/58  Pulse: 81  Temp: 97 F (36.1 C)  Resp: 10   Wt Readings from Last 3 Encounters:  08/19/12 118 lb (53.524 kg)  07/22/12 116 lb 1 oz (52.644 kg)  06/10/12 108 lb 11 oz (49.3 kg)   Gen'l- WNWD white elderly woman in no distress, awake and alert. HEENT - C&S clear,  PRERRLA Cor - 2+ radial , RRR Pulm - no increased WOB, no wheeze no rales Ext - 2+ edema bilateral LE Neuro - A&O x 3, good sense of humor.       Assessment & Plan:

## 2012-08-19 NOTE — Assessment & Plan Note (Signed)
Doing well on present regimen, including lorazepam twice a day.  Plan - continue medications as listed.

## 2012-08-19 NOTE — Assessment & Plan Note (Signed)
Lab Results  Component Value Date   TSH 3.712 01/13/2012   Good control on present medication

## 2012-08-19 NOTE — Patient Instructions (Addendum)
Please see the accompanying note - for the record  There are several med changes - see list attached and in the note. She is to use symbicort twice a day and nebulizer treatments with albuterol every 6 hours as needed for shortness of breath.  Please try to encourage a latter bedtime so that she will sleep later. You can give PM medications at 2100 hours.   Return as needed or in 4 months.

## 2012-08-19 NOTE — Assessment & Plan Note (Signed)
Chronic hypoxemia which does respond to bronchodilators.  Plan Symbicort 160-/4.5 2 puffs twice a day  Albuterol 2.5 mg nebulizer treatment every 6 hours as needed  Note other nebulizers are stopped.

## 2012-08-21 DIAGNOSIS — Z0279 Encounter for issue of other medical certificate: Secondary | ICD-10-CM

## 2012-09-02 ENCOUNTER — Emergency Department (HOSPITAL_COMMUNITY)
Admission: EM | Admit: 2012-09-02 | Discharge: 2012-09-02 | Disposition: A | Discharge status: Skilled Nursing Facility | Payer: Medicare Other | Attending: Emergency Medicine | Admitting: Emergency Medicine

## 2012-09-02 ENCOUNTER — Encounter (HOSPITAL_COMMUNITY): Payer: Self-pay | Admitting: Emergency Medicine

## 2012-09-02 ENCOUNTER — Emergency Department (HOSPITAL_COMMUNITY): Payer: Medicare Other

## 2012-09-02 DIAGNOSIS — J40 Bronchitis, not specified as acute or chronic: Secondary | ICD-10-CM

## 2012-09-02 DIAGNOSIS — Z79899 Other long term (current) drug therapy: Secondary | ICD-10-CM | POA: Insufficient documentation

## 2012-09-02 DIAGNOSIS — L02419 Cutaneous abscess of limb, unspecified: Secondary | ICD-10-CM | POA: Insufficient documentation

## 2012-09-02 DIAGNOSIS — I509 Heart failure, unspecified: Secondary | ICD-10-CM | POA: Insufficient documentation

## 2012-09-02 DIAGNOSIS — R05 Cough: Secondary | ICD-10-CM | POA: Insufficient documentation

## 2012-09-02 DIAGNOSIS — F323 Major depressive disorder, single episode, severe with psychotic features: Secondary | ICD-10-CM | POA: Insufficient documentation

## 2012-09-02 DIAGNOSIS — Z862 Personal history of diseases of the blood and blood-forming organs and certain disorders involving the immune mechanism: Secondary | ICD-10-CM | POA: Insufficient documentation

## 2012-09-02 DIAGNOSIS — F32A Depression, unspecified: Secondary | ICD-10-CM | POA: Insufficient documentation

## 2012-09-02 DIAGNOSIS — R0602 Shortness of breath: Secondary | ICD-10-CM

## 2012-09-02 DIAGNOSIS — K5909 Other constipation: Secondary | ICD-10-CM | POA: Insufficient documentation

## 2012-09-02 DIAGNOSIS — L0390 Cellulitis, unspecified: Secondary | ICD-10-CM

## 2012-09-02 DIAGNOSIS — E039 Hypothyroidism, unspecified: Secondary | ICD-10-CM | POA: Insufficient documentation

## 2012-09-02 DIAGNOSIS — R0902 Hypoxemia: Secondary | ICD-10-CM

## 2012-09-02 DIAGNOSIS — Z8639 Personal history of other endocrine, nutritional and metabolic disease: Secondary | ICD-10-CM | POA: Insufficient documentation

## 2012-09-02 DIAGNOSIS — Z8739 Personal history of other diseases of the musculoskeletal system and connective tissue: Secondary | ICD-10-CM | POA: Insufficient documentation

## 2012-09-02 DIAGNOSIS — Z8719 Personal history of other diseases of the digestive system: Secondary | ICD-10-CM | POA: Insufficient documentation

## 2012-09-02 DIAGNOSIS — R059 Cough, unspecified: Secondary | ICD-10-CM | POA: Insufficient documentation

## 2012-09-02 DIAGNOSIS — M129 Arthropathy, unspecified: Secondary | ICD-10-CM | POA: Insufficient documentation

## 2012-09-02 DIAGNOSIS — IMO0002 Reserved for concepts with insufficient information to code with codable children: Secondary | ICD-10-CM | POA: Insufficient documentation

## 2012-09-02 DIAGNOSIS — Z792 Long term (current) use of antibiotics: Secondary | ICD-10-CM | POA: Insufficient documentation

## 2012-09-02 DIAGNOSIS — I4891 Unspecified atrial fibrillation: Secondary | ICD-10-CM | POA: Insufficient documentation

## 2012-09-02 DIAGNOSIS — R609 Edema, unspecified: Secondary | ICD-10-CM

## 2012-09-02 DIAGNOSIS — I10 Essential (primary) hypertension: Secondary | ICD-10-CM | POA: Insufficient documentation

## 2012-09-02 DIAGNOSIS — J209 Acute bronchitis, unspecified: Secondary | ICD-10-CM | POA: Insufficient documentation

## 2012-09-02 LAB — CBC
HCT: 29.4 % — ABNORMAL LOW (ref 36.0–46.0)
MCHC: 32.3 g/dL (ref 30.0–36.0)
MCV: 89.4 fL (ref 78.0–100.0)
Platelets: 239 10*3/uL (ref 150–400)
RDW: 14.8 % (ref 11.5–15.5)
WBC: 4.7 10*3/uL (ref 4.0–10.5)

## 2012-09-02 LAB — BASIC METABOLIC PANEL
BUN: 19 mg/dL (ref 6–23)
CO2: 25 mEq/L (ref 19–32)
Calcium: 8.2 mg/dL — ABNORMAL LOW (ref 8.4–10.5)
Creatinine, Ser: 1.16 mg/dL — ABNORMAL HIGH (ref 0.50–1.10)

## 2012-09-02 LAB — POCT I-STAT TROPONIN I

## 2012-09-02 MED ORDER — ALBUTEROL SULFATE (5 MG/ML) 0.5% IN NEBU
5.0000 mg | INHALATION_SOLUTION | Freq: Once | RESPIRATORY_TRACT | Status: AC
  Administered 2012-09-02: 5 mg via RESPIRATORY_TRACT
  Filled 2012-09-02: qty 1

## 2012-09-02 MED ORDER — DOXYCYCLINE HYCLATE 100 MG PO CAPS: 100.0000 mg | ORAL_CAPSULE | Freq: Two times a day (BID) | ORAL | Status: DC

## 2012-09-02 NOTE — ED Notes (Addendum)
Pt here via SOB x3 days that has  Progress ,as well as constant cough Iv started in route no meds given. Pt is from [facility] and has DNR papers

## 2012-09-02 NOTE — Progress Notes (Signed)
*  Preliminary Results* Right lower extremity venous duplex completed. Right lower extremity is negative for deep vein thrombosis. Unable to visualize the right posterior tibial and peroneal veins due to edema, however there is no obvious demonstration of deep vein thrombosis. No evidence of right Baker's cyst. Preliminary results discussed with Dr.Pollina.  09/02/2012 11:10 AM Gertie Fey, RDMS, RDCS

## 2012-09-02 NOTE — ED Provider Notes (Signed)
History     CSN: [id number]  Arrival date & time 09/02/12  4098   First MD Initiated Contact with Patient 09/02/12 1010      Chief Complaint  Patient presents with  . Shortness of Breath    (Consider location/radiation/quality/duration/timing/severity/associated sxs/prior treatment) HPI Comments: 79 yo female complains of shortness of breath and cough. Began 1-3 days ago. Symptoms have been worsening. Cough is dry and frequent. She has had shortness of breath associated with the cough. She has not had any chest pain or tightness. She has not had fever or chills. She has taken her nebulizer treatments at home with some benefit but not complete resolution of her symptoms. She cannot recall her past medical or surgical history and is unsure which medicines she takes at home. She is a resident of [facility]. She denies headache, chest pain, back pain, abdominal pain. SNF RN reports patient had increased WOB this AM and was sent.  Patient is a 79 y.o. female presenting with shortness of breath. The history is provided by the patient and the nursing home. No language interpreter was used.  Shortness of Breath Associated symptoms: cough   Associated symptoms: no abdominal pain, no chest pain, no fever, no headaches, no rash and no vomiting     Past Medical History  Diagnosis Date  . Routine general medical examination at a health care facility   . Other B-complex deficiencies   . Unspecified hypothyroidism   . Diverticulitis of colon (without mention of hemorrhage)   . Congestive heart failure, unspecified   . Stricture and stenosis of esophagus   . Duodenal ulcer, unspecified as acute or chronic, without hemorrhage, perforation, or obstruction   . Atrial fibrillation   . Edema   . Dysphagia, unspecified   . Depressive type psychosis   . Sciatica   . Other constipation   . Unspecified essential hypertension   . Arthritis   . Gallstones   . PONV (postoperative nausea and  vomiting)     Past Surgical History  Procedure Laterality Date  . Abdominal hysterectomy    . Cholecystectomy      Family History  Problem Relation Age of Onset  . Cancer Other     breast   . Heart disease Other     History  Substance Use Topics  . Smoking status: Never Smoker   . Smokeless tobacco: Former Neurosurgeon    Types: Snuff    Quit date: 01/13/1964  . Alcohol Use: No    OB History   Grav Para Term Preterm Abortions TAB SAB Ect Mult Living                  Review of Systems  Constitutional: Negative for fever and chills.  HENT: Negative for congestion and rhinorrhea.   Respiratory: Positive for cough and shortness of breath.   Cardiovascular: Negative for chest pain.  Gastrointestinal: Negative for nausea, vomiting and abdominal pain.  Endocrine: Negative for cold intolerance and heat intolerance.  Genitourinary: Negative for difficulty urinating.  Musculoskeletal: Negative for back pain.  Skin: Negative for rash and wound.  Neurological: Negative for syncope, weakness and headaches.  Hematological: Negative for adenopathy.  Psychiatric/Behavioral: Negative for behavioral problems.    Allergies  Prednisone and Sertraline hcl  Home Medications   Current Outpatient Rx  Name  Route  Sig  Dispense  Refill  . albuterol (PROVENTIL) (2.5 MG/3ML) 0.083% nebulizer solution   Nebulization   Take 2.5 mg by nebulization every 6 (six)  hours as needed. For shortness of breath         . budesonide-formoterol (SYMBICORT) 160-4.5 MCG/ACT inhaler   Inhalation   Inhale 2 puffs into the lungs 2 (two) times daily.   1 Inhaler   3   . cloNIDine (CATAPRES) 0.1 MG tablet   Oral   Take 0.1 mg by mouth daily.          Marland Kitchen diltiazem (CARDIZEM CD) 120 MG 24 hr capsule   Oral   Take 120 mg by mouth every morning.          . docusate sodium (COLACE) 100 MG capsule   Oral   Take 100 mg by mouth at bedtime.         . furosemide (LASIX) 40 MG tablet   Oral   Take  40 mg by mouth every morning.          . gabapentin (NEURONTIN) 100 MG capsule   Oral   Take 100 mg by mouth at bedtime.          . hydrocerin (EUCERIN) CREA   Topical   Apply 1 application topically 2 (two) times daily. Pt applies to feet         . ipratropium-albuterol (DUONEB) 0.5-2.5 (3) MG/3ML SOLN   Nebulization   Take 3 mLs by nebulization 3 (three) times daily as needed (wheezing).         Marland Kitchen levothyroxine (SYNTHROID, LEVOTHROID) 25 MCG tablet   Oral   Take 25 mcg by mouth every morning.         Marland Kitchen LORazepam (ATIVAN) 0.5 MG tablet   Oral   Take 1 tablet (0.5 mg total) by mouth 2 (two) times daily. For anxiety on schedule AM and PM   30 tablet      . mirtazapine (REMERON) 45 MG tablet   Oral   Take 45 mg by mouth at bedtime.         Marland Kitchen omeprazole (PRILOSEC) 40 MG capsule   Oral   Take 40 mg by mouth 2 (two) times daily.         Marland Kitchen oxyCODONE-acetaminophen (PERCOCET/ROXICET) 5-325 MG per tablet   Oral   Take 1 tablet by mouth every 6 (six) hours as needed. For pain         . potassium chloride SA (K-DUR,KLOR-CON) 20 MEQ tablet   Oral   Take 20 mEq by mouth daily.         . traZODone (DESYREL) 50 MG tablet   Oral   Take 25 mg by mouth at bedtime. Takes 1/2 tablet         . trimethoprim (TRIMPEX) 100 MG tablet   Oral   Take 100 mg by mouth daily.            SpO2 96%  Physical Exam  Constitutional: She appears well-developed and well-nourished. No distress.  NAD, well appearing elderly female.  HENT:  Head: Normocephalic and atraumatic.  Mouth/Throat: Oropharynx is clear and moist. No oropharyngeal exudate.  Eyes: Conjunctivae are normal. Pupils are equal, round, and reactive to light. Right eye exhibits no discharge. Left eye exhibits no discharge.  Neck: Neck supple. No JVD present.  Cardiovascular: Normal rate and regular rhythm.  Exam reveals no gallop and no friction rub.   No murmur heard. Pulmonary/Chest: No respiratory  distress. She has no wheezes. She has no rales.  Transmitted upper respiratory noises. No discrete wheezes and rales appreciated.  Abdominal: Soft. Bowel sounds are normal.  She exhibits no distension. There is no tenderness. There is no rebound and no guarding.  Musculoskeletal: She exhibits edema.  Right calf swollen and red.  Skin: She is not diaphoretic.    ED Course  Procedures (including critical care time)  Labs Reviewed  BASIC METABOLIC PANEL - Abnormal; Notable for the following:    Sodium 132 (*)    Glucose, Bld 139 (*)    Creatinine, Ser 1.16 (*)    Calcium 8.2 (*)    GFR calc non Af Amer 39 (*)    GFR calc Af Amer 45 (*)    All other components within normal limits  CBC - Abnormal; Notable for the following:    RBC 3.29 (*)    Hemoglobin 9.5 (*)    HCT 29.4 (*)    All other components within normal limits  PRO B NATRIURETIC PEPTIDE - Abnormal; Notable for the following:    Pro B Natriuretic peptide (BNP) 1531.0 (*)    All other components within normal limits  POCT I-STAT TROPONIN I   Dg Chest 2 View (if Patient Has Fever And/or Copd)  09/02/2012  *RADIOLOGY REPORT*  Clinical Data: Shortness of breath, history CHF, atrial fibrillation  CHEST - 2 VIEW  Comparison: 08/08/2012  Findings: Normal heart size, mediastinal contours, and pulmonary vascularity for technique. Atherosclerotic calcification aorta. Skin fold projects over right chest. Increased bronchitic changes. No definite infiltrate, pleural effusion, or pneumothorax. Diffuse osseous demineralization.  IMPRESSION: Increased bronchitic changes.   Original Report Authenticated By: Ulyses Southward, M.D.      Diagnosis: Leg Cellulitis    MDM  79 yo female presents from SNF for increased WOB. DDx: chronic obstructive lung disease, pneumonia, heart failure, ACS, pulmonary thromboembolic disease, upper respiratory infection, anemia. EKG, Duonebs, labs, lower extremity duplex, CXR. If negative workup will treat  symptomatically for combination of COPD/CHF.  EKG, CXR unremarkable. Labs around baseline. Patient with benign physical exam here and essentially normal vital signs. She is stable for discharge.  DVT study negative. Likely cellulitis. Will treat with Doxycycline to cover for pulmonary and skin infection.  Gilda Crease, MD 09/02/12 [id number]

## 2012-09-17 ENCOUNTER — Other Ambulatory Visit: Payer: Self-pay

## 2012-09-17 MED ORDER — LEVOTHYROXINE SODIUM 25 MCG PO TABS: 25.0000 ug | ORAL_TABLET | Freq: Every morning | ORAL | Status: AC

## 2012-09-17 MED ORDER — OMEPRAZOLE 40 MG PO CPDR: 40.0000 mg | DELAYED_RELEASE_CAPSULE | Freq: Two times a day (BID) | ORAL | Status: DC

## 2012-10-09 ENCOUNTER — Other Ambulatory Visit: Payer: Self-pay | Admitting: Internal Medicine

## 2012-10-09 MED ORDER — LORAZEPAM 0.5 MG PO TABS: 0.5000 mg | ORAL_TABLET | Freq: Two times a day (BID) | ORAL | Status: DC

## 2012-10-16 ENCOUNTER — Telehealth: Payer: Self-pay | Admitting: Internal Medicine

## 2012-10-16 NOTE — Telephone Encounter (Signed)
Daughter called back and states to call  [facility] [phone] Claim number [id number], she request return call as well

## 2012-10-16 NOTE — Telephone Encounter (Signed)
Pt daughter states insurance will not cover albuterol, request call back.

## 2012-10-20 NOTE — Telephone Encounter (Signed)
I called [employer] [phone] and was told they did not have a denial on file for pt regarding Albuterol. I spoke to [name]( daughter) and she had the letter of denial. I asked her what is the exact name and she could not say. I let her know I will keep an eye out for any paperwork regarding this.

## 2012-10-29 ENCOUNTER — Other Ambulatory Visit: Payer: Self-pay

## 2012-10-29 MED ORDER — OXYCODONE-ACETAMINOPHEN 5-325 MG PO TABS: 1.0000 | ORAL_TABLET | Freq: Four times a day (QID) | ORAL | Status: DC | PRN

## 2012-11-02 ENCOUNTER — Telehealth: Payer: Self-pay | Admitting: *Deleted

## 2012-11-02 NOTE — Telephone Encounter (Signed)
Yes, she should.

## 2012-11-02 NOTE — Telephone Encounter (Signed)
Pt's daughter informed of MD's advisement. Will contact [facility] in AM to set up appointment for pt-# [phone]

## 2012-11-02 NOTE — Telephone Encounter (Signed)
Pt's daughter calling to see if mother needs to come in for appointment-pt has been coughing and wheezing for a few days now-she is resident at [facility] have also sent a fax regarding pt asking for MD's advisement.

## 2012-11-03 ENCOUNTER — Other Ambulatory Visit: Payer: Self-pay

## 2012-11-03 MED ORDER — DILTIAZEM HCL ER COATED BEADS 120 MG PO CP24: 120.0000 mg | ORAL_CAPSULE | Freq: Every morning | ORAL | Status: AC

## 2012-11-03 MED ORDER — POTASSIUM CHLORIDE CRYS ER 20 MEQ PO TBCR: 20.0000 meq | EXTENDED_RELEASE_TABLET | Freq: Every day | ORAL | Status: DC

## 2012-11-03 NOTE — Telephone Encounter (Signed)
Appointment scheduled with [name] at [facility] for 11/04/2012 at 4:00pm with Dr. Jonny Ruiz.

## 2012-11-04 ENCOUNTER — Telehealth: Payer: Self-pay | Admitting: Internal Medicine

## 2012-11-04 ENCOUNTER — Ambulatory Visit (INDEPENDENT_AMBULATORY_CARE_PROVIDER_SITE_OTHER): Payer: Medicare Other | Admitting: Internal Medicine

## 2012-11-04 ENCOUNTER — Ambulatory Visit (INDEPENDENT_AMBULATORY_CARE_PROVIDER_SITE_OTHER)
Admission: RE | Admit: 2012-11-04 | Discharge: 2012-11-04 | Disposition: A | Discharge status: Home / Self Care | Payer: Medicare Other | Source: Ambulatory Visit | Attending: Internal Medicine | Admitting: Internal Medicine

## 2012-11-04 ENCOUNTER — Encounter: Payer: Self-pay | Admitting: Internal Medicine

## 2012-11-04 VITALS — BP 132/62 | HR 92 | Temp 98.3°F | Wt 115.8 lb

## 2012-11-04 DIAGNOSIS — J209 Acute bronchitis, unspecified: Secondary | ICD-10-CM | POA: Insufficient documentation

## 2012-11-04 DIAGNOSIS — I10 Essential (primary) hypertension: Secondary | ICD-10-CM

## 2012-11-04 DIAGNOSIS — I4891 Unspecified atrial fibrillation: Secondary | ICD-10-CM

## 2012-11-04 DIAGNOSIS — R062 Wheezing: Secondary | ICD-10-CM

## 2012-11-04 MED ORDER — HYDROCODONE-HOMATROPINE 5-1.5 MG/5ML PO SYRP: 5.0000 mL | ORAL_SOLUTION | Freq: Four times a day (QID) | ORAL | Status: DC | PRN

## 2012-11-04 MED ORDER — PREDNISONE 10 MG PO TABS: ORAL_TABLET | ORAL | Status: DC

## 2012-11-04 MED ORDER — METHYLPREDNISOLONE ACETATE 80 MG/ML IJ SUSP
80.0000 mg | Freq: Once | INTRAMUSCULAR | Status: AC
  Administered 2012-11-04: 80 mg via INTRAMUSCULAR

## 2012-11-04 MED ORDER — AZITHROMYCIN 250 MG PO TABS: ORAL_TABLET | ORAL | Status: DC

## 2012-11-04 MED ORDER — METHYLPREDNISOLONE 4 MG PO KIT: PACK | ORAL | Status: DC

## 2012-11-04 NOTE — Telephone Encounter (Signed)
cxr c/w small effusions, likely mild CHF recurrent  Ok to cont tx as per OV, but also increase the lasix to 40 bid for FIVE days ONLY, then resume 40 qam  Robin to inform pt and daughter, I have done handwritten order as pt is at her assisted living, requires such  Needs ROV with Dr Debby Bud next available

## 2012-11-04 NOTE — Patient Instructions (Signed)
You had the steroid shot today Please take all new medication as prescribed - the antibiotic, cough medicine, and small dose of prednisone Please continue all other medications as before Please have the pharmacy call with any other refills you may need. Please go to the XRAY Department in the Basement (go straight as you get off the elevator) for the x-ray testing You will be contacted by phone if any changes need to be made immediately.  Otherwise, you will receive a letter about your results with an explanation Please remember to sign up for My Chart if you have not done so, as this will be important to you in the future with finding out test results, communicating by private email, and scheduling acute appointments online when needed. Your form was filled out today for [facility]

## 2012-11-04 NOTE — Progress Notes (Signed)
Subjective:    Patient ID: [name] [name], female    DOB: [birth year], 79 y.o.   MRN: [id number]  HPI Here with family - Here with acute onset mild to mod 2-3 days ST, HA, general weakness and malaise, with prod cough greenish sputum, but Pt denies chest pain, increased sob or doe, wheezing, orthopnea, PND, increased LE swelling, palpitations, dizziness or syncope, except for onset mild wheezing onset last PM.  Past Medical History  Diagnosis Date  . Routine general medical examination at a health care facility   . Other B-complex deficiencies   . Unspecified hypothyroidism   . Diverticulitis of colon (without mention of hemorrhage)   . Congestive heart failure, unspecified   . Stricture and stenosis of esophagus   . Duodenal ulcer, unspecified as acute or chronic, without hemorrhage, perforation, or obstruction   . Atrial fibrillation   . Edema   . Dysphagia, unspecified   . Depressive type psychosis   . Sciatica   . Other constipation   . Unspecified essential hypertension   . Arthritis   . Gallstones   . PONV (postoperative nausea and vomiting)    Past Surgical History  Procedure Laterality Date  . Abdominal hysterectomy    . Cholecystectomy      reports that she has never smoked. She quit smokeless tobacco use about 48 years ago. Her smokeless tobacco use included Snuff. She reports that she does not drink alcohol or use illicit drugs. family history includes Cancer in her other and Heart disease in her other. Allergies  Allergen Reactions  . Prednisone Other (See Comments)    Delirium   . Sertraline Hcl Other (See Comments)    Delirium    Current Outpatient Prescriptions on File Prior to Visit  Medication Sig Dispense Refill  . albuterol (PROVENTIL) (2.5 MG/3ML) 0.083% nebulizer solution Take 2.5 mg by nebulization every 6 (six) hours as needed. For shortness of breath      . budesonide-formoterol (SYMBICORT) 160-4.5 MCG/ACT inhaler Inhale 2 puffs into the lungs 2  (two) times daily.  1 Inhaler  3  . cloNIDine (CATAPRES) 0.1 MG tablet Take 0.1 mg by mouth daily.       Marland Kitchen diltiazem (CARDIZEM CD) 120 MG 24 hr capsule Take 1 capsule (120 mg total) by mouth every morning.  30 capsule  6  . docusate sodium (COLACE) 100 MG capsule Take 100 mg by mouth at bedtime.      Marland Kitchen doxycycline (VIBRAMYCIN) 100 MG capsule Take 1 capsule (100 mg total) by mouth 2 (two) times daily. One po bid x 7 days  14 capsule  0  . furosemide (LASIX) 40 MG tablet Take 40 mg by mouth every morning.       . gabapentin (NEURONTIN) 100 MG capsule Take 100 mg by mouth at bedtime.       . hydrocerin (EUCERIN) CREA Apply 1 application topically 2 (two) times daily. Pt applies to feet      . ipratropium-albuterol (DUONEB) 0.5-2.5 (3) MG/3ML SOLN Take 3 mLs by nebulization 3 (three) times daily as needed (wheezing).      Marland Kitchen levothyroxine (SYNTHROID, LEVOTHROID) 25 MCG tablet Take 1 tablet (25 mcg total) by mouth every morning.  30 tablet  5  . LORazepam (ATIVAN) 0.5 MG tablet Take 1 tablet (0.5 mg total) by mouth 2 (two) times daily. For anxiety on schedule AM and PM  60 tablet  5  . mirtazapine (REMERON) 45 MG tablet Take 45 mg by mouth at bedtime.      Marland Kitchen  omeprazole (PRILOSEC) 40 MG capsule Take 1 capsule (40 mg total) by mouth 2 (two) times daily.  60 capsule  5  . oxyCODONE-acetaminophen (PERCOCET/ROXICET) 5-325 MG per tablet Take 1 tablet by mouth every 6 (six) hours as needed. For pain  60 tablet  0  . potassium chloride SA (K-DUR,KLOR-CON) 20 MEQ tablet Take 1 tablet (20 mEq total) by mouth daily.  30 tablet  6  . traZODone (DESYREL) 50 MG tablet Take 25 mg by mouth at bedtime. Takes 1/2 tablet      . trimethoprim (TRIMPEX) 100 MG tablet Take 100 mg by mouth daily.        No current facility-administered medications on file prior to visit.   Review of Systems  Constitutional: Negative for unexpected weight change, or unusual diaphoresis  HENT: Negative for tinnitus.   Eyes: Negative for  photophobia and visual disturbance.  Respiratory: Negative for choking and stridor.   Gastrointestinal: Negative for vomiting and blood in stool.  Genitourinary: Negative for hematuria and decreased urine volume.  Musculoskeletal: Negative for acute joint swelling Skin: Negative for color change and wound.  Neurological: Negative for tremors and numbness other than noted  Psychiatric/Behavioral: Negative for decreased concentration or  hyperactivity.       Objective:   Physical Exam BP 132/62  Pulse 92  Temp(Src) 98.3 F (36.8 C) (Oral)  Wt 115 lb 12 oz (52.504 kg)  BMI 22.61 kg/m2  SpO2 93% VS noted, mild ill Constitutional: Pt appears well-developed and well-nourished.  HENT: Head: NCAT.  Right Ear: External ear normal.  Left Ear: External ear normal.  Bilat tm's with mild erythema.  Max sinus areas mild tender.  Pharynx with mild erythema, no exudate Eyes: Conjunctivae and EOM are normal. Pupils are equal, round, and reactive to light.  Neck: Normal range of motion. Neck supple.  Cardiovascular: irregularly irreg  rhythm.   Pulmonary/Chest: Effort normal and breath sounds mild decresed  With few wheezes bilat.  Neurological: Pt is alert. Not confused  Skin: Skin is warm. No erythema.  Psychiatric: Pt behavior is normal. Thought content normal.     Assessment & Plan:

## 2012-11-05 NOTE — Telephone Encounter (Signed)
Called informed the daughter of results and MD instructions.  The family will pickup rx and take to nursing home today. Also put a copy of results instructions as nursing home will then schedule ROV with Dr. Debby Bud.

## 2012-11-08 NOTE — Assessment & Plan Note (Signed)
stable overall by history and exam, recent data reviewed with pt, and pt to continue medical treatment as before,  to f/u any worsening symptoms or concerns .lastbpe

## 2012-11-08 NOTE — Assessment & Plan Note (Signed)
Mild to mod, for antibx course,  to f/u any worsening symptoms or concerns 

## 2012-11-08 NOTE — Assessment & Plan Note (Signed)
Mild to mod, for predpack asd,  to f/u any worsening symptoms or concerns 

## 2012-11-08 NOTE — Assessment & Plan Note (Signed)
Stable volume and rate, Please continue all other medications as before

## 2012-11-09 ENCOUNTER — Telehealth: Payer: Self-pay | Admitting: Internal Medicine

## 2012-11-09 NOTE — Telephone Encounter (Signed)
[facility] Triage Call Report Triage Record Num: [id number] Operator: Valene Bors Patient Name: Taniya Dasher Call Date & Time: 11/07/2012 8:02:42AM Patient Phone: [phone] PCP: [name] Patient Gender: Female PCP Fax : [phone] Patient DOB: [birth year] Practice Name: [facility] Reason for Call: Caller: Vivia Budge at [facility]; PCP: [name] (Adults only); CB#: [phone]; Call regarding Orders- she started on Prednisone on 11/06/12 and has taken 4 doses so far along with Zpak. Prednisone is listed as one of her medication allergies. Reccieved IM dose in the office on 11/04/12. Needing to know if can continue or should hold med. Order written by by Oliver Barre on 11/04/12. She is doing fine. Cheeks are red. VSS - no cough or wheezing noted today -11/07/12. MD on call contacted, Dr. Caryl Never. Chart accessed via EPIC and allergy to Prednisone noted to cause Delirium. He advised for her to stay on the Medrol Pack and for Med Tech to call back with any sx of increased confusion or with any other concerns. PCP Calls- no triage. Protocol(s) Used: PCP Calls, No Triage (Adult) Recommended Outcome per Protocol: Call Provider Immediately Reason for Outcome: [1] Caller requests to speak ONLY to PCP AND [2] urgent question

## 2012-11-09 NOTE — Telephone Encounter (Signed)
Noted - agree with advise as per Dr Caryl Never thanks

## 2012-11-10 ENCOUNTER — Other Ambulatory Visit: Payer: Self-pay

## 2012-11-10 MED ORDER — CLONIDINE HCL 0.1 MG PO TABS: 0.1000 mg | ORAL_TABLET | Freq: Every day | ORAL | Status: AC

## 2012-11-18 ENCOUNTER — Telehealth: Payer: Self-pay

## 2012-11-18 NOTE — Telephone Encounter (Signed)
Pt 's daughter calls requesting to be placed on cancellation list. Currently appt is with Dr Debby Bud June 10. thanks

## 2012-11-24 ENCOUNTER — Other Ambulatory Visit (INDEPENDENT_AMBULATORY_CARE_PROVIDER_SITE_OTHER): Payer: Medicare Other

## 2012-11-24 ENCOUNTER — Encounter: Payer: Self-pay | Admitting: Internal Medicine

## 2012-11-24 ENCOUNTER — Other Ambulatory Visit: Payer: Self-pay | Admitting: Internal Medicine

## 2012-11-24 ENCOUNTER — Ambulatory Visit (INDEPENDENT_AMBULATORY_CARE_PROVIDER_SITE_OTHER): Payer: Medicare Other | Admitting: Internal Medicine

## 2012-11-24 VITALS — BP 108/54 | HR 83 | Temp 96.7°F | Ht 60.0 in | Wt 108.0 lb

## 2012-11-24 DIAGNOSIS — E039 Hypothyroidism, unspecified: Secondary | ICD-10-CM

## 2012-11-24 DIAGNOSIS — R4182 Altered mental status, unspecified: Secondary | ICD-10-CM

## 2012-11-24 DIAGNOSIS — R0602 Shortness of breath: Secondary | ICD-10-CM

## 2012-11-24 DIAGNOSIS — I10 Essential (primary) hypertension: Secondary | ICD-10-CM

## 2012-11-24 DIAGNOSIS — I509 Heart failure, unspecified: Secondary | ICD-10-CM

## 2012-11-24 DIAGNOSIS — J90 Pleural effusion, not elsewhere classified: Secondary | ICD-10-CM

## 2012-11-24 LAB — COMPREHENSIVE METABOLIC PANEL
ALT: 17 U/L (ref 0–35)
AST: 17 U/L (ref 0–37)
Albumin: 3.1 g/dL — ABNORMAL LOW (ref 3.5–5.2)
Alkaline Phosphatase: 109 U/L (ref 39–117)
Potassium: 5.4 mEq/L — ABNORMAL HIGH (ref 3.5–5.1)
Sodium: 129 mEq/L — ABNORMAL LOW (ref 135–145)
Total Bilirubin: 0.5 mg/dL (ref 0.3–1.2)
Total Protein: 6.7 g/dL (ref 6.0–8.3)

## 2012-11-24 LAB — CBC WITH DIFFERENTIAL/PLATELET
Basophils Relative: 0.4 % (ref 0.0–3.0)
Eosinophils Absolute: 0 10*3/uL (ref 0.0–0.7)
Eosinophils Relative: 0.4 % (ref 0.0–5.0)
Hemoglobin: 11.1 g/dL — ABNORMAL LOW (ref 12.0–15.0)
Lymphocytes Relative: 9.2 % — ABNORMAL LOW (ref 12.0–46.0)
MCHC: 33.5 g/dL (ref 30.0–36.0)
MCV: 85.5 fl (ref 78.0–100.0)
Neutro Abs: 7.8 10*3/uL — ABNORMAL HIGH (ref 1.4–7.7)
RBC: 3.89 Mil/uL (ref 3.87–5.11)
WBC: 9.7 10*3/uL (ref 4.5–10.5)

## 2012-11-24 NOTE — Patient Instructions (Addendum)
Will work up for infection and heart failure in regard to increased somnolence and confusion. See full note.

## 2012-11-24 NOTE — Assessment & Plan Note (Signed)
Somnolence.  Lab Results  Component Value Date   TSH 3.712 01/13/2012   Plan  Lab: TSH

## 2012-11-24 NOTE — Assessment & Plan Note (Signed)
Persistent cough and mild SOB. CXR reviewed from May 7th  Plan - BNP

## 2012-11-24 NOTE — Progress Notes (Signed)
Subjective:    Patient ID: [name] [name], female    DOB: 06-07-1917, 79 y.o.   MRN: [id number]  HPI [name] [name] presents for a mental status change over the past several days: increase somnolence and increased confusion. Her daughter sees her almost every day and has noticed the changes. She does take suppressive dose of septra and she takes many psychotropic drugs. She voices no specfic complaints.   She was seen May 7th by Dr. Jonny Ruiz, diagnosed with bronchitis and treated with Z-pak, Medrol and hycodan. CXR with pleural effusions but no infiltrative disease, no pulmonary edema.  Current Outpatient Prescriptions on File Prior to Visit  Medication Sig Dispense Refill  . albuterol (PROVENTIL) (2.5 MG/3ML) 0.083% nebulizer solution Take 2.5 mg by nebulization every 6 (six) hours as needed. For shortness of breath      . azithromycin (ZITHROMAX Z-PAK) 250 MG tablet Use as directed  6 each  1  . budesonide-formoterol (SYMBICORT) 160-4.5 MCG/ACT inhaler Inhale 2 puffs into the lungs 2 (two) times daily.  1 Inhaler  3  . cloNIDine (CATAPRES) 0.1 MG tablet Take 1 tablet (0.1 mg total) by mouth daily.  30 tablet  5  . diltiazem (CARDIZEM CD) 120 MG 24 hr capsule Take 1 capsule (120 mg total) by mouth every morning.  30 capsule  6  . docusate sodium (COLACE) 100 MG capsule Take 100 mg by mouth at bedtime.      Marland Kitchen doxycycline (VIBRAMYCIN) 100 MG capsule Take 1 capsule (100 mg total) by mouth 2 (two) times daily. One po bid x 7 days  14 capsule  0  . furosemide (LASIX) 40 MG tablet Take 40 mg by mouth every morning.       . gabapentin (NEURONTIN) 100 MG capsule Take 100 mg by mouth at bedtime.       . hydrocerin (EUCERIN) CREA Apply 1 application topically 2 (two) times daily. Pt applies to feet      . HYDROcodone-homatropine (HYCODAN) 5-1.5 MG/5ML syrup Take 5 mLs by mouth every 6 (six) hours as needed for cough.  120 mL  1  . ipratropium-albuterol (DUONEB) 0.5-2.5 (3) MG/3ML SOLN Take 3 mLs by nebulization  3 (three) times daily as needed (wheezing).      Marland Kitchen levothyroxine (SYNTHROID, LEVOTHROID) 25 MCG tablet Take 1 tablet (25 mcg total) by mouth every morning.  30 tablet  5  . LORazepam (ATIVAN) 0.5 MG tablet Take 1 tablet (0.5 mg total) by mouth 2 (two) times daily. For anxiety on schedule AM and PM  60 tablet  5  . methylPREDNISolone (MEDROL, PAK,) 4 MG tablet follow package directions  21 tablet  0  . mirtazapine (REMERON) 45 MG tablet Take 45 mg by mouth at bedtime.      Marland Kitchen omeprazole (PRILOSEC) 40 MG capsule Take 1 capsule (40 mg total) by mouth 2 (two) times daily.  60 capsule  5  . oxyCODONE-acetaminophen (PERCOCET/ROXICET) 5-325 MG per tablet Take 1 tablet by mouth every 6 (six) hours as needed. For pain  60 tablet  0  . potassium chloride SA (K-DUR,KLOR-CON) 20 MEQ tablet Take 1 tablet (20 mEq total) by mouth daily.  30 tablet  6  . traZODone (DESYREL) 50 MG tablet Take 25 mg by mouth at bedtime. Takes 1/2 tablet      . trimethoprim (TRIMPEX) 100 MG tablet Take 100 mg by mouth daily.        No current facility-administered medications on file prior to visit.   Past Medical  History  Diagnosis Date  . Routine general medical examination at a health care facility   . Other B-complex deficiencies   . Unspecified hypothyroidism   . Diverticulitis of colon (without mention of hemorrhage)   . Congestive heart failure, unspecified   . Stricture and stenosis of esophagus   . Duodenal ulcer, unspecified as acute or chronic, without hemorrhage, perforation, or obstruction   . Atrial fibrillation   . Edema   . Dysphagia, unspecified   . Depressive type psychosis   . Sciatica   . Other constipation   . Unspecified essential hypertension   . Arthritis   . Gallstones   . PONV (postoperative nausea and vomiting)    Past Surgical History  Procedure Laterality Date  . Abdominal hysterectomy    . Cholecystectomy     Family History  Problem Relation Age of Onset  . Cancer Other     breast    . Heart disease Other    History   Social History  . Marital Status: Widowed    Spouse Name: N/A    Number of Children: N/A  . Years of Education: N/A   Occupational History  . Not on file.   Social History Main Topics  . Smoking status: Never Smoker   . Smokeless tobacco: Former Neurosurgeon    Types: Snuff    Quit date: 01/13/1964  . Alcohol Use: No  . Drug Use: No  . Sexually Active: No   Other Topics Concern  . Not on file   Social History Narrative   Widowed.   Lives with daughter, dependent for ADL's/ Home health coming in (9/09)   Discussed code status: will not do CPR, or mechanical ventilation.       Review of Systems System review is negative for any constitutional, cardiac, pulmonary, GI or neuro symptoms or complaints other than as described in the HPI.     Objective:   Physical Exam Filed Vitals:   11/24/12 1131  BP: 108/54  Pulse: 83  Temp: 96.7 F (35.9 C)   Wt Readings from Last 3 Encounters:  11/24/12 108 lb (48.988 kg)  11/04/12 115 lb 12 oz (52.504 kg)  08/19/12 118 lb (53.524 kg)   General - elderly white woman in no acute distress HEENT - temporal wasting Neck - supple Cor - 2+ radial pulse, rate controlled Pulm - no increased WOB, no wheezing or rales, good breath sounds Neuro - Alert and at her baseline.       Assessment & Plan:  1. Somnolence and mental status change - concerned for possible infection in the elderly, most likely to be UTI.  Plan - lab: U/A, CBCD

## 2012-11-24 NOTE — Addendum Note (Signed)
Addended by: Lyanne Co R on: 11/24/2012 01:03 PM   Modules accepted: Orders

## 2012-11-24 NOTE — Assessment & Plan Note (Signed)
BP Readings from Last 3 Encounters:  11/24/12 108/54  11/04/12 132/62  09/02/12 140/54   OK on present medication.

## 2012-11-25 ENCOUNTER — Ambulatory Visit: Payer: Medicare Other

## 2012-11-25 ENCOUNTER — Telehealth: Payer: Self-pay

## 2012-11-25 DIAGNOSIS — R0602 Shortness of breath: Secondary | ICD-10-CM

## 2012-11-25 LAB — URINALYSIS, ROUTINE W REFLEX MICROSCOPIC
Ketones, ur: NEGATIVE
Specific Gravity, Urine: 1.015 (ref 1.000–1.030)
Urine Glucose: NEGATIVE
Urobilinogen, UA: 0.2 (ref 0.0–1.0)

## 2012-11-25 NOTE — Telephone Encounter (Signed)
Yes, send it out please

## 2012-11-25 NOTE — Telephone Encounter (Signed)
[name] [name] with our lab calls and states pt's BMP is not able to be tested until Friday due to the callibrator being on back order. This can be sent to [facility] instead of waiting. Do you want to do this?

## 2012-11-25 NOTE — Telephone Encounter (Signed)
Spoke to [facility] in the lab and let her know to go ahead and send out patient's blood to [facility]. She said okay.

## 2012-11-27 ENCOUNTER — Telehealth: Payer: Self-pay

## 2012-11-27 NOTE — Telephone Encounter (Signed)
Letter to follow: chemistries were normal except for mildly low sodium at 129, kidney function normal, white blood count and hemoglobin normal, BNP normal, U/A negative. Thanks

## 2012-11-27 NOTE — Telephone Encounter (Signed)
Called Steward Drone - we can increase ativan. No other treatment.

## 2012-11-27 NOTE — Telephone Encounter (Signed)
Phone call from patients daughter, Lucile Shutters [phone], requesting lab results. Please advise.

## 2012-11-27 NOTE — Telephone Encounter (Signed)
Phone call to Lucile Shutters letting her know of her mothers lab results. Steward Drone states her mother is still wheezing and coughing. She's very confused and yelling out for help. She is also sleeping a lot. Steward Drone states she does not know what to do for her. Please advise.

## 2012-11-28 ENCOUNTER — Encounter (HOSPITAL_COMMUNITY): Payer: Self-pay | Admitting: *Deleted

## 2012-11-28 ENCOUNTER — Inpatient Hospital Stay (HOSPITAL_COMMUNITY)
Admission: EM | Admit: 2012-11-28 | Discharge: 2012-12-04 | DRG: 292 | Disposition: A | Discharge status: Skilled Nursing Facility | Payer: Medicare Other | Attending: Internal Medicine | Admitting: Internal Medicine

## 2012-11-28 ENCOUNTER — Emergency Department (HOSPITAL_COMMUNITY): Payer: Medicare Other

## 2012-11-28 DIAGNOSIS — J4489 Other specified chronic obstructive pulmonary disease: Secondary | ICD-10-CM | POA: Diagnosis present

## 2012-11-28 DIAGNOSIS — Z66 Do not resuscitate: Secondary | ICD-10-CM | POA: Diagnosis present

## 2012-11-28 DIAGNOSIS — R062 Wheezing: Secondary | ICD-10-CM

## 2012-11-28 DIAGNOSIS — R5381 Other malaise: Secondary | ICD-10-CM | POA: Diagnosis present

## 2012-11-28 DIAGNOSIS — I129 Hypertensive chronic kidney disease with stage 1 through stage 4 chronic kidney disease, or unspecified chronic kidney disease: Secondary | ICD-10-CM | POA: Diagnosis present

## 2012-11-28 DIAGNOSIS — N183 Chronic kidney disease, stage 3 unspecified: Secondary | ICD-10-CM | POA: Diagnosis present

## 2012-11-28 DIAGNOSIS — I5033 Acute on chronic diastolic (congestive) heart failure: Principal | ICD-10-CM | POA: Diagnosis present

## 2012-11-28 DIAGNOSIS — N182 Chronic kidney disease, stage 2 (mild): Secondary | ICD-10-CM

## 2012-11-28 DIAGNOSIS — J449 Chronic obstructive pulmonary disease, unspecified: Secondary | ICD-10-CM | POA: Diagnosis present

## 2012-11-28 DIAGNOSIS — I10 Essential (primary) hypertension: Secondary | ICD-10-CM

## 2012-11-28 DIAGNOSIS — J441 Chronic obstructive pulmonary disease with (acute) exacerbation: Secondary | ICD-10-CM

## 2012-11-28 DIAGNOSIS — R0902 Hypoxemia: Secondary | ICD-10-CM | POA: Diagnosis present

## 2012-11-28 DIAGNOSIS — R5383 Other fatigue: Secondary | ICD-10-CM | POA: Diagnosis present

## 2012-11-28 DIAGNOSIS — E039 Hypothyroidism, unspecified: Secondary | ICD-10-CM | POA: Diagnosis present

## 2012-11-28 DIAGNOSIS — E871 Hypo-osmolality and hyponatremia: Secondary | ICD-10-CM | POA: Diagnosis present

## 2012-11-28 DIAGNOSIS — I509 Heart failure, unspecified: Secondary | ICD-10-CM | POA: Diagnosis present

## 2012-11-28 DIAGNOSIS — I4891 Unspecified atrial fibrillation: Secondary | ICD-10-CM | POA: Diagnosis present

## 2012-11-28 DIAGNOSIS — N189 Chronic kidney disease, unspecified: Secondary | ICD-10-CM | POA: Diagnosis present

## 2012-11-28 LAB — BASIC METABOLIC PANEL
BUN: 16 mg/dL (ref 6–23)
CO2: 28 mEq/L (ref 19–32)
Calcium: 8.7 mg/dL (ref 8.4–10.5)
Chloride: 92 mEq/L — ABNORMAL LOW (ref 96–112)
Creatinine, Ser: 1.12 mg/dL — ABNORMAL HIGH (ref 0.50–1.10)
GFR calc Af Amer: 47 mL/min — ABNORMAL LOW (ref >90)
GFR calc non Af Amer: 41 mL/min — ABNORMAL LOW (ref >90)
Glucose, Bld: 163 mg/dL — ABNORMAL HIGH (ref 70–99)
Potassium: 4.4 mEq/L (ref 3.5–5.1)
Sodium: 129 mEq/L — ABNORMAL LOW (ref 135–145)

## 2012-11-28 LAB — URINALYSIS, ROUTINE W REFLEX MICROSCOPIC
Bilirubin Urine: NEGATIVE
Glucose, UA: NEGATIVE mg/dL
Hgb urine dipstick: NEGATIVE
Ketones, ur: NEGATIVE mg/dL
Leukocytes, UA: NEGATIVE
Nitrite: NEGATIVE
Protein, ur: NEGATIVE mg/dL
Specific Gravity, Urine: 1.011 (ref 1.005–1.030)
Urobilinogen, UA: 0.2 mg/dL (ref 0.0–1.0)
pH: 7 (ref 5.0–8.0)

## 2012-11-28 LAB — CBC
HCT: 30.3 % — ABNORMAL LOW (ref 36.0–46.0)
Hemoglobin: 10.1 g/dL — ABNORMAL LOW (ref 12.0–15.0)
MCH: 28.1 pg (ref 26.0–34.0)
MCHC: 33.3 g/dL (ref 30.0–36.0)
MCV: 84.4 fL (ref 78.0–100.0)
Platelets: 375 10*3/uL (ref 150–400)
RBC: 3.59 MIL/uL — ABNORMAL LOW (ref 3.87–5.11)
RDW: 14.9 % (ref 11.5–15.5)
WBC: 7.8 10*3/uL (ref 4.0–10.5)

## 2012-11-28 LAB — PRO B NATRIURETIC PEPTIDE: Pro B Natriuretic peptide (BNP): 1380 pg/mL — ABNORMAL HIGH (ref 0–450)

## 2012-11-28 MED ORDER — FUROSEMIDE 10 MG/ML IJ SOLN
80.0000 mg | Freq: Once | INTRAMUSCULAR | Status: AC
  Administered 2012-11-28: 80 mg via INTRAVENOUS
  Filled 2012-11-28: qty 8

## 2012-11-28 MED ORDER — ALBUTEROL SULFATE (5 MG/ML) 0.5% IN NEBU
2.5000 mg | INHALATION_SOLUTION | RESPIRATORY_TRACT | Status: DC
  Administered 2012-11-29 (×6): 2.5 mg via RESPIRATORY_TRACT
  Filled 2012-11-28 (×6): qty 0.5

## 2012-11-28 MED ORDER — IPRATROPIUM BROMIDE 0.02 % IN SOLN
0.5000 mg | RESPIRATORY_TRACT | Status: DC
  Administered 2012-11-29 (×6): 0.5 mg via RESPIRATORY_TRACT
  Filled 2012-11-28 (×4): qty 2.5

## 2012-11-28 NOTE — ED Notes (Signed)
MD aware of pt b/p before lasix given.

## 2012-11-28 NOTE — ED Provider Notes (Signed)
Shelda Jakes, MD   Medical screening examination/treatment/procedure(s) were conducted as a shared visit with non-physician practitioner(s) and myself.  I personally evaluated the patient during the encounter   Patient seen by me. The patient lives in assisted living workup here today shows congestive heart failure patient was referred in for shortness of breath normally gets the role type treatments day or 3 times a day they noticed that she was not improving after that so she was referred in. No wheezing here does have bilateral rales does have a left effusion symptoms definitely seem to be consistent with CHF troponin is negative.  Patient oxygen levels on.2 L of oxygen is around 94-95% sats. Overlies is down to about 89%. Patient denies any chest pain. As stated troponin was negative. EKG without acute changes history of atrial fib and nonatrophic currently.  Will contact the hospitalist about admission.  Shelda Jakes, MD 11/28/12 2112

## 2012-11-28 NOTE — H&P (Signed)
PCP:  [name], MD    Chief Complaint:  Shortness of breath  HPI: [name] [name] is a 79 y.o. female   has a past medical history of Routine general medical examination at a health care facility; Other B-complex deficiencies; Unspecified hypothyroidism; Diverticulitis of colon (without mention of hemorrhage); Congestive heart failure, unspecified; Stricture and stenosis of esophagus; Duodenal ulcer, unspecified as acute or chronic, without hemorrhage, perforation, or obstruction; Atrial fibrillation; Edema; Dysphagia, unspecified(787.20); Depressive type psychosis; Sciatica; Other constipation; Unspecified essential hypertension; Arthritis; Gallstones; and PONV (postoperative nausea and vomiting).   Presented with  Patient lives at [facility] assistance [facility]. She was having worsening shortness of breath despite being treated with inhalers. Patient was brought in to ER and was found to be in CHF exacerbation. She has known hx of CHF with preserved Ef per echo in 2013. She also has hx of A.fib but not anticoagulated due to hx of frequent falls.  She denies any chest pain but does endorse some leg swelling. In ED she got lasix 80 mg IV and have diuresed some. She is not on O2 at baseline but required 2 L since her presentation to ER. Patient reports feeling better now.  3 weeks ago she had a case of bronchitis and was treated with z-pack. She has hx of recurrent UTI. Last time when patient was evaluated by Dr. Arthur Holms she was found to be hyponatremic.   Hospitalist called for an admission.   Review of Systems:    Pertinent positives include: shortness of breath at rest.   dyspnea on exertion,  Bilateral lower extremity swelling   Constitutional:  No weight loss, night sweats, Fevers, chills, fatigue, weight loss  HEENT:  No headaches, Difficulty swallowing,Tooth/dental problems,Sore throat,  No sneezing, itching, ear ache, nasal congestion, post nasal drip,  Cardio-vascular:   No chest pain, Orthopnea, PND, anasarca, dizziness, palpitations. GI:  No heartburn, indigestion, abdominal pain, nausea, vomiting, diarrhea, change in bowel habits, loss of appetite, melena, blood in stool, hematemesis Resp:  noNo excess mucus, no productive cough, No non-productive cough, No coughing up of blood.No change in color of mucus.No wheezing. Skin:  no rash or lesions. No jaundice GU:  no dysuria, change in color of urine, no urgency or frequency. No straining to urinate.  No flank pain.  Musculoskeletal:  No joint pain or no joint swelling. No decreased range of motion. No back pain.  Psych:  No change in mood or affect. No depression or anxiety. No memory loss.  Neuro: no localizing neurological complaints, no tingling, no weakness, no double vision, no gait abnormality, no slurred speech, no confusion  Otherwise ROS are negative except for above, 10 systems were reviewed  Past Medical History: Past Medical History  Diagnosis Date  . Routine general medical examination at a health care facility   . Other B-complex deficiencies   . Unspecified hypothyroidism   . Diverticulitis of colon (without mention of hemorrhage)   . Congestive heart failure, unspecified   . Stricture and stenosis of esophagus   . Duodenal ulcer, unspecified as acute or chronic, without hemorrhage, perforation, or obstruction   . Atrial fibrillation   . Edema   . Dysphagia, unspecified(787.20)   . Depressive type psychosis   . Sciatica   . Other constipation   . Unspecified essential hypertension   . Arthritis   . Gallstones   . PONV (postoperative nausea and vomiting)    Past Surgical History  Procedure Laterality Date  . Abdominal hysterectomy    .  Cholecystectomy       Medications: Prior to Admission medications   Medication Sig Start Date End Date Taking? Authorizing Provider  albuterol (PROVENTIL) (2.5 MG/3ML) 0.083% nebulizer solution Take 2.5 mg by nebulization every 6 (six)  hours as needed. For shortness of breath   Yes Historical Provider, MD  budesonide-formoterol (SYMBICORT) 80-4.5 MCG/ACT inhaler Inhale 2 puffs into the lungs 2 (two) times daily.   Yes Historical Provider, MD  cloNIDine (CATAPRES) 0.1 MG tablet Take 1 tablet (0.1 mg total) by mouth daily. 11/10/12  Yes Jacques Navy, MD  diltiazem (CARDIZEM CD) 120 MG 24 hr capsule Take 1 capsule (120 mg total) by mouth every morning. 11/03/12  Yes Jacques Navy, MD  docusate sodium (COLACE) 100 MG capsule Take 100 mg by mouth at bedtime.   Yes Historical Provider, MD  furosemide (LASIX) 40 MG tablet Take 40 mg by mouth every morning.  10/04/11  Yes Jacques Navy, MD  gabapentin (NEURONTIN) 100 MG capsule Take 100 mg by mouth at bedtime.    Yes Historical Provider, MD  hydrocerin (EUCERIN) CREA Apply 1 application topically 2 (two) times daily. Pt applies to feet   Yes Historical Provider, MD  HYDROcodone-homatropine (HYCODAN) 5-1.5 MG/5ML syrup Take 5 mLs by mouth every 6 (six) hours as needed for cough. 11/04/12  Yes Corwin Levins, MD  ipratropium-albuterol (DUONEB) 0.5-2.5 (3) MG/3ML SOLN Take 3 mLs by nebulization 3 (three) times daily as needed (wheezing).   Yes Historical Provider, MD  levothyroxine (SYNTHROID, LEVOTHROID) 25 MCG tablet Take 1 tablet (25 mcg total) by mouth every morning. 09/17/12  Yes Jacques Navy, MD  LORazepam (ATIVAN) 0.5 MG tablet Take 1 tablet (0.5 mg total) by mouth 2 (two) times daily. For anxiety on schedule AM and PM 10/09/12  Yes Jacques Navy, MD  mirtazapine (REMERON) 45 MG tablet Take 45 mg by mouth at bedtime.   Yes Historical Provider, MD  omeprazole (PRILOSEC) 40 MG capsule Take 1 capsule (40 mg total) by mouth 2 (two) times daily. 09/17/12  Yes Jacques Navy, MD  oxyCODONE-acetaminophen (PERCOCET/ROXICET) 5-325 MG per tablet Take 1 tablet by mouth every 6 (six) hours as needed. For pain 10/29/12  Yes Etta Grandchild, MD  potassium chloride SA (K-DUR,KLOR-CON) 20 MEQ  tablet Take 1 tablet (20 mEq total) by mouth daily. 11/03/12  Yes Jacques Navy, MD  traZODone (DESYREL) 50 MG tablet Take 25 mg by mouth at bedtime. Takes 1/2 tablet   Yes Historical Provider, MD  trimethoprim (TRIMPEX) 100 MG tablet Take 100 mg by mouth daily.  04/25/11  Yes Jacques Navy, MD    Allergies:   Allergies  Allergen Reactions  . Prednisone Other (See Comments)    Delirium   . Sertraline Hcl Other (See Comments)    Delirium     Social History:  Ambulatory Wheelchair Lives at assisted living at [facility]   reports that she has never smoked. She quit smokeless tobacco use about 48 years ago. Her smokeless tobacco use included Snuff. She reports that she does not drink alcohol or use illicit drugs.   Family History: family history includes Cancer in her other and Heart disease in her other.    Physical Exam: Patient Vitals for the past 24 hrs:  BP Temp Temp src Pulse Resp SpO2  11/28/12 2215 139/55 mmHg - - 82 28 96 %  11/28/12 1927 129/48 mmHg 98 F (36.7 C) Oral 87 21 94 %  11/28/12 1819 -  99 F (37.2 C) Rectal 88 24 97 %  11/28/12 1757 150/55 mmHg 98.6 F (37 C) Oral 93 22 95 %    1. General:  in No Acute distress frail-appearing elderly female 2. Psychological: Alert and Oriented 3. Head/ENT:   Moist  Mucous Membranes                          Head Non traumatic, neck supple                          Normal  Dentition 4. SKIN: normal  Skin turgor,  Skin clean Dry and intact no rash 5. Heart: Regular rate and rhythm no Murmur, Rub or gallop 6. Lungs:  no wheezes or crackles  but very distant breath sounds bilaterally 7. Abdomen: Soft, non-tender, Non distended 8. Lower extremities: no clubbing, cyanosis, 1+ edema bilaterally 9. Neurologically Grossly intact, moving all 4 extremities equally 10. MSK: Normal range of motion  body mass index is unknown because there is no weight on file.   Labs on Admission:   Recent Labs  11/28/12 1858   NA 129*  K 4.4  CL 92*  CO2 28  GLUCOSE 163*  BUN 16  CREATININE 1.12*  CALCIUM 8.7   No results found for this basename: AST, ALT, ALKPHOS, BILITOT, PROT, ALBUMIN,  in the last 72 hours No results found for this basename: LIPASE, AMYLASE,  in the last 72 hours  Recent Labs  11/28/12 1858  WBC 7.8  HGB 10.1*  HCT 30.3*  MCV 84.4  PLT 375   No results found for this basename: CKTOTAL, CKMB, CKMBINDEX, TROPONINI,  in the last 72 hours No results found for this basename: TSH, T4TOTAL, FREET3, T3FREE, THYROIDAB,  in the last 72 hours No results found for this basename: VITAMINB12, FOLATE, FERRITIN, TIBC, IRON, RETICCTPCT,  in the last 72 hours Lab Results  Component Value Date   HGBA1C 6.3 06/09/2012    The CrCl is unknown because both a height and weight (above a minimum accepted value) are required for this calculation. ABG    Component Value Date/Time   PHART 7.490* 04/01/2012 2102   HCO3 27.8* 04/01/2012 2102   TCO2 25.0 04/01/2012 2102   O2SAT 93.2 04/01/2012 2102     No results found for this basename: DDIMER     Other results:  I have pearsonaly reviewed this: ECG REPORT  Rate: 93  Rhythm: NSR ST&T no ischemic changes:   UA no evidence of UTI BNP 1380  Cultures:    Component Value Date/Time   SDES BLOOD RIGHT FOREARM 06/04/2012 0847   SPECREQUEST BOTTLES DRAWN AEROBIC AND ANAEROBIC 5CC 06/04/2012 0847   CULT NO GROWTH 5 DAYS 06/04/2012 0847   REPTSTATUS 06/10/2012 FINAL 06/04/2012 0847       Radiological Exams on Admission: Dg Chest 2 View  11/28/2012   *RADIOLOGY REPORT*  Clinical Data: Cough.  Shortness of breath.  CHEST - 2 VIEW  Comparison: Two-view chest 11/04/2012.  Findings: The heart is enlarged.  Mild edema is superimposed on COPD.  The bilateral pleural effusions have increased, left greater than right.  There is ankylosis of the thoracic spine.  IMPRESSION:  1.  Increased left pleural effusion. 2.  Cardiac enlargement and mild edema, compatible  with congestive heart failure. 3.  Similar appearance of right pleural effusion.   Original Report Authenticated By: Marin Roberts, M.D.    Chart has  been reviewed  Assessment/Plan  79 year old female with history of CHF with preserved EF as well as history of COPD here with what appears to be a CHF exacerbation.  Present on Admission:  . Acute exacerbation of CHF (congestive heart failure) - admit procedure protocol give IV Lasix and continue diuresis. Full potassium and creatinine patient has chronic kidney disease  . Hypoxia - provide oxygen patient may need to be on home oxygen  . HYPERTENSION - continue home medications  . FIBRILLATION, ATRIAL - currently in sinus rhythm Will continue diltiazem. Patient is not a candidate for anticoagulation due to advanced age and history of frequent falls  . Chronic renal insufficiency -follow creatinine while diuresing  . Hyponatremia - this is chronic, obtain urine electrolytes. Fluid restriction, avoid offending medications   Prophylaxis:  Lovenox, Protonix  CODE STATUS: DNR/DNI as per patient's family  Other plan as per orders.  I have spent a total of 55 min on this admission  Patrice Matthew 11/28/2012, 10:51 PM

## 2012-11-28 NOTE — ED Notes (Addendum)
Per ems pt is from [facility]. Staff reported fever x1 week, with sob and congestion. Staff giving pt albuterol breathing treatment once a day. Staff called ems today bc pt wasn't getting any better. 20 g R forearm.   Family reported pt was seen at pcp earlier this week. And that pt always has some breathing problems, but difficulty breathing has increased.

## 2012-11-28 NOTE — ED Provider Notes (Signed)
Shelda Jakes, MD   Medical screening examination/treatment/procedure(s) were conducted as a shared visit with non-physician practitioner(s) and myself.  I personally evaluated the patient during the encounter   Results for orders placed during the hospital encounter of 11/28/12  CBC      Result Value Range   WBC 7.8  4.0 - 10.5 K/uL   RBC 3.59 (*) 3.87 - 5.11 MIL/uL   Hemoglobin 10.1 (*) 12.0 - 15.0 g/dL   HCT 96.0 (*) 45.4 - 09.8 %   MCV 84.4  78.0 - 100.0 fL   MCH 28.1  26.0 - 34.0 pg   MCHC 33.3  30.0 - 36.0 g/dL   RDW 11.9  14.7 - 82.9 %   Platelets 375  150 - 400 K/uL  BASIC METABOLIC PANEL      Result Value Range   Sodium 129 (*) 135 - 145 mEq/L   Potassium 4.4  3.5 - 5.1 mEq/L   Chloride 92 (*) 96 - 112 mEq/L   CO2 28  19 - 32 mEq/L   Glucose, Bld 163 (*) 70 - 99 mg/dL   BUN 16  6 - 23 mg/dL   Creatinine, Ser 5.62 (*) 0.50 - 1.10 mg/dL   Calcium 8.7  8.4 - 13.0 mg/dL   GFR calc non Af Amer 41 (*) >90 mL/min   GFR calc Af Amer 47 (*) >90 mL/min  PRO B NATRIURETIC PEPTIDE      Result Value Range   Pro B Natriuretic peptide (BNP) 1380.0 (*) 0 - 450 pg/mL  URINALYSIS, ROUTINE W REFLEX MICROSCOPIC      Result Value Range   Color, Urine YELLOW  YELLOW   APPearance CLOUDY (*) CLEAR   Specific Gravity, Urine 1.011  1.005 - 1.030   pH 7.0  5.0 - 8.0   Glucose, UA NEGATIVE  NEGATIVE mg/dL   Hgb urine dipstick NEGATIVE  NEGATIVE   Bilirubin Urine NEGATIVE  NEGATIVE   Ketones, ur NEGATIVE  NEGATIVE mg/dL   Protein, ur NEGATIVE  NEGATIVE mg/dL   Urobilinogen, UA 0.2  0.0 - 1.0 mg/dL   Nitrite NEGATIVE  NEGATIVE   Leukocytes, UA NEGATIVE  NEGATIVE  POCT I-STAT TROPONIN I      Result Value Range   Troponin i, poc 0.01  0.00 - 0.08 ng/mL   Comment 3            Dg Chest 2 View  11/28/2012   *RADIOLOGY REPORT*  Clinical Data: Cough.  Shortness of breath.  CHEST - 2 VIEW  Comparison: Two-view chest 11/04/2012.  Findings: The heart is enlarged.  Mild edema is  superimposed on COPD.  The bilateral pleural effusions have increased, left greater than right.  There is ankylosis of the thoracic spine.  IMPRESSION:  1.  Increased left pleural effusion. 2.  Cardiac enlargement and mild edema, compatible with congestive heart failure. 3.  Similar appearance of right pleural effusion.   Original Report Authenticated By: Marin Roberts, M.D.   Dg Chest 2 View  11/04/2012   *RADIOLOGY REPORT*  Clinical Data: Congestion and wheezing.  Question pneumonia.  CHEST - 2 VIEW  Comparison: 09/02/2012  Findings: Two views of the chest demonstrate development of bilateral pleural effusions.  The right effusion is small and the left effusion small-moderate in size.  Heart size appears be slightly enlarged.  There is no evidence for pulmonary edema.  Bony thorax is intact.  IMPRESSION: Development of bilateral pleural effusions, left side greater than right.  There is  no focal airspace disease but there is likely consolidation or atelectasis related to the pleural effusions.  Mild cardiomegaly.   Original Report Authenticated By: Richarda Overlie, M.D.    Patient with distant heart failure with oxygen requirement. Her oxygen saturations go down to 89% on room air. She does not normally on oxygen. On 2 L of oxygen she is up around 94- and    95%. Patient was sent in for shortness of breath but that maybe was related to COPD did not respond to inhalers. Chest x-ray consistent with CHF the patient has marked leg swelling. Patient has a history of CHF in the past. Troponin is negative EKG of his normal sinus rhythm has a history of atrial fib but currently is not in that. Discussed with hospitalist she will require admission. We did give her Lasix 80 mg. She normally takes 40 mg Lasix a day.     CRITICAL CARE Performed by: Shelda Jakes. Total critical care time: 30 Critical care time was exclusive of separately billable procedures and treating other patients. Critical care was  necessary to treat or prevent imminent or life-threatening deterioration. Critical care was time spent personally by me on the following activities: development of treatment plan with patient and/or surrogate as well as nursing, discussions with consultants, evaluation of patient's response to treatment, examination of patient, obtaining history from patient or surrogate, ordering and performing treatments and interventions, ordering and review of laboratory studies, ordering and review of radiographic studies, pulse oximetry and re-evaluation of patient's condition.   Shelda Jakes, MD 11/28/12 2209

## 2012-11-28 NOTE — ED Provider Notes (Signed)
History     CSN: [id number]  Arrival date & time 11/28/12  1748   First MD Initiated Contact with Patient 11/28/12 1820      Chief Complaint  Patient presents with  . Shortness of Breath    (Consider location/radiation/quality/duration/timing/severity/associated sxs/prior treatment) The history is provided by the patient and a relative. No language interpreter was used.   Pt is a 79yo female BIB EMS from assisted living at [facility], accompanied by daughter.  Staff from GP reports fever x1 week with worsening SOB and congestion.  Pt has hx of CHF, afib, and COPD with chronic dry cough that has worsened over the past week.  Duoneb treatments were given daily at facility but did not seem to help. Pt has been placed on nasal canula in past but has not required oxygen recently due to increased use of walker. Denies chest pain, abdominal pain, n/v/d, or dysuria. Denies pedal edema.    Past Medical History  Diagnosis Date  . Routine general medical examination at a health care facility   . Other B-complex deficiencies   . Unspecified hypothyroidism   . Diverticulitis of colon (without mention of hemorrhage)   . Congestive heart failure, unspecified   . Stricture and stenosis of esophagus   . Duodenal ulcer, unspecified as acute or chronic, without hemorrhage, perforation, or obstruction   . Atrial fibrillation   . Edema   . Dysphagia, unspecified(787.20)   . Depressive type psychosis   . Sciatica   . Other constipation   . Unspecified essential hypertension   . Arthritis   . Gallstones   . PONV (postoperative nausea and vomiting)     Past Surgical History  Procedure Laterality Date  . Abdominal hysterectomy    . Cholecystectomy      Family History  Problem Relation Age of Onset  . Cancer Other     breast   . Heart disease Other     History  Substance Use Topics  . Smoking status: Never Smoker   . Smokeless tobacco: Former Neurosurgeon    Types: Snuff    Quit date:  01/13/1964  . Alcohol Use: No    OB History   Grav Para Term Preterm Abortions TAB SAB Ect Mult Living                  Review of Systems  Constitutional: Negative for fever and chills.  Respiratory: Positive for cough, shortness of breath and wheezing. Negative for choking and chest tightness.   Cardiovascular: Negative for chest pain, palpitations and leg swelling.  Gastrointestinal: Negative for nausea, vomiting and diarrhea.  Genitourinary: Negative for dysuria.  All other systems reviewed and are negative.    Allergies  Prednisone and Sertraline hcl  Home Medications   Current Outpatient Rx  Name  Route  Sig  Dispense  Refill  . albuterol (PROVENTIL) (2.5 MG/3ML) 0.083% nebulizer solution   Nebulization   Take 2.5 mg by nebulization every 6 (six) hours as needed. For shortness of breath         . budesonide-formoterol (SYMBICORT) 80-4.5 MCG/ACT inhaler   Inhalation   Inhale 2 puffs into the lungs 2 (two) times daily.         . cloNIDine (CATAPRES) 0.1 MG tablet   Oral   Take 1 tablet (0.1 mg total) by mouth daily.   30 tablet   5   . diltiazem (CARDIZEM CD) 120 MG 24 hr capsule   Oral   Take 1 capsule (  120 mg total) by mouth every morning.   30 capsule   6   . docusate sodium (COLACE) 100 MG capsule   Oral   Take 100 mg by mouth at bedtime.         . furosemide (LASIX) 40 MG tablet   Oral   Take 40 mg by mouth every morning.          . gabapentin (NEURONTIN) 100 MG capsule   Oral   Take 100 mg by mouth at bedtime.          . hydrocerin (EUCERIN) CREA   Topical   Apply 1 application topically 2 (two) times daily. Pt applies to feet         . HYDROcodone-homatropine (HYCODAN) 5-1.5 MG/5ML syrup   Oral   Take 5 mLs by mouth every 6 (six) hours as needed for cough.   120 mL   1   . ipratropium-albuterol (DUONEB) 0.5-2.5 (3) MG/3ML SOLN   Nebulization   Take 3 mLs by nebulization 3 (three) times daily as needed (wheezing).          Marland Kitchen levothyroxine (SYNTHROID, LEVOTHROID) 25 MCG tablet   Oral   Take 1 tablet (25 mcg total) by mouth every morning.   30 tablet   5   . LORazepam (ATIVAN) 0.5 MG tablet   Oral   Take 1 tablet (0.5 mg total) by mouth 2 (two) times daily. For anxiety on schedule AM and PM   60 tablet   5   . mirtazapine (REMERON) 45 MG tablet   Oral   Take 45 mg by mouth at bedtime.         Marland Kitchen omeprazole (PRILOSEC) 40 MG capsule   Oral   Take 1 capsule (40 mg total) by mouth 2 (two) times daily.   60 capsule   5   . oxyCODONE-acetaminophen (PERCOCET/ROXICET) 5-325 MG per tablet   Oral   Take 1 tablet by mouth every 6 (six) hours as needed. For pain   60 tablet   0   . potassium chloride SA (K-DUR,KLOR-CON) 20 MEQ tablet   Oral   Take 1 tablet (20 mEq total) by mouth daily.   30 tablet   6   . traZODone (DESYREL) 50 MG tablet   Oral   Take 25 mg by mouth at bedtime. Takes 1/2 tablet         . trimethoprim (TRIMPEX) 100 MG tablet   Oral   Take 100 mg by mouth daily.            BP 129/48  Pulse 87  Temp(Src) 98 F (36.7 C) (Oral)  Resp 21  SpO2 94%  Physical Exam  Nursing note and vitals reviewed. Constitutional: She appears well-developed and well-nourished. No distress.  Pt is an elderly female lying comfortably in exam bed with nasal canula in place. NAD.  HENT:  Head: Normocephalic and atraumatic.  Eyes: Conjunctivae are normal. No scleral icterus.  Neck: Normal range of motion.  Cardiovascular: Normal rate, regular rhythm and normal heart sounds.   Pulmonary/Chest: Tachypnea noted. She has decreased breath sounds in the right lower field and the left lower field. She has wheezes ( diffuse). She has rhonchi. She has rales ( diffuse). She exhibits no tenderness.  Abdominal: Soft. Bowel sounds are normal. She exhibits no distension and no mass. There is no tenderness. There is no rebound and no guarding.  Musculoskeletal: Normal range of motion.  Neurological: She is  alert.  Skin: Skin is warm and dry. She is not diaphoretic.    ED Course  Procedures (including critical care time)  Labs Reviewed  CBC - Abnormal; Notable for the following:    RBC 3.59 (*)    Hemoglobin 10.1 (*)    HCT 30.3 (*)    All other components within normal limits  BASIC METABOLIC PANEL - Abnormal; Notable for the following:    Sodium 129 (*)    Chloride 92 (*)    Glucose, Bld 163 (*)    Creatinine, Ser 1.12 (*)    GFR calc non Af Amer 41 (*)    GFR calc Af Amer 47 (*)    All other components within normal limits  PRO B NATRIURETIC PEPTIDE - Abnormal; Notable for the following:    Pro B Natriuretic peptide (BNP) 1380.0 (*)    All other components within normal limits  URINALYSIS, ROUTINE W REFLEX MICROSCOPIC - Abnormal; Notable for the following:    APPearance CLOUDY (*)    All other components within normal limits  POCT I-STAT TROPONIN I   Dg Chest 2 View  11/28/2012   *RADIOLOGY REPORT*  Clinical Data: Cough.  Shortness of breath.  CHEST - 2 VIEW  Comparison: Two-view chest 11/04/2012.  Findings: The heart is enlarged.  Mild edema is superimposed on COPD.  The bilateral pleural effusions have increased, left greater than right.  There is ankylosis of the thoracic spine.  IMPRESSION:  1.  Increased left pleural effusion. 2.  Cardiac enlargement and mild edema, compatible with congestive heart failure. 3.  Similar appearance of right pleural effusion.   Original Report Authenticated By: Marin Roberts, M.D.     Date: 11/28/2012  Rate: 93  Rhythm: normal sinus rhythm  QRS Axis: normal  Intervals: normal  ST/T Wave abnormalities: normal  Conduction Disutrbances:none  Narrative Interpretation:   Old EKG Reviewed: unchanged    1. COPD exacerbation   2. CHF NYHA class III (symptoms with mildly strenuous activities), unspecified failure chronicity, unspecified type   3. Hypoxia       MDM  Pt has hx of CHF, afib, COPD, experiencing worsening SOB after  duoneb at [facility].  Lung sounds suggestive of pulmonary edema.  Will get CBC, BMP, BNP, istat troponin, EKG, and CXR.  Started pt on Duoneb.  Pt denies pain at this time.  Will reevaluate.    Labs consistent with pt's baseline including BNP EKG: nl  CXR: increased left pleural effusion, cardiac enlargement with mild edema, compatible with CHF.   SOB likely due to CHF and COPD exacerbation.   Pt started on 80 of lasix.  Discussed pt with Dr. Deretha Emory who stated pt must be admitted if she is requiring O2 nasal canula in ED because pt does not currently have O2 at home.  9:12 PM O2 has been cut off to see if pt's stats drop.  Will reevaluate.   Pt stats do drop while off O2, remain at 94% while on 2L.  Dr. Deretha Emory will consult hospitalist to admit pt for COPD and CHF exacerbation with hypoxia.     Junius Finner, PA-C 11/29/12 1037

## 2012-11-28 NOTE — ED Notes (Signed)
RT called for breathing treatment.

## 2012-11-28 NOTE — ED Notes (Signed)
Unable to insert Foley catheter.

## 2012-11-29 DIAGNOSIS — N182 Chronic kidney disease, stage 2 (mild): Secondary | ICD-10-CM

## 2012-11-29 DIAGNOSIS — I5033 Acute on chronic diastolic (congestive) heart failure: Principal | ICD-10-CM

## 2012-11-29 DIAGNOSIS — I369 Nonrheumatic tricuspid valve disorder, unspecified: Secondary | ICD-10-CM

## 2012-11-29 DIAGNOSIS — J441 Chronic obstructive pulmonary disease with (acute) exacerbation: Secondary | ICD-10-CM

## 2012-11-29 DIAGNOSIS — I4891 Unspecified atrial fibrillation: Secondary | ICD-10-CM

## 2012-11-29 DIAGNOSIS — I509 Heart failure, unspecified: Secondary | ICD-10-CM

## 2012-11-29 LAB — CBC WITH DIFFERENTIAL/PLATELET
Basophils Absolute: 0 10*3/uL (ref 0.0–0.1)
Basophils Relative: 1 % (ref 0–1)
Eosinophils Relative: 1 % (ref 0–5)
HCT: 30.9 % — ABNORMAL LOW (ref 36.0–46.0)
Hemoglobin: 10.2 g/dL — ABNORMAL LOW (ref 12.0–15.0)
MCH: 28.1 pg (ref 26.0–34.0)
MCHC: 33 g/dL (ref 30.0–36.0)
MCV: 85.1 fL (ref 78.0–100.0)
Monocytes Absolute: 1 10*3/uL (ref 0.1–1.0)
Monocytes Relative: 12 % (ref 3–12)
Neutro Abs: 6.2 10*3/uL (ref 1.7–7.7)
RDW: 15.1 % (ref 11.5–15.5)

## 2012-11-29 LAB — TSH: TSH: 1.379 u[IU]/mL (ref 0.350–4.500)

## 2012-11-29 LAB — BASIC METABOLIC PANEL
BUN: 13 mg/dL (ref 6–23)
Calcium: 8.8 mg/dL (ref 8.4–10.5)
Chloride: 95 mEq/L — ABNORMAL LOW (ref 96–112)
Creatinine, Ser: 1.03 mg/dL (ref 0.50–1.10)
GFR calc Af Amer: 52 mL/min — ABNORMAL LOW (ref >90)
GFR calc non Af Amer: 45 mL/min — ABNORMAL LOW (ref >90)

## 2012-11-29 LAB — MRSA PCR SCREENING: MRSA by PCR: NEGATIVE

## 2012-11-29 LAB — CREATININE, URINE, RANDOM: Creatinine, Urine: 10.4 mg/dL

## 2012-11-29 LAB — TROPONIN I: Troponin I: <0.3 ng/mL (ref <0.30)

## 2012-11-29 LAB — SODIUM, URINE, RANDOM: Sodium, Ur: 91 mEq/L

## 2012-11-29 LAB — OSMOLALITY, URINE: Osmolality, Ur: 246 mOsm/kg — ABNORMAL LOW (ref 390–1090)

## 2012-11-29 MED ORDER — ONDANSETRON HCL 4 MG/2ML IJ SOLN: 4.0000 mg | Freq: Four times a day (QID) | INTRAMUSCULAR | Status: DC | PRN

## 2012-11-29 MED ORDER — MIRTAZAPINE 45 MG PO TABS
45.0000 mg | ORAL_TABLET | Freq: Every day | ORAL | Status: DC
  Administered 2012-11-29 – 2012-12-03 (×6): 45 mg via ORAL
  Filled 2012-11-29 (×7): qty 1

## 2012-11-29 MED ORDER — ALBUTEROL SULFATE (5 MG/ML) 0.5% IN NEBU
2.5000 mg | INHALATION_SOLUTION | RESPIRATORY_TRACT | Status: DC | PRN
  Administered 2012-11-29 – 2012-12-02 (×3): 2.5 mg via RESPIRATORY_TRACT
  Filled 2012-11-29 (×3): qty 0.5

## 2012-11-29 MED ORDER — TRIMETHOPRIM 100 MG PO TABS
100.0000 mg | ORAL_TABLET | Freq: Every day | ORAL | Status: DC
  Administered 2012-11-29 – 2012-12-04 (×6): 100 mg via ORAL
  Filled 2012-11-29 (×6): qty 1

## 2012-11-29 MED ORDER — ALBUTEROL SULFATE (5 MG/ML) 0.5% IN NEBU: 2.5000 mg | INHALATION_SOLUTION | Freq: Three times a day (TID) | RESPIRATORY_TRACT | Status: DC | PRN

## 2012-11-29 MED ORDER — POTASSIUM CHLORIDE CRYS ER 20 MEQ PO TBCR
20.0000 meq | EXTENDED_RELEASE_TABLET | Freq: Every day | ORAL | Status: DC
  Administered 2012-11-29 – 2012-12-04 (×6): 20 meq via ORAL
  Filled 2012-11-29 (×6): qty 1

## 2012-11-29 MED ORDER — IPRATROPIUM BROMIDE 0.02 % IN SOLN
0.5000 mg | Freq: Three times a day (TID) | RESPIRATORY_TRACT | Status: DC | PRN
  Administered 2012-11-30: 0.5 mg via RESPIRATORY_TRACT
  Filled 2012-11-29 (×3): qty 2.5

## 2012-11-29 MED ORDER — ENOXAPARIN SODIUM 40 MG/0.4ML ~~LOC~~ SOLN
40.0000 mg | SUBCUTANEOUS | Status: DC
  Filled 2012-11-29: qty 0.4

## 2012-11-29 MED ORDER — LORAZEPAM 0.5 MG PO TABS
0.5000 mg | ORAL_TABLET | Freq: Two times a day (BID) | ORAL | Status: DC
  Administered 2012-11-29 – 2012-12-04 (×12): 0.5 mg via ORAL
  Filled 2012-11-29 (×12): qty 1

## 2012-11-29 MED ORDER — ALBUTEROL SULFATE (5 MG/ML) 0.5% IN NEBU
2.5000 mg | INHALATION_SOLUTION | Freq: Three times a day (TID) | RESPIRATORY_TRACT | Status: DC
  Administered 2012-11-30 (×3): 2.5 mg via RESPIRATORY_TRACT
  Filled 2012-11-29 (×3): qty 0.5

## 2012-11-29 MED ORDER — TRAZODONE 25 MG HALF TABLET
25.0000 mg | ORAL_TABLET | Freq: Every day | ORAL | Status: DC
  Administered 2012-11-29 – 2012-12-03 (×6): 25 mg via ORAL
  Filled 2012-11-29 (×7): qty 1

## 2012-11-29 MED ORDER — IPRATROPIUM BROMIDE 0.02 % IN SOLN
0.5000 mg | Freq: Three times a day (TID) | RESPIRATORY_TRACT | Status: DC
  Administered 2012-11-30 (×3): 0.5 mg via RESPIRATORY_TRACT
  Filled 2012-11-29 (×3): qty 2.5

## 2012-11-29 MED ORDER — IPRATROPIUM BROMIDE 0.02 % IN SOLN: 0.5000 mg | Freq: Three times a day (TID) | RESPIRATORY_TRACT | Status: DC

## 2012-11-29 MED ORDER — HYDROCODONE-HOMATROPINE 5-1.5 MG/5ML PO SYRP: 5.0000 mL | ORAL_SOLUTION | Freq: Four times a day (QID) | ORAL | Status: DC | PRN

## 2012-11-29 MED ORDER — ENOXAPARIN SODIUM 30 MG/0.3ML ~~LOC~~ SOLN
30.0000 mg | SUBCUTANEOUS | Status: DC
  Administered 2012-11-29 – 2012-12-04 (×6): 30 mg via SUBCUTANEOUS
  Filled 2012-11-29 (×6): qty 0.3

## 2012-11-29 MED ORDER — ACETAMINOPHEN 325 MG PO TABS: 650.0000 mg | ORAL_TABLET | ORAL | Status: DC | PRN

## 2012-11-29 MED ORDER — FUROSEMIDE 10 MG/ML IJ SOLN
40.0000 mg | Freq: Two times a day (BID) | INTRAMUSCULAR | Status: DC
  Administered 2012-11-29 – 2012-11-30 (×3): 40 mg via INTRAVENOUS
  Filled 2012-11-29 (×6): qty 4

## 2012-11-29 MED ORDER — GUAIFENESIN ER 600 MG PO TB12
600.0000 mg | ORAL_TABLET | Freq: Two times a day (BID) | ORAL | Status: DC
  Administered 2012-11-29 – 2012-12-04 (×12): 600 mg via ORAL
  Filled 2012-11-29 (×13): qty 1

## 2012-11-29 MED ORDER — DOCUSATE SODIUM 100 MG PO CAPS
100.0000 mg | ORAL_CAPSULE | Freq: Every day | ORAL | Status: DC
  Administered 2012-11-29 – 2012-12-03 (×6): 100 mg via ORAL
  Filled 2012-11-29 (×7): qty 1

## 2012-11-29 MED ORDER — DILTIAZEM HCL ER COATED BEADS 120 MG PO CP24
120.0000 mg | ORAL_CAPSULE | Freq: Every morning | ORAL | Status: DC
  Administered 2012-11-29 – 2012-12-04 (×6): 120 mg via ORAL
  Filled 2012-11-29 (×6): qty 1

## 2012-11-29 MED ORDER — SODIUM CHLORIDE 0.9 % IJ SOLN
3.0000 mL | Freq: Two times a day (BID) | INTRAMUSCULAR | Status: DC
  Administered 2012-11-29 – 2012-12-04 (×10): 3 mL via INTRAVENOUS

## 2012-11-29 MED ORDER — SODIUM CHLORIDE 0.9 % IJ SOLN: 3.0000 mL | INTRAMUSCULAR | Status: DC | PRN

## 2012-11-29 MED ORDER — SODIUM CHLORIDE 0.9 % IV SOLN: 250.0000 mL | INTRAVENOUS | Status: DC | PRN

## 2012-11-29 MED ORDER — OXYCODONE-ACETAMINOPHEN 5-325 MG PO TABS
1.0000 | ORAL_TABLET | Freq: Four times a day (QID) | ORAL | Status: DC | PRN
  Administered 2012-11-30 – 2012-12-03 (×2): 1 via ORAL
  Filled 2012-11-29 (×2): qty 1

## 2012-11-29 MED ORDER — GABAPENTIN 100 MG PO CAPS
100.0000 mg | ORAL_CAPSULE | Freq: Every day | ORAL | Status: DC
  Administered 2012-11-29 – 2012-12-03 (×6): 100 mg via ORAL
  Filled 2012-11-29 (×7): qty 1

## 2012-11-29 MED ORDER — BUDESONIDE-FORMOTEROL FUMARATE 80-4.5 MCG/ACT IN AERO
2.0000 | INHALATION_SPRAY | Freq: Two times a day (BID) | RESPIRATORY_TRACT | Status: DC
  Administered 2012-11-29 – 2012-12-04 (×10): 2 via RESPIRATORY_TRACT
  Filled 2012-11-29 (×2): qty 6.9

## 2012-11-29 MED ORDER — LEVOTHYROXINE SODIUM 25 MCG PO TABS
25.0000 ug | ORAL_TABLET | Freq: Every morning | ORAL | Status: DC
  Administered 2012-11-29 – 2012-12-04 (×6): 25 ug via ORAL
  Filled 2012-11-29 (×6): qty 1

## 2012-11-29 MED ORDER — PANTOPRAZOLE SODIUM 40 MG PO TBEC
40.0000 mg | DELAYED_RELEASE_TABLET | Freq: Every day | ORAL | Status: DC
  Administered 2012-11-29 – 2012-12-04 (×6): 40 mg via ORAL
  Filled 2012-11-29 (×7): qty 1

## 2012-11-29 MED ORDER — IPRATROPIUM-ALBUTEROL 0.5-2.5 (3) MG/3ML IN SOLN: 3.0000 mL | Freq: Three times a day (TID) | RESPIRATORY_TRACT | Status: DC | PRN

## 2012-11-29 MED ORDER — CLONIDINE HCL 0.1 MG PO TABS
0.1000 mg | ORAL_TABLET | Freq: Every day | ORAL | Status: DC
  Administered 2012-11-29 – 2012-12-04 (×6): 0.1 mg via ORAL
  Filled 2012-11-29 (×6): qty 1

## 2012-11-29 MED ORDER — ALBUTEROL SULFATE (5 MG/ML) 0.5% IN NEBU: 2.5000 mg | INHALATION_SOLUTION | RESPIRATORY_TRACT | Status: DC

## 2012-11-29 NOTE — Progress Notes (Signed)
Subjective: [name] is well known to the practice. She has had several admission due to respiratory distress. She has a history of diastolic heart failure. She was brought to ED for increased shortness of breath and was found to have acute diastolic heart failure and has been admitted for IV diuresis, oxygen support and further evaluation.  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 Increased left pleural effusion.  2. Cardiac enlargement and mild edema, compatible with congestive  heart failure.  3. Similar appearance of right pleural effusion.  Scheduled Meds: . ipratropium  0.5 mg Nebulization Q4H   And  . albuterol  2.5 mg Nebulization Q4H  . budesonide-formoterol  2 puff Inhalation BID  . cloNIDine  0.1 mg Oral Daily  . diltiazem  120 mg Oral q morning - 10a  . docusate sodium  100 mg Oral QHS  . enoxaparin (LOVENOX) injection  30 mg Subcutaneous Q24H  . furosemide  40 mg Intravenous BID  . gabapentin  100 mg Oral QHS  . guaiFENesin  600 mg Oral BID  . levothyroxine  25 mcg Oral q morning - 10a  . LORazepam  0.5 mg Oral BID  . mirtazapine  45 mg Oral QHS  . pantoprazole  40 mg Oral Daily  . potassium chloride SA  20 mEq Oral Daily  . sodium chloride  3 mL Intravenous Q12H  . traZODone  25 mg Oral QHS  . trimethoprim  100 mg Oral Daily   Continuous Infusions:  PRN Meds:.sodium chloride, acetaminophen, albuterol, albuterol, HYDROcodone-homatropine, ipratropium, ondansetron (ZOFRAN) IV, oxyCODONE-acetaminophen, sodium chloride   Physical Exam: Filed Vitals:   11/29/12 0116  BP: 121/56  Pulse: 86  Temp: 97.9 F (36.6 C)  Resp: 32    Intake/Output Summary (Last 24 hours) at 11/29/12 1321 Last data filed at 11/29/12 0900  Gross per 24 hour  Intake    240 ml  Output    475 ml  Net   -235 ml   Gen'l - Very elderly, frail white woman in no distress HEENT- Temporal wasting is noted and facial weight loss. C&S clear, PERRLA Cor- 2+ radial pulse, quiet precordium, RRR PUlm - no increased WOB. She has coarse upper airway rhonchi with forced expiration, coarse rhonchi right base > left, no rales Neuro - A&O, speech clear  Tele reveiwed - sinus rhythm with controlled rate      Assessment/Plan: 1. Cardiac - patient with acute diastolic heart failure. On May 28 BNP 248, at admission May 31 BNP 1380 with change on x-ray. She has been started on IV lasix and feels better although I/O only -235.  2 D echo done with results pending. Patient has been holding sinus rhythm.  Plan Continue IV diuresis  2. Pulmonary - patient has COPD and uses respiratory inhalers. She has a tendency to become very anxious which exacerbates her SOB. Presently she is doing well.   Plan Continue home medications.  3. Hyponatremia - sodium is improved  with fluid restriction.  Plan  Bmet in AM  4. CKD - creatinine @ 1.03 is fine.  Continue other home medications   [name] [place] [facility] (o) [phone]; (c) [phone] Call-grp - [facility]  Tele: [phone]  11/29/2012, 1:18 PM

## 2012-11-29 NOTE — ED Provider Notes (Signed)
Medical screening examination/treatment/procedure(s) were conducted as a shared visit with non-physician practitioner(s) and myself.  I personally evaluated the patient during the encounter  Shelda Jakes, MD 11/29/12 [phone]

## 2012-11-29 NOTE — Progress Notes (Signed)
  Echocardiogram 2D Echocardiogram has been performed.  Jovonte Commins 11/29/2012, 10:43 AM

## 2012-11-30 ENCOUNTER — Inpatient Hospital Stay (HOSPITAL_COMMUNITY): Payer: Medicare Other

## 2012-11-30 LAB — BASIC METABOLIC PANEL
CO2: 32 mEq/L (ref 19–32)
Chloride: 93 mEq/L — ABNORMAL LOW (ref 96–112)
Creatinine, Ser: 1.1 mg/dL (ref 0.50–1.10)
GFR calc Af Amer: 48 mL/min — ABNORMAL LOW (ref >90)
Potassium: 4 mEq/L (ref 3.5–5.1)
Sodium: 132 mEq/L — ABNORMAL LOW (ref 135–145)

## 2012-11-30 LAB — PRO B NATRIURETIC PEPTIDE: Pro B Natriuretic peptide (BNP): 1060 pg/mL — ABNORMAL HIGH (ref 0–450)

## 2012-11-30 MED ORDER — ALBUTEROL SULFATE (5 MG/ML) 0.5% IN NEBU
2.5000 mg | INHALATION_SOLUTION | Freq: Three times a day (TID) | RESPIRATORY_TRACT | Status: DC
  Administered 2012-12-01 – 2012-12-04 (×11): 2.5 mg via RESPIRATORY_TRACT
  Filled 2012-11-30 (×11): qty 0.5

## 2012-11-30 MED ORDER — IPRATROPIUM BROMIDE 0.02 % IN SOLN
0.5000 mg | Freq: Three times a day (TID) | RESPIRATORY_TRACT | Status: DC
  Administered 2012-12-01 – 2012-12-04 (×11): 0.5 mg via RESPIRATORY_TRACT
  Filled 2012-11-30 (×11): qty 2.5

## 2012-11-30 MED ORDER — FUROSEMIDE 40 MG PO TABS
40.0000 mg | ORAL_TABLET | Freq: Two times a day (BID) | ORAL | Status: DC
  Administered 2012-11-30 – 2012-12-04 (×9): 40 mg via ORAL
  Filled 2012-11-30 (×12): qty 1

## 2012-11-30 NOTE — Progress Notes (Signed)
Subjective: Awake and alert. She says she feels better.  Objective: Lab:  Recent Labs  11/28/12 1858 11/29/12 0523  WBC 7.8 7.9  NEUTROABS  --  6.2  HGB 10.1* 10.2*  HCT 30.3* 30.9*  MCV 84.4 85.1  PLT 375 346    Recent Labs  11/28/12 1858 11/29/12 0523 11/30/12 0425  NA 129* 132* 132*  K 4.4 3.8 4.0  CL 92* 95* 93*  GLUCOSE 163* 147* 132*  BUN 16 13 16   CREATININE 1.12* 1.03 1.10  CALCIUM 8.7 8.8 8.6    Imaging: no new imaging  Scheduled Meds: . albuterol  2.5 mg Nebulization Q8H   And  . ipratropium  0.5 mg Nebulization Q8H  . budesonide-formoterol  2 puff Inhalation BID  . cloNIDine  0.1 mg Oral Daily  . diltiazem  120 mg Oral q morning - 10a  . docusate sodium  100 mg Oral QHS  . enoxaparin (LOVENOX) injection  30 mg Subcutaneous Q24H  . furosemide  40 mg Intravenous BID  . gabapentin  100 mg Oral QHS  . guaiFENesin  600 mg Oral BID  . levothyroxine  25 mcg Oral q morning - 10a  . LORazepam  0.5 mg Oral BID  . mirtazapine  45 mg Oral QHS  . pantoprazole  40 mg Oral Daily  . potassium chloride SA  20 mEq Oral Daily  . sodium chloride  3 mL Intravenous Q12H  . traZODone  25 mg Oral QHS  . trimethoprim  100 mg Oral Daily   Continuous Infusions:  PRN Meds:.sodium chloride, acetaminophen, albuterol, albuterol, HYDROcodone-homatropine, ipratropium, ondansetron (ZOFRAN) IV, oxyCODONE-acetaminophen, sodium chloride   Physical Exam: Filed Vitals:   11/30/12 0459  BP: 132/47  Pulse: 76  Temp: 97.6 F (36.4 C)  Resp: 20    Intake/Output Summary (Last 24 hours) at 11/30/12 0815 Last data filed at 11/30/12 0730  Gross per 24 hour  Intake    780 ml  Output   1200 ml  Net   -420 ml   Total this adm: neg 895 cc  Wt Readings from Last 3 Encounters:  11/30/12 101 lb 6.6 oz (46 kg)  11/24/12 108 lb (48.988 kg)  11/04/12 115 lb 12 oz (52.504 kg)   Frail elderly woman in no distress HEENT - temporal wasting Cor- 2+ radial pulse regular Pulm - no  increased WOB, harsh cough Neuro - A&O Psych - not agitated       Assessment/Plan: 1. Cardiac - gentle diuresis.  Plan 2 v CXR  Repeat BNP  Convert to PO lasix  2. Pulm - no increased WOB. No complaints of dyspnea  3. Hyponatremia - stable sodium at 132.  4. CKD - stable   [name] (o) [phone]; (c) [phone] Call-grp - [name]  Tele: [phone]  11/30/2012, 8:15 AM

## 2012-12-01 LAB — BASIC METABOLIC PANEL
CO2: 32 mEq/L (ref 19–32)
Chloride: 93 mEq/L — ABNORMAL LOW (ref 96–112)
Creatinine, Ser: 1.4 mg/dL — ABNORMAL HIGH (ref 0.50–1.10)
GFR calc Af Amer: 36 mL/min — ABNORMAL LOW (ref >90)
Potassium: 4.3 mEq/L (ref 3.5–5.1)
Sodium: 131 mEq/L — ABNORMAL LOW (ref 135–145)

## 2012-12-01 NOTE — Clinical Documentation Improvement (Signed)
CKD DOCUMENTATION CLARIFICATION QUERY   THIS DOCUMENT IS NOT A PERMANENT PART OF THE MEDICAL RECORD  TO RESPOND TO THE THIS QUERY, FOLLOW THE INSTRUCTIONS BELOW:  1. If needed, update documentation for the patient's encounter via the notes activity.  2. Access this query again and click edit on the In [employer].  3. After updating, or not, click F2 to complete all highlighted (required) fields concerning your review. Select "additional documentation in the medical record" OR "no additional documentation provided".  4. Click Sign note button.  5. The deficiency will fall out of your In Basket *Please let us know if you are not able to complete this workflow by phone or e-mail (listed below).  Please update your documentation within the medical record to reflect your response to this query.                                                                                        12/01/12   Dear Dr. [name]   / Associates,  In a better effort to capture your patient's severity of illness, reflect appropriate length of stay and utilization of resources, a review of the patient medical record has revealed the following indicators.    Based on your clinical judgment, please clarify and document in a progress note and/or discharge summary the clinical condition associated with the following supporting information:  You may use possible, probable, or suspect with inpatient documentation. possible, probable, suspected diagnoses MUST be documented at the time of discharge  In responding to this query please exercise your independent judgment.  The fact that a query is asked, does not imply that any particular answer is desired or expected.  Possible Clinical Conditions?  Please clarify stage of CKD.    _______CKD Stage I -  GFR > OR = 90 _______CKD Stage II - GFR 60-80 _______CKD Stage III - GFR 30-59 _______CKD Stage IV - GFR 15-29 _______Other condition_____________ _______Cannot  Clinically determine   Supporting Information:  Per  11/28/12  H&P Full potassium and creatinine patient has chronic kidney disease  Chronic renal insufficiency -follow creatinine while diuresing   Per  11/29/12 progress notes: CKD - creatinine @ 1.03 is fine.   Per 12/01/12 progress note: CKD - mild rise in creatinine with diuresis.       Reviewed: CKD III    Thank You,  Darlene Rich Number RN, BSN, CCM   Clinical Documentation Specialist:  Phone [phone] Health Information Management [place]

## 2012-12-01 NOTE — Progress Notes (Signed)
Clinical Social Work Department BRIEF PSYCHOSOCIAL ASSESSMENT 12/01/2012  Patient:  [name] [name], [name] [name]     Account Number:  [id number]     Admit date:  11/28/2012  Clinical Social Worker:  Hattie Perch  Date/Time:  12/01/2012 12:00 M  Referred by:  Physician  Date Referred:  12/01/2012 Referred for  SNF Placement   Other Referral:   Interview type:  Family Other interview type:    PSYCHOSOCIAL DATA Living Status:  FACILITY Admitted from facility:  [facility] [address] Level of care:  Assisted Living Primary support name:  [name] Primary support relationship to patient:  CHILD, ADULT Degree of support available:   good    CURRENT CONCERNS Current Concerns  Post-Acute Placement  Post-Acute Placement   Other Concerns:    SOCIAL WORK ASSESSMENT / PLAN Patient is confused and unable to participate in assessment. CSW called patients daughter, she confirms that patient is a resident of [facility] and will return there upon discharge.   Assessment/plan status:   Other assessment/ plan:   Information/referral to community resources:    PATIENT'S/FAMILY'S RESPONSE TO PLAN OF CARE: patient daughter is agreeable to return to [facility].

## 2012-12-01 NOTE — Progress Notes (Signed)
Subjective: She is emotional and is wanting her breakfast. Reports that she is breathing better. Denies any chest pain or other discomfort  Objective: Lab:  Recent Labs  11/28/12 1858 11/29/12 0523  WBC 7.8 7.9  NEUTROABS  --  6.2  HGB 10.1* 10.2*  HCT 30.3* 30.9*  MCV 84.4 85.1  PLT 375 346    Recent Labs  11/29/12 0523 11/30/12 0425 12/01/12 0505  NA 132* 132* 131*  K 3.8 4.0 4.3  CL 95* 93* 93*  GLUCOSE 147* 132* 149*  BUN 13 16 23   CREATININE 1.03 1.10 1.40*  CALCIUM 8.8 8.6 8.8   BNP 1060  Imaging: 2 V CXR 11/30/12: IMPRESSION:  Left pleural effusion is significantly enlarged compared to prior  Exam.  2 D echo 11/29/12: Study Conclusions  - Left ventricle: The cavity size was normal. Wall thickness was normal. Systolic function was vigorous. The estimated ejection fraction was in the range of 65% to 70%. Wall motion was normal; there were no regional wall motion abnormalities. There was an increased relative contribution of atrial contraction to ventricular filling. - Pulmonary arteries: PA peak pressure: 31mm Hg (S).    Scheduled Meds: . ipratropium  0.5 mg Nebulization TID   And  . albuterol  2.5 mg Nebulization TID  . budesonide-formoterol  2 puff Inhalation BID  . cloNIDine  0.1 mg Oral Daily  . diltiazem  120 mg Oral q morning - 10a  . docusate sodium  100 mg Oral QHS  . enoxaparin (LOVENOX) injection  30 mg Subcutaneous Q24H  . furosemide  40 mg Oral BID  . gabapentin  100 mg Oral QHS  . guaiFENesin  600 mg Oral BID  . levothyroxine  25 mcg Oral q morning - 10a  . LORazepam  0.5 mg Oral BID  . mirtazapine  45 mg Oral QHS  . pantoprazole  40 mg Oral Daily  . potassium chloride SA  20 mEq Oral Daily  . sodium chloride  3 mL Intravenous Q12H  . traZODone  25 mg Oral QHS  . trimethoprim  100 mg Oral Daily   Continuous Infusions:  PRN Meds:.sodium chloride, acetaminophen, albuterol, albuterol, HYDROcodone-homatropine, ipratropium, ondansetron  (ZOFRAN) IV, oxyCODONE-acetaminophen, sodium chloride   Physical Exam: Filed Vitals:   12/01/12 0444  BP: 111/47  Pulse: 80  Temp: 98.3 F (36.8 C)  Resp: 18    Intake/Output Summary (Last 24 hours) at 12/01/12 0737 Last data filed at 12/01/12 0445  Gross per 24 hour  Intake    540 ml  Output   1500 ml  Net   -960 ml   Total this Adm: -1.8 L gen'l - Elderly frail white woman in no distress HEENT- C&S clear Cor - 2+ radial, heart sounds distant, regular Pulm - decreased BS left base, no rales or wheezing, no increased work of breathing Neuro - a little agitated but at her baseline generally    Assessment/Plan: 1. Cardiac - adequate diuresis - 1.8 l. Breathing is unlabored. CXR with increased effusion left. BNP down but still mildly elevated  Plan Continue present medications  D/c tele  2. Pulm - no wheezing or respiratory distress  3. Hyponatremia essentially unchanged.  4. CKD - mild rise in creatinine with diuresis. No change in diuretics at this time.   dispo - hopefully return to [place] tomorrow.    [name] [place] [facility] (o) [phone]; (c) [phone] Call-grp - [facility]  Tele: [phone]  12/01/2012, 7:30 AM

## 2012-12-02 ENCOUNTER — Telehealth: Payer: Self-pay

## 2012-12-02 DIAGNOSIS — R062 Wheezing: Secondary | ICD-10-CM

## 2012-12-02 LAB — BASIC METABOLIC PANEL
CO2: 34 mEq/L — ABNORMAL HIGH (ref 19–32)
Calcium: 9.1 mg/dL (ref 8.4–10.5)
Potassium: 4.8 mEq/L (ref 3.5–5.1)
Sodium: 131 mEq/L — ABNORMAL LOW (ref 135–145)

## 2012-12-02 NOTE — Evaluation (Signed)
Physical Therapy Evaluation Patient Details Name: [name] [name] MRN: [id number] DOB: 01-Aug-1916 Today's Date: 12/02/2012 Time: 4696-2952 PT Time Calculation (min): 14 min  PT Assessment / Plan / Recommendation Clinical Impression  Pt is a 79 year old female admitted for SOB and CHF from [facility].  Pt very SOB and using accessory muscles for breathing.  Pt declined OOB activity however rolled for bed pan and sat EOB with increased encouragement.  Pt would benefit from acute PT services in order to improve independence with bed mobility and transfers in preparation for d/c to next venue.  Pt appears to require more assist than prior to admission so recommend possible increase in care whether ALF can provide vs SNF.  May benefit from PT after d/c however will continue to assess if pt will tolerate and/or wish to participate.    PT Assessment  Patient needs continued PT services    Follow Up Recommendations  Supervision/Assistance - 24 hour;SNF    Does the patient have the potential to tolerate intense rehabilitation      Barriers to Discharge        Equipment Recommendations  None recommended by PT    Recommendations for Other Services     Frequency Min 2X/week    Precautions / Restrictions Precautions Precautions: Fall   Pertinent Vitals/Pain Maintained O2 [place]      Mobility  Bed Mobility Bed Mobility: Supine to Sit;Sit to Supine;Rolling Right;Rolling Left Rolling Right: 4: Min assist Rolling Left: 4: Min assist Supine to Sit: 4: Min assist;HOB elevated Sit to Supine: 4: Min assist;HOB flat Details for Bed Mobility Assistance: verbal cues for technique, some assist for trunk upright, assist for hips to complete roll with change of linen and bed pan (pt refused BSC) Transfers Transfers: Not assessed    Exercises     PT Diagnosis: Generalized weakness  PT Problem List: Decreased activity tolerance;Decreased mobility;Cardiopulmonary status limiting  activity PT Treatment Interventions: DME instruction;Functional mobility training;Therapeutic activities;Therapeutic exercise;Wheelchair mobility training;Patient/family education   PT Goals Acute Rehab PT Goals PT Goal Formulation: With patient Time For Goal Achievement: 12/16/12 Potential to Achieve Goals: Fair Pt will go Supine/Side to Sit: with supervision PT Goal: Supine/Side to Sit - Progress: Goal set today Pt will Sit at Edge of Bed: with supervision;3-5 min;with no upper extremity support PT Goal: Sit at Edge Of Bed - Progress: Goal set today Pt will go Sit to Supine/Side: with supervision PT Goal: Sit to Supine/Side - Progress: Goal set today Pt will Transfer Bed to Chair/Chair to Bed: with supervision PT Transfer Goal: Bed to Chair/Chair to Bed - Progress: Goal set today  Visit Information  Last PT Received On: 12/02/12 Assistance Needed: +1    Subjective Data  Subjective: I need to pee. Patient Stated Goal: none stated   Prior Functioning  Home Living Type of Home: Assisted living Home Adaptive Equipment: Wheelchair - manual Prior Function Level of Independence: Needs assistance Comments: pt states ALF assists her with any needs however did not go into detail, does state she can stand pivot to w/c for mobility Communication Communication: No difficulties    Cognition  Cognition Arousal/Alertness: Awake/alert Behavior During Therapy: WFL for tasks assessed/performed Overall Cognitive Status: Within Functional Limits for tasks assessed    Extremity/Trunk Assessment Right Lower Extremity Assessment RLE ROM/Strength/Tone: Deficits RLE ROM/Strength/Tone Deficits: muscular atrophy present, appears [facility] for tasks assessed however pt unwilling to stand or transfer today Left Lower Extremity Assessment LLE ROM/Strength/Tone: Deficits LLE ROM/Strength/Tone Deficits: muscular atrophy  present, appears [facility] for tasks assessed however pt unwilling to stand or transfer today    Balance Balance Balance Assessed: Yes Dynamic Sitting Balance Dynamic Sitting - Balance Support: No upper extremity supported;Feet unsupported Dynamic Sitting - Level of Assistance: 4: Min assist Reach (Patient is able to reach ___ inches to right, left, forward, back): min/guard for forward reach to touch near ankles  End of Session PT - End of Session Activity Tolerance: Patient limited by fatigue;Other (comment) (SOB) Patient left: in bed;with call bell/phone within reach;with family/visitor present  GP     Ercia Crisafulli,KATHrine E 12/02/2012, 3:44 PM Zenovia Jarred, PT, DPT 12/02/2012 Pager: [phone]

## 2012-12-02 NOTE — Telephone Encounter (Signed)
Daughter notified 

## 2012-12-02 NOTE — Telephone Encounter (Signed)
Given age and condition I think we are looking at permanent

## 2012-12-02 NOTE — Evaluation (Signed)
Occupational Therapy Evaluation Patient Details Name: [name] [name] MRN: [id number] DOB: 1917-02-06 Today's Date: 12/02/2012 Time: 1191-4782 OT Time Calculation (min): 15 min  OT Assessment / Plan / Recommendation Clinical Impression  This 79 year old female is a resident of [facility].  She was admitted for CHF exacerbation.  PLOF is unknown, but pt is deconditioned and needs extensive assistance with adls at this time.  She would benefit from skilled OT to increase activity tolerance to improve quality of life    OT Assessment  Patient needs continued OT Services    Follow Up Recommendations  SNF    Barriers to Discharge      Equipment Recommendations  3 in 1 bedside comode    Recommendations for Other Services    Frequency  Min 2X/week    Precautions / Restrictions Precautions Precautions: Fall Restrictions Weight Bearing Restrictions: No   Pertinent Vitals/Pain No pain reported    ADL  Grooming: Minimal assistance;Brushing hair Where Assessed - Grooming: Unsupported sitting Transfers/Ambulation Related to ADLs: pt agreeable to sitting eob.  Needed to use bathroom but did not want to get up to 3:1 commode.  Rolled for bedpan ADL Comments: Pt required mod A for toilet hygiene, for back area--rolled for this.  She is able to reach to ankles but she has poor activity tolerance and would need max assist for adls.  Pt demonstrated pursed lip breathing during session.  On 2 liters 02.  Pt coughed often but nonproductive    OT Diagnosis: Generalized weakness  OT Problem List: Decreased strength;Decreased activity tolerance;Cardiopulmonary status limiting activity (standing balance NT) OT Treatment Interventions: Self-care/ADL training;DME and/or AE instruction;Patient/family education;Therapeutic activities   OT Goals Acute Rehab OT Goals OT Goal Formulation: With patient/family Time For Goal Achievement: 12/16/12 Potential to Achieve Goals: Good ADL Goals Pt  Will Transfer to Toilet: with min assist;Stand pivot transfer;3-in-1 (min guard) ADL Goal: Toilet Transfer - Progress: Goal set today Pt Will Perform Toileting - Hygiene: with supervision;Leaning right and/or left on 3-in-1/toilet;Sitting on 3-in-1 or toilet ADL Goal: Toileting - Hygiene - Progress: Goal set today Miscellaneous OT Goals Miscellaneous OT Goal #1: Pt will perform bed mobility as precursor for adls with supervision, HOB raised using rails OT Goal: Miscellaneous Goal #1 - Progress: Goal set today Miscellaneous OT Goal #2: pt will sit eob x 10 minutes and perform light adl activity to increase strength/endurance for adls OT Goal: Miscellaneous Goal #2 - Progress: Goal set today  Visit Information  Last OT Received On: 12/02/12 Assistance Needed: +1 (for bed mobility) PT/OT Co-Evaluation/Treatment: Yes    Subjective Data  Subjective: i'm not supposed to get up Patient Stated Goal: none stated.  Agreeable to OT/PT   Prior Functioning     Home Living Type of Home: Assisted living Home Adaptive Equipment: Wheelchair - manual Prior Function Level of Independence: Needs assistance Comments: she has help for whatever she needs:  did not specify amount.  SPT to w/c Communication Communication: No difficulties         Vision/Perception     Cognition  Cognition Arousal/Alertness: Awake/alert Behavior During Therapy: WFL for tasks assessed/performed Overall Cognitive Status: Within Functional Limits for tasks assessed    Extremity/Trunk Assessment Right Upper Extremity Assessment RUE ROM/Strength/Tone: Within functional levels Left Upper Extremity Assessment LUE ROM/Strength/Tone: Within functional levels Right Lower Extremity Assessment RLE ROM/Strength/Tone: Deficits RLE ROM/Strength/Tone Deficits: muscular atrophy present, appears [facility] for tasks assessed however pt unwilling to stand or transfer today Left Lower Extremity Assessment  LLE ROM/Strength/Tone:  Deficits LLE ROM/Strength/Tone Deficits: muscular atrophy present, appears [facility] for tasks assessed however pt unwilling to stand or transfer today     Mobility Bed Mobility Bed Mobility: Supine to Sit;Sit to Supine;Rolling Right;Rolling Left Rolling Right: 4: Min assist Rolling Left: 4: Min assist Supine to Sit: 4: Min assist;HOB elevated Sit to Supine: 4: Min assist;HOB flat Details for Bed Mobility Assistance: verbal cues for technique, some assist for trunk upright, assist for hips to complete roll with change of linen and bed pan (pt refused BSC) Transfers Transfers: Not assessed     Exercise     Balance Balance Balance Assessed: Yes Dynamic Sitting Balance Dynamic Sitting - Balance Support: No upper extremity supported;Feet unsupported Dynamic Sitting - Level of Assistance: 4: Min assist Reach (Patient is able to reach ___ inches to right, left, forward, back): min/guard for forward reach to touch near ankles   End of Session OT - End of Session Activity Tolerance: Patient limited by fatigue Patient left: in bed;with call bell/phone within reach;with family/visitor present;with bed alarm set  GO     Rahmir Beever 12/02/2012, 4:02 PM Marica Otter, OTR/L [phone] 12/02/2012

## 2012-12-02 NOTE — Telephone Encounter (Signed)
Phone call from patient's daughter, Lucile Shutters. She would like to know if patients move to skilled nursing is permanent move or not? She needs to let [facility] place know whether to give up her room. Please advise.

## 2012-12-02 NOTE — Progress Notes (Signed)
Subjective: Mrs. Kinkade reports that she had breathing trouble last night but is better this morning. She has had a foley placed   Objective: Lab: No results found for this basename: WBC, NEUTROABS, HGB, HCT, MCV, PLT,  in the last 72 hours  Recent Labs  11/30/12 0425 12/01/12 0505 12/02/12 0505  NA 132* 131* 131*  K 4.0 4.3 4.8  CL 93* 93* 92*  GLUCOSE 132* 149* 132*  BUN 16 23 26*  CREATININE 1.10 1.40* 1.30*  CALCIUM 8.6 8.8 9.1    Imaging:  Scheduled Meds: . ipratropium  0.5 mg Nebulization TID   And  . albuterol  2.5 mg Nebulization TID  . budesonide-formoterol  2 puff Inhalation BID  . cloNIDine  0.1 mg Oral Daily  . diltiazem  120 mg Oral q morning - 10a  . docusate sodium  100 mg Oral QHS  . enoxaparin (LOVENOX) injection  30 mg Subcutaneous Q24H  . furosemide  40 mg Oral BID  . gabapentin  100 mg Oral QHS  . guaiFENesin  600 mg Oral BID  . levothyroxine  25 mcg Oral q morning - 10a  . LORazepam  0.5 mg Oral BID  . mirtazapine  45 mg Oral QHS  . pantoprazole  40 mg Oral Daily  . potassium chloride SA  20 mEq Oral Daily  . sodium chloride  3 mL Intravenous Q12H  . traZODone  25 mg Oral QHS  . trimethoprim  100 mg Oral Daily   Continuous Infusions:  PRN Meds:.sodium chloride, acetaminophen, albuterol, albuterol, HYDROcodone-homatropine, ipratropium, ondansetron (ZOFRAN) IV, oxyCODONE-acetaminophen, sodium chloride   Physical Exam: Filed Vitals:   12/02/12 0643  BP: 104/58  Pulse: 81  Temp: 97.5 F (36.4 C)  Resp: 18    Intake/Output Summary (Last 24 hours) at 12/02/12 0732 Last data filed at 12/02/12 1610  Gross per 24 hour  Intake    600 ml  Output    825 ml  Net   -225 ml   Total I/O this adm: - 2,080  gen'l very frail elderly woman who is able to sit partially upright for exam with assistance HEENT- C&S clear Cor- 2+ radial, RRR Pulm - poor inspiratory effort with poor air movement. No obvious rales, decreassed sounds left  base. Abd- soft Neuro - awake and alert, MAE but appears weak.      Assessment/Plan: 1. Cardiac - adequate diuresis, about 2 liters  2. Pulm - no increased work of breathing, coarse cough, no wheezing  3. Hyponatremia - no change, still slightly low  4. CKD III - estimated GFR 34 and probable worse in a very elderly patient  5. Dispo - patient is progressively frail and AL will not meet her needs. Will get assessment from PT and OT about eligibility for SNF   [employer] [facility] (o) [phone]; (c) [phone] Call-grp - Patsi Sears IM  Tele: [phone]  12/02/2012, 7:24 AM

## 2012-12-02 NOTE — Care Management Note (Addendum)
    Page 1 of 1   12/04/2012     3:24:55 PM   CARE MANAGEMENT NOTE 12/04/2012  Patient:  [name] [name], [name] [name]   Account Number:  [id number]  Date Initiated:  11/29/2012  Documentation initiated by:  [facility]  Subjective/Objective Assessment:   79 year old female admitted with CHF.     Action/Plan:   Lives at [facility].   Anticipated DC Date:  12/04/2012   Anticipated DC Plan:  SKILLED NURSING FACILITY  In-house referral  Clinical Social Worker      DC Planning Services  CM consult      Choice offered to / List presented to:             Status of service:  Completed, signed off Medicare Important Message given?  NA - LOS <3 / Initial given by admissions (If response is "NO", the following Medicare IM given date fields will be blank) Date Medicare IM given:   Date Additional Medicare IM given:    Discharge Disposition:  SKILLED NURSING FACILITY  Per UR Regulation:  Reviewed for med. necessity/level of care/duration of stay  If discussed at Long Length of Stay Meetings, dates discussed:   12/03/2012    Comments:  12/04/12 Tyrann Donaho RN,BSN NCM [phone] D/C SNF.  12/02/12 Ritu Gagliardo RN,BSN NCM [phone] NOTED NA-131,BUN-26,CREAT-1.3-CKD,F/C.NEBS,02.PT/OT CONS.

## 2012-12-03 NOTE — Progress Notes (Signed)
Subjective: Good spirits - a few laughs. No complaints  Objective: Lab: No results found for this basename: WBC, NEUTROABS, HGB, HCT, MCV, PLT,  in the last 72 hours  Recent Labs  12/01/12 0505 12/02/12 0505  NA 131* 131*  K 4.3 4.8  CL 93* 92*  GLUCOSE 149* 132*  BUN 23 26*  CREATININE 1.40* 1.30*  CALCIUM 8.8 9.1    Imaging: no new imaging  Scheduled Meds: . ipratropium  0.5 mg Nebulization TID   And  . albuterol  2.5 mg Nebulization TID  . budesonide-formoterol  2 puff Inhalation BID  . cloNIDine  0.1 mg Oral Daily  . diltiazem  120 mg Oral q morning - 10a  . docusate sodium  100 mg Oral QHS  . enoxaparin (LOVENOX) injection  30 mg Subcutaneous Q24H  . furosemide  40 mg Oral BID  . gabapentin  100 mg Oral QHS  . guaiFENesin  600 mg Oral BID  . levothyroxine  25 mcg Oral q morning - 10a  . LORazepam  0.5 mg Oral BID  . mirtazapine  45 mg Oral QHS  . pantoprazole  40 mg Oral Daily  . potassium chloride SA  20 mEq Oral Daily  . sodium chloride  3 mL Intravenous Q12H  . traZODone  25 mg Oral QHS  . trimethoprim  100 mg Oral Daily   Continuous Infusions:  PRN Meds:.sodium chloride, acetaminophen, albuterol, albuterol, HYDROcodone-homatropine, ipratropium, ondansetron (ZOFRAN) IV, oxyCODONE-acetaminophen, sodium chloride   Physical Exam: Filed Vitals:   12/03/12 0553  BP: 132/38  Pulse: 79  Temp: 98.4 F (36.9 C)  Resp: 20    Intake/Output Summary (Last 24 hours) at 12/03/12 1238 Last data filed at 12/03/12 0848  Gross per 24 hour  Intake    680 ml  Output    675 ml  Net      5 ml   IO to date: -2,280  Very frail and elderly white woman HEENT- Temporal wasting Cor- 2+ radial, RRR Pulm - no increased WOB, no rales or wheezes Abd- BS+ , soft Neuro - stable     Assessment/Plan: 1. Cardiac - good diuresis. No SOB Plan F/u BNP in AM  2. PUlm - doing well, O2 sat stable on oxygen  3. Hyponatermia -  F/u lab in AM  4. CKD III - for AM  lab  5. Dispo - seems medical stable and ready for transfer to SNF when bed available, hopefully tomorrow.    [name] [place] IM (o) [phone]; (c) [phone] Call-grp - Patsi Sears IM  Tele: [phone]  12/03/2012, 12:38 PM

## 2012-12-03 NOTE — Progress Notes (Signed)
CSW spoke with patient's daughter regarding bed offers. She is requesting [facility]. CSW notified [facility] of same.  Jodiann Ognibene C. Galilea Quito MSW, LCSW [phone]

## 2012-12-04 DIAGNOSIS — I129 Hypertensive chronic kidney disease with stage 1 through stage 4 chronic kidney disease, or unspecified chronic kidney disease: Secondary | ICD-10-CM

## 2012-12-04 DIAGNOSIS — E039 Hypothyroidism, unspecified: Secondary | ICD-10-CM

## 2012-12-04 LAB — BASIC METABOLIC PANEL
CO2: 33 mEq/L — ABNORMAL HIGH (ref 19–32)
Calcium: 9.3 mg/dL (ref 8.4–10.5)
GFR calc non Af Amer: 39 mL/min — ABNORMAL LOW (ref >90)
Potassium: 4.3 mEq/L (ref 3.5–5.1)
Sodium: 130 mEq/L — ABNORMAL LOW (ref 135–145)

## 2012-12-04 LAB — PRO B NATRIURETIC PEPTIDE: Pro B Natriuretic peptide (BNP): 1049 pg/mL — ABNORMAL HIGH (ref 0–450)

## 2012-12-04 MED ORDER — OXYCODONE-ACETAMINOPHEN 5-325 MG PO TABS: 1.0000 | ORAL_TABLET | Freq: Four times a day (QID) | ORAL | Status: DC | PRN

## 2012-12-04 MED ORDER — ALBUTEROL SULFATE (5 MG/ML) 0.5% IN NEBU: 2.5000 mg | INHALATION_SOLUTION | Freq: Three times a day (TID) | RESPIRATORY_TRACT | Status: DC | PRN

## 2012-12-04 MED ORDER — HYDROCODONE-HOMATROPINE 5-1.5 MG/5ML PO SYRP: 5.0000 mL | ORAL_SOLUTION | Freq: Four times a day (QID) | ORAL | Status: DC | PRN

## 2012-12-04 MED ORDER — LORAZEPAM 0.5 MG PO TABS: 0.5000 mg | ORAL_TABLET | Freq: Two times a day (BID) | ORAL | Status: DC

## 2012-12-04 NOTE — Progress Notes (Signed)
Clinical Social Work  CSW faxed DC summary and AVS to [employer] who is agreeable to admission today. CSW prepared DC packet with FL2, DNR and hard scripts included. CSW left a message for dtr regarding DC. Patient and RN aware and agreeable to DC. CSW coordinated transportation via [employer]. Service Request Number: [phone]. CSW is signing off.  Unk Lightning, LCSW (Coverage for [employer])

## 2012-12-04 NOTE — Progress Notes (Signed)
Physical Therapy Treatment Patient Details Name: [name] [name] MRN: [id number] DOB: Feb 11, 1917 Today's Date: 12/04/2012 Time: 6213-0865 PT Time Calculation (min): 14 min  PT Assessment / Plan / Recommendation Comments on Treatment Session  Assisted pt OOB to [facility] to void then to recliner.  Positioned with pillows to comfort.  Pt plans to D/C to [facility] today.    Follow Up Recommendations  Supervision/Assistance - 24 hour;SNF     Does the patient have the potential to tolerate intense rehabilitation     Barriers to Discharge        Equipment Recommendations  None recommended by PT    Recommendations for Other Services    Frequency Min 2X/week   Plan      Precautions / Restrictions   O2  Pertinent Vitals/Pain No c/o pain    Mobility  Bed Mobility Bed Mobility: Supine to Sit Rolling Right: 4: Min assist Details for Bed Mobility Assistance: increased time and 25% VC's on proper hand placement Transfers Transfers: Sit to Stand;Stand to Sit Sit to Stand: 4: Min assist;From bed;From toilet Stand to Sit: 4: Min assist;To toilet;To chair/3-in-1 Details for Transfer Assistance: 50% VC's on proper hand placement/turn completion and increased time Ambulation/Gait Ambulation/Gait Assistance Details: Pt declined to attempt amb and only agreed to get OOB to [facility] then to recliner.    PT Goals                                                     progressing    Visit Information  Last PT Received On: 12/04/12 Assistance Needed: +1    Subjective Data      Cognition       Balance     End of Session PT - End of Session Equipment Utilized During Treatment: Gait belt Activity Tolerance: Patient limited by fatigue;Other (comment) Patient left: in chair;with call bell/phone within reach   Felecia Shelling  PTA [facility]  Acute  Rehab Pager      [phone]

## 2012-12-04 NOTE — Progress Notes (Signed)
Stable and ready for transfer to SNF  Priority dd/c dictation # [phone]

## 2012-12-04 NOTE — Progress Notes (Signed)
Occupational Therapy Treatment Patient Details Name: ANDILYN BETTCHER MRN: [id number] DOB: [birth year] Today's Date: 12/04/2012 Time: 4098-1191 OT Time Calculation (min): 15 min  OT Assessment / Plan / Recommendation Comments on Treatment Session Pt much more animated today.  Sat up several hours and was proud that she sat in chair.    Follow Up Recommendations  SNF    Barriers to Discharge       Equipment Recommendations  3 in 1 bedside comode    Recommendations for Other Services    Frequency     Plan      Precautions / Restrictions Precautions Precautions: Fall Restrictions Weight Bearing Restrictions: No   Pertinent Vitals/Pain No pain    ADL  Toilet Transfer: Minimal assistance;Moderate assistance (mod A spt without walker, min A with walker) Toilet Transfer Method: Stand pivot Toilet Transfer Equipment: Bedside commode Toileting - Clothing Manipulation and Hygiene: +1 Total assistance Where Assessed - Toileting Clothing Manipulation and Hygiene: Standing Transfers/Ambulation Related to ADLs: pt up in chair and wanted to get back to bed.  SPT to 3:1 prior to bed.  Pt has a tendency to lean posteriorly when first standing up. ADL Comments: Unable to release a hand in standing for hygiene    OT Diagnosis:    OT Problem List:   OT Treatment Interventions:     OT Goals ADL Goals Pt Will Transfer to Toilet: with min assist;Stand pivot transfer;3-in-1 ADL Goal: Toilet Transfer - Progress: Progressing toward goals Pt Will Perform Toileting - Hygiene: with supervision;Leaning right and/or left on 3-in-1/toilet;Sitting on 3-in-1 or toilet ADL Goal: Toileting - Hygiene - Progress:  (did from standing today) Miscellaneous OT Goals Miscellaneous OT Goal #1: Pt will perform bed mobility as precursor for adls with supervision, HOB raised using rails OT Goal: Miscellaneous Goal #1 - Progress: Progressing toward goals Miscellaneous OT Goal #2: pt will sit eob x 10 minutes and  perform light adl activity to increase strength/endurance for adls  Visit Information  Last OT Received On: 12/04/12 Assistance Needed: +1    Subjective Data      Prior Functioning       Cognition  Cognition Arousal/Alertness: Awake/alert Behavior During Therapy: WFL for tasks assessed/performed Overall Cognitive Status: Within Functional Limits for tasks assessed    Mobility  Bed Mobility Bed Mobility: Supine to Sit Rolling Right: 4: Min assist Sit to Supine: 4: Min assist;HOB elevated Details for Bed Mobility Assistance: assist for LLE; unable to scoot to L with cues; assist to move up in bed Transfers Sit to Stand: 4: Min assist;3: Mod assist;From bed;From chair/3-in-1 (mod from chair without walker) Stand to Sit: 4: Min assist;To toilet;To chair/3-in-1 Details for Transfer Assistance: cues for hand placement; assist to rise and steady    Exercises      Balance     End of Session OT - End of Session Activity Tolerance: Patient tolerated treatment well  GO     Smayan Hackbart 12/04/2012, 2:13 PM Marica Otter, OTR/L [phone] 12/04/2012

## 2012-12-04 NOTE — Discharge Summary (Signed)
NAMEMarland Kitchen  [name], [name] NO.:  [id number]  MEDICAL RECORD NO.:  [id number]  LOCATION:  1408                         FACILITY:  [facility]  PHYSICIAN:  [name] Gess. Juanice Warburton, MD  DATE OF BIRTH:  Apr 20, 1917  DATE OF ADMISSION:  11/28/2012 DATE OF DISCHARGE:  12/04/2012                              DISCHARGE SUMMARY   ADMITTING DIAGNOSES: 1. Acute exacerbation congestive heart failure that is acute on     chronic diastolic heart failure. 2. Hypoxemia secondary to #1. 3. Hypertension. 4. Atrial fibrillation. 5. Chronic kidney disease III. 6. Hyponatremia.  DISCHARGE DIAGNOSES: 1. Acute exacerbation congestive heart failure that is acute on     chronic diastolic heart failure. 2. Hypoxemia secondary to #1. 3. Hypertension. 4. Atrial fibrillation. 5. Chronic kidney disease III. 6. Hyponatremia.  CONSULTANTS:  None.  PROCEDURES: 1. Imaging chest x-ray; Nov 28, 2012, which revealed increased left     pleural effusion.  Cardiac enlargement, mild edema compatible with     congestive heart failure.  Similar appearance of right pleural     effusion. 2. Chest x-ray, November 30, 2012, which revealed left pleural effusion     significantly enlarged compared to prior exam.  HISTORY OF PRESENT ILLNESS:  [name] is a 79 year old woman followed for multiple medical problems including chronic diastolic heart failure, B complex deficiency, hypothyroid disease, history of diverticulosis with diverticulitis with hemorrhage in the past, esophageal stricture, history of duodenal ulcer, history of atrial fibrillation, history of dysphagia, history of depressive-type psychosis and agitation, history of sciatica, history of constipation, and history of hypertension, history of arthritis, history of postoperative nausea and vomiting.  The patient had been seen in the office for evaluation of weakness and increasing shortness of breath on Nov 24, 2012.  At that time,  her examination was unremarkable except for her advanced age and frailty. Laboratory was checked on that date.  Urinalysis was negative.  CBC was negative.  BNP was in the normal range.  TSH was in normal range.  The patient had progressive shortness of breath after returned to her facility.  She was subsequently brought to the emergency department for evaluation and was found to have an exacerbation CHF by radiographic findings as well as clinical exam.  BNP was rechecked and was significantly elevated at 13:80.  She was subsequently admitted for gentle diuresis and management.  Please see the H and P for past medical history, family history, social history, and admission physical exam.  HOSPITAL COURSE: 1. Acute on chronic diastolic heart failure.  The patient was gently     diuresed and put out a total of 2300 mL.  Laboratory was followed     closely in regard to her heart failure.  She radiographically     improved except for left pleural effusion.  Followup BNP was 1060     on November 30, 2012, and 1049 on December 04, 2012.  She had no respiratory     distress, was moving air well with good oxygen saturation.  This     problem was considered stabilized. 2. Pulmonary.  The patient has a history of COPD with asthmatic     component and does require breathing  treatments.  Her symptoms are     exacerbated when she becomes anxious.  She has done well on     hospital stable on oxygen, which should be continued on ongoing     basis.  At time of discharge,  the patient's respirations are     unlabored. 3. Hyponatremia.  The patient had mild hyponatremia at the time of     admission, with a sodium of 131, at time of discharge, her sodium     was 138, she is asymptomatic, would suggest very minimal fluid     restriction, is only treatment. 4. Chronic kidney disease III.  The patient has a baseline creatinine     clearance of approximately 44 mL/minute, which is a calculated     value in  overestimated given her advanced age.  During her hospital     stay, she has done well.  She was closely monitored.  In regard to     her diuresis and sodium was low, but potassium remained stable.     Final creatinine was approximately her baseline at 1.15. Disposition; the patient has become progressive progressively frail and her needs have been more difficult to meet.  She no longer can manage at assisted living.  During her hospitalization, she was seen by both physical therapy and occupational therapy, they did recommend 24-hour/7 care and skilled nursing for management of her multiple medical problems and medications.  The patient is scheduled to go to [facility] for ongoing skilled care placement.  Code status; at this point, the patient is not a candidate for cardiac or pulmonary resuscitation or aggressive ICU type care.  DISCHARGE EXAMINATION:  VITAL SIGNS:  Temperature was 98.3, blood pressure was 85/32, and has been low.  There was some variability. Heart rate was 82, respirations 20, oxygen saturation is 99%. GENERAL APPEARANCE:  This is a very elderly frail-appearing Caucasian woman in no acute distress, who is bright, alert, conversant, at the time of her exam. HEENT:  Mild temporal wasting is noted.  Conjunctiva and sclerae were clear.  Pupils equal, round, and reactive. NECK:  Supple.  There is no thyromegaly. NODES:  No adenopathy was appreciated in the sub mandibular, cervical, supraclavicular regions. CARDIOVASCULAR:  A 2+ radial pulses, palpable at the radial artery.  Her precordium is quiet.  She has a regular rate and rhythm.  No murmur was appreciated. BREAST:  Deferred. PULMONARY:  The patient has no increased work of breathing.  Respiratory rate is normal.  Auscultation revealed the patient has some coarse rhonchi at right base.  Otherwise clear without wheezing or rales. ABDOMEN:  Soft.  No guarding or rebound was appreciated. GENITALIA/RECTAL:   Deferred. EXTREMITIES:  The patient has some mild interosseous wasting.  She has no significant deformities. NEURO:  The patient is awake, alert.  She recognizes the examiner.  Her speech is clear.  She has normal facial symmetry and movement extraocular muscles were intact.  She moves all extremities to command. DERM:  The patient had no skin breakdown on the upper extremities, back, or legs.  Sacrum was not examined.  FINAL LABORATORY:  From December 04, 2012, day of discharge; sodium 130, potassium 4.3, chloride of 90, CO2 of 33, BUN of 29, creatinine 1.15, glucose was 126.  BNP was 1049.  The patient had cardiac enzymes at admission with troponin that was negative x3.  Final CBC from June 1 with a white count of 7900, hemoglobin of 10.2 g which is chronic, platelet  count 346,000, differential was 78% segs, 9% lymphs, 12% monos. Last hemoglobin A1c was 6.3% on June 09, 2012.  Blood sugars during her hospital stay were slightly elevated, from low of 126 to a high of 149.  TSH on November 29, 2012, was 1.4.  It was 1.98 on Nov 24, 2012. Urinalysis from Nov 28, 2012, was negative.  Urine osmolalities was 246. Urine sodium was 91.  Urine creatinine was 10.4.  The patient had admit MRSA nasal swab that was negative on June 1.  DISCHARGE MEDICATIONS: 1. Albuterol nebulizer treatment 2.5 mg per 3 mL treatment every 6     hours p.r.n. 2. Symbicort 80/4.5, 2 puffs b.i.d. 3. Clonidine 0.1 mg p.o. daily.  Hold for a systolic blood pressure     less than 100. 4. Diltiazem CD 120 mg capsule 1 p.o. daily. 5. Colace 100 mg at bedtime. 6. Furosemide 40 mg p.o. q.a.m. 7. Gabapentin 100 mg at bedtime. 8. Eucerin cream apply topically b.i.d. to areas of dryness and     irritation. 9. Hycodan cough syrup 5/1.55 mL q.6 hours p.r.n. cough. 10.DuoNeb with ipratropium and albuterol 0.5/2.5, 1 nebulizer     treatment 3 times daily as needed. 11.Synthroid 25 mcg daily. 12.Lorazepam 0.5 mg b.i.d. to manage  anxiety, hold for sedation. 13.Remeron 45 mg at bedtime. 14.Omeprazole 40 mg p.o. q.a.m. 15.Oxycodone. 16.Acetaminophen 5/325 every 6 hours p.r.n. moderate to severe pain. 17.Trazodone 50 mg taking 1/2 a tablet, 25 mg, p.o. at bedtime, hold     for sedation. 18.Trimethoprim 100 mg p.o. daily for urinary tract suppression.  FINAL DISPOSITION:  The patient is to be transferred to skilled care.  CODE STATUS:  The patient is a DNR, out of facility order is includws in discharge papers.  The patient's condition at time of discharge dictation is medically stable, improved, with a very guarded prognosis given her very advanced age and weakness.  The patient may follow all facility protocols.  She may have leave of absence with family.  She should have ongoing PT and OT evaluations and treatment.  The patient can be seen in the office of Dr. Debby Bud on an as- needed basis.  Should she return to hospital if she would be readmitted to Dr. Izora Ribas [facility].     [name] Gess Stark Aguinaga, MD     MEN/MEDQ  D:  12/04/2012  T:  12/04/2012  Job:  [id number]

## 2012-12-07 ENCOUNTER — Non-Acute Institutional Stay (SKILLED_NURSING_FACILITY): Payer: Medicare Other | Admitting: Internal Medicine

## 2012-12-07 ENCOUNTER — Other Ambulatory Visit: Payer: Self-pay | Admitting: *Deleted

## 2012-12-07 DIAGNOSIS — I509 Heart failure, unspecified: Secondary | ICD-10-CM

## 2012-12-07 DIAGNOSIS — R531 Weakness: Secondary | ICD-10-CM

## 2012-12-07 DIAGNOSIS — I4891 Unspecified atrial fibrillation: Secondary | ICD-10-CM

## 2012-12-07 DIAGNOSIS — J4489 Other specified chronic obstructive pulmonary disease: Secondary | ICD-10-CM

## 2012-12-07 DIAGNOSIS — R5381 Other malaise: Secondary | ICD-10-CM

## 2012-12-07 DIAGNOSIS — F32A Depression, unspecified: Secondary | ICD-10-CM

## 2012-12-07 DIAGNOSIS — F323 Major depressive disorder, single episode, severe with psychotic features: Secondary | ICD-10-CM

## 2012-12-07 DIAGNOSIS — J449 Chronic obstructive pulmonary disease, unspecified: Secondary | ICD-10-CM

## 2012-12-07 MED ORDER — HYDROCODONE-HOMATROPINE 5-1.5 MG/5ML PO SYRP: 5.0000 mL | ORAL_SOLUTION | Freq: Four times a day (QID) | ORAL | Status: DC | PRN

## 2012-12-07 MED ORDER — OXYCODONE-ACETAMINOPHEN 5-325 MG PO TABS: 1.0000 | ORAL_TABLET | Freq: Four times a day (QID) | ORAL | Status: DC | PRN

## 2012-12-07 MED ORDER — LORAZEPAM 0.5 MG PO TABS: ORAL_TABLET | ORAL | Status: DC

## 2012-12-07 NOTE — Progress Notes (Signed)
Patient ID: [name] [name], female   DOB: January 26, 1917, 79 y.o.   MRN: [id number]    PCP: [name], MD  Code Status: DNR  Allergies  Allergen Reactions  . Prednisone Other (See Comments)    Delirium   . Sertraline Hcl Other (See Comments)    Delirium     Chief Complaint: new admit post hospitalization 11/28/12- 12/04/12  HPI:  79 y/o female patient with history of chf, hypothyroidism, afib, dysphagia among others was admitted to the hospital with weakness and worsening SOB. She had acute chf exacerbation and was diuresed. Given her weakness she was sent to SNF for rehabilitation. She was seen in her room today. She is lying in bed and in NAD. She is on o2 by nasal canula, mentions she wants to take a nap. Her breathing is comfortable on current oxygen. Denies any complaints. No concern from staff. See ros  Review of Systems  Constitutional: Negative for fever, chills and diaphoresis.  HENT: Negative for congestion.   Respiratory: Negative for cough and shortness of breath.   Cardiovascular: Negative for chest pain and leg swelling.  Gastrointestinal: Negative for heartburn, nausea, vomiting and abdominal pain.  Genitourinary: Negative for dysuria.  Skin: Negative for rash.  Neurological: Positive for weakness. Negative for dizziness, speech change and headaches.    Past Medical History  Diagnosis Date  . Routine general medical examination at a health care facility   . Other B-complex deficiencies   . Unspecified hypothyroidism   . Diverticulitis of colon (without mention of hemorrhage)   . Congestive heart failure, unspecified   . Stricture and stenosis of esophagus   . Duodenal ulcer, unspecified as acute or chronic, without hemorrhage, perforation, or obstruction   . Atrial fibrillation   . Edema   . Dysphagia, unspecified(787.20)   . Depressive type psychosis   . Sciatica   . Other constipation   . Unspecified essential hypertension   . Arthritis   . Gallstones    . PONV (postoperative nausea and vomiting)    Past Surgical History  Procedure Laterality Date  . Abdominal hysterectomy    . Cholecystectomy     Social History:   reports that she has never smoked. She quit smokeless tobacco use about 48 years ago. Her smokeless tobacco use included Snuff. She reports that she does not drink alcohol or use illicit drugs.  Family History  Problem Relation Age of Onset  . Cancer Other     breast   . Heart disease Other     Medications: Patient's Medications  New Prescriptions   No medications on file  Previous Medications   ALBUTEROL (PROVENTIL) (2.5 MG/3ML) 0.083% NEBULIZER SOLUTION    Take 2.5 mg by nebulization every 6 (six) hours as needed. For shortness of breath   BUDESONIDE-FORMOTEROL (SYMBICORT) 80-4.5 MCG/ACT INHALER    Inhale 2 puffs into the lungs 2 (two) times daily.   CLONIDINE (CATAPRES) 0.1 MG TABLET    Take 1 tablet (0.1 mg total) by mouth daily.   DILTIAZEM (CARDIZEM CD) 120 MG 24 HR CAPSULE    Take 1 capsule (120 mg total) by mouth every morning.   DOCUSATE SODIUM (COLACE) 100 MG CAPSULE    Take 100 mg by mouth at bedtime.   FUROSEMIDE (LASIX) 40 MG TABLET    Take 40 mg by mouth every morning.    GABAPENTIN (NEURONTIN) 100 MG CAPSULE    Take 100 mg by mouth at bedtime.    HYDROCERIN (EUCERIN) CREA  Apply 1 application topically 2 (two) times daily. Pt applies to feet   IPRATROPIUM-ALBUTEROL (DUONEB) 0.5-2.5 (3) MG/3ML SOLN    Take 3 mLs by nebulization 3 (three) times daily as needed (wheezing).   LEVOTHYROXINE (SYNTHROID, LEVOTHROID) 25 MCG TABLET    Take 1 tablet (25 mcg total) by mouth every morning.   MIRTAZAPINE (REMERON) 45 MG TABLET    Take 45 mg by mouth at bedtime.   OMEPRAZOLE (PRILOSEC) 40 MG CAPSULE    Take 1 capsule (40 mg total) by mouth 2 (two) times daily.   OXYCODONE-ACETAMINOPHEN (PERCOCET/ROXICET) 5-325 MG PER TABLET    Take 1 tablet by mouth every 6 (six) hours as needed. For pain   POTASSIUM CHLORIDE SA  (K-DUR,KLOR-CON) 20 MEQ TABLET    Take 1 tablet (20 mEq total) by mouth daily.   TRAZODONE (DESYREL) 50 MG TABLET    Take 25 mg by mouth at bedtime. Takes 1/2 tablet   TRIMETHOPRIM (TRIMPEX) 100 MG TABLET    Take 100 mg by mouth daily.   Modified Medications   Modified Medication Previous Medication   HYDROCODONE-HOMATROPINE (HYCODAN) 5-1.5 MG/5ML SYRUP HYDROcodone-homatropine (HYCODAN) 5-1.5 MG/5ML syrup      Take 5 mLs by mouth every 6 (six) hours as needed for cough.    Take 5 mLs by mouth every 6 (six) hours as needed for cough.   LORAZEPAM (ATIVAN) 0.5 MG TABLET LORazepam (ATIVAN) 0.5 MG tablet      Take 1 tablet twice a day for anxiety    Take 1 tablet (0.5 mg total) by mouth 2 (two) times daily. For anxiety on schedule AM and PM   OXYCODONE-ACETAMINOPHEN (PERCOCET/ROXICET) 5-325 MG PER TABLET oxyCODONE-acetaminophen (PERCOCET/ROXICET) 5-325 MG per tablet      Take 1 tablet by mouth every 6 (six) hours as needed.    Take 1 tablet by mouth every 6 (six) hours as needed.  Discontinued Medications   No medications on file    Physical Exam:  Filed Vitals:   12/07/12 1139  BP: 116/77  Pulse: 67  Temp: 97.2 F (36.2 C)  Resp: 18   gen- elderly frail lady in NAD, on o2 by nasal canula heent- no pallor, mild muscle wasting present, PERRLA, MMM, neck supple cvs- normal s1,s2, rrr, no murmur respi- b/l equal air entry, rhonchi at the bases on right side, no crackles or wheezing, no use of accessory muscles abdo- soft, non tender, non distended, no guarding Ext- mild muscle wasting, able to move all 4, weakness present Neuro- awake and alert, no focal deficit Skin- intact, no breakdown  Labs reviewed: Basic Metabolic Panel:  Recent Labs  08/65/78 0505 12/02/12 0505 12/04/12 0540  NA 131* 131* 130*  K 4.3 4.8 4.3  CL 93* 92* 90*  CO2 32 34* 33*  GLUCOSE 149* 132* 126*  BUN 23 26* 29*  CREATININE 1.40* 1.30* 1.15*  CALCIUM 8.8 9.1 9.3   Liver Function Tests:  Recent  Labs  06/05/12 0430 07/25/12 1954 11/24/12 1306  AST 21 14 17   ALT 8 9 17   ALKPHOS 71 83 109  BILITOT 0.1* 0.1* 0.5  PROT 5.1* 6.0 6.7  ALBUMIN 2.5* 2.8* 3.1*   CBC:  Recent Labs  07/25/12 1954  11/24/12 1306 11/28/12 1858 11/29/12 0523  WBC 5.5  < > 9.7 7.8 7.9  NEUTROABS 3.9  --  7.8*  --  6.2  HGB 9.6*  < > 11.1* 10.1* 10.2*  HCT 29.3*  < > 33.2* 30.3* 30.9*  MCV 90.7  < >  85.5 84.4 85.1  PLT 252  < > 473.0* 375 346  < > = values in this interval not displayed. Cardiac Enzymes:  Recent Labs  01/13/12 1625  11/29/12 0523 11/29/12 1122 11/29/12 1706  CKTOTAL 119  --   --   --   --   CKMB 5.2*  --   --   --   --   TROPONINI <0.30  < > <0.30 <0.30 <0.30  < > = values in this interval not displayed.  CBG:  Recent Labs  06/09/12 2207 06/10/12 0749 06/10/12 1151  GLUCAP 223* 140* 236*    Radiological Exams: 1. Imaging chest x-ray; Nov 28, 2012, which revealed increased left     pleural effusion.  Cardiac enlargement, mild edema compatible with     congestive heart failure.  Similar appearance of right pleural     effusion. 2. Chest x-ray, November 30, 2012, which revealed left pleural effusion     significantly enlarged compared to prior exam.   Assessment/Plan  Generalized weakness- to work with therapy team. Fall precautions. Continue current pain regimen with oxycodone apap and gabapentin- titrate it if needed for sedation. Aspiration precautions. Encourage po intake  CHF- recent acute exacerbation. Currently symptoms under control. Continue lasix 40 mg daily. Check bmp and continue kcl supplement. Monitor weight  COPD- on o2 by nasal canula and breathing treatment. Continue current regimen  Hyponatremia- check bmp. curreently no neurologic deficit noted  HTN- bp stable. Reviewed medications. Will not make any changes. Continue clonidine and cardizem  afib- rate controlled, continue cardizem  Depression- on remeron and trazodone. Mood stable. Po  intake is good.  gerd- on prilosec 40 mg daily, tolerating it well. Monitor  Anxiety- under control. Continue ativan bid, monitor for sedation  Will continue her thyroid medications, bactrim for uti prophylaxis  Family/ staff Communication: reviewed care plan with patient and nursing supervisor  Labs/tests ordered- cbc, bmp

## 2012-12-08 ENCOUNTER — Ambulatory Visit: Payer: Medicare Other | Admitting: Internal Medicine

## 2013-01-27 ENCOUNTER — Encounter: Payer: Self-pay | Admitting: Adult Health

## 2013-01-27 ENCOUNTER — Non-Acute Institutional Stay (SKILLED_NURSING_FACILITY): Payer: Medicare Other | Admitting: Adult Health

## 2013-01-27 DIAGNOSIS — I4891 Unspecified atrial fibrillation: Secondary | ICD-10-CM

## 2013-01-27 DIAGNOSIS — I509 Heart failure, unspecified: Secondary | ICD-10-CM

## 2013-01-27 DIAGNOSIS — E039 Hypothyroidism, unspecified: Secondary | ICD-10-CM

## 2013-01-27 DIAGNOSIS — F32A Depression, unspecified: Secondary | ICD-10-CM

## 2013-01-27 DIAGNOSIS — J449 Chronic obstructive pulmonary disease, unspecified: Secondary | ICD-10-CM

## 2013-01-27 DIAGNOSIS — J4489 Other specified chronic obstructive pulmonary disease: Secondary | ICD-10-CM

## 2013-01-27 DIAGNOSIS — N390 Urinary tract infection, site not specified: Secondary | ICD-10-CM

## 2013-01-27 DIAGNOSIS — K222 Esophageal obstruction: Secondary | ICD-10-CM

## 2013-01-27 DIAGNOSIS — I10 Essential (primary) hypertension: Secondary | ICD-10-CM

## 2013-01-27 DIAGNOSIS — F323 Major depressive disorder, single episode, severe with psychotic features: Secondary | ICD-10-CM

## 2013-01-27 NOTE — Assessment & Plan Note (Signed)
She is presnelty

## 2013-01-27 NOTE — Assessment & Plan Note (Signed)
Her heart rate is stable will continue diltiazem cd 120 mg daily for rate control

## 2013-01-27 NOTE — Assessment & Plan Note (Signed)
She has not had a recent infection will continue trimpex 100 mg daily and will monitor

## 2013-01-27 NOTE — Progress Notes (Signed)
Patient ID: [name] [name], female   DOB: 07-17-1916, 79 y.o.   MRN: [id number]  [facility]  Allergies  Allergen Reactions  . Prednisone Other (See Comments)    Delirium   . Sertraline Hcl Other (See Comments)    Delirium       Chief Complaint  Patient presents with  . Medical Managment of Chronic Issues    HPI: She is being seen for the management of her chronic illnesses. There are no concerns being voiced by the nursing staff; she is without change in her status at this time.    Past Medical History  Diagnosis Date  . Routine general medical examination at a health care facility   . Other B-complex deficiencies   . Unspecified hypothyroidism   . Diverticulitis of colon (without mention of hemorrhage)   . Congestive heart failure, unspecified   . Stricture and stenosis of esophagus   . Duodenal ulcer, unspecified as acute or chronic, without hemorrhage, perforation, or obstruction   . Atrial fibrillation   . Edema   . Dysphagia, unspecified(787.20)   . Depressive type psychosis   . Sciatica   . Other constipation   . Unspecified essential hypertension   . Arthritis   . Gallstones   . PONV (postoperative nausea and vomiting)     Past Surgical History  Procedure Laterality Date  . Abdominal hysterectomy    . Cholecystectomy      VITAL SIGNS BP 125/60  Pulse 68  Ht 5' (1.524 m)  Wt 109 lb 12.8 oz (49.805 kg)  BMI 21.44 kg/m2   Patient's Medications  New Prescriptions   No medications on file  Previous Medications   ALBUTEROL (PROVENTIL) (2.5 MG/3ML) 0.083% NEBULIZER SOLUTION    Take 2.5 mg by nebulization every 6 (six) hours as needed. For shortness of breath   ALPRAZOLAM (XANAX) 0.25 MG TABLET    Take 0.25 mg by mouth 3 (three) times daily.   BUDESONIDE-FORMOTEROL (SYMBICORT) 80-4.5 MCG/ACT INHALER    Inhale 2 puffs into the lungs 2 (two) times daily.   CLONIDINE (CATAPRES) 0.1 MG TABLET    Take 1 tablet (0.1 mg total) by mouth daily.   DILTIAZEM  (CARDIZEM CD) 120 MG 24 HR CAPSULE    Take 1 capsule (120 mg total) by mouth every morning.   DOCUSATE SODIUM (COLACE) 100 MG CAPSULE    Take 100 mg by mouth at bedtime.   FUROSEMIDE (LASIX) 40 MG TABLET    Take 40 mg by mouth every morning.    HYDROCERIN (EUCERIN) CREA    Apply 1 application topically 2 (two) times daily. Pt applies to feet   IPRATROPIUM-ALBUTEROL (DUONEB) 0.5-2.5 (3) MG/3ML SOLN    Take 3 mLs by nebulization 3 (three) times daily as needed (wheezing).   LEVOTHYROXINE (SYNTHROID, LEVOTHROID) 25 MCG TABLET    Take 1 tablet (25 mcg total) by mouth every morning.   OMEPRAZOLE (PRILOSEC) 40 MG CAPSULE    Take 1 capsule (40 mg total) by mouth 2 (two) times daily.   OXYCODONE-ACETAMINOPHEN (PERCOCET/ROXICET) 5-325 MG PER TABLET    Take 1 tablet by mouth every 6 (six) hours as needed.   POTASSIUM CHLORIDE SA (K-DUR,KLOR-CON) 20 MEQ TABLET    Take 1 tablet (20 mEq total) by mouth daily.   QUETIAPINE (SEROQUEL) 25 MG TABLET    Take 25 mg by mouth at bedtime.   TRIMETHOPRIM (TRIMPEX) 100 MG TABLET    Take 100 mg by mouth daily.   Modified Medications  No medications on file  Discontinued Medications   GABAPENTIN (NEURONTIN) 100 MG CAPSULE    Take 100 mg by mouth at bedtime.    HYDROCODONE-HOMATROPINE (HYCODAN) 5-1.5 MG/5ML SYRUP    Take 5 mLs by mouth every 6 (six) hours as needed for cough.   LORAZEPAM (ATIVAN) 0.5 MG TABLET    Take 1 tablet twice a day for anxiety   MIRTAZAPINE (REMERON) 45 MG TABLET    Take 45 mg by mouth at bedtime.   OXYCODONE-ACETAMINOPHEN (PERCOCET/ROXICET) 5-325 MG PER TABLET    Take 1 tablet by mouth every 6 (six) hours as needed. For pain   TRAZODONE (DESYREL) 50 MG TABLET    Take 25 mg by mouth at bedtime. Takes 1/2 tablet    SIGNIFICANT DIAGNOSTIC EXAMS   11-29-12: wbc 7.9; hgb 10.2; hct 30.9; mcv 85.1; plt 346; tsh 1.379 12-04-12; glucose 126; bun 29; creat 1.15; k+ 4.3; na++ 130  12-07-12: wbc 5.6; hgb 10.4; hct 35.0; mcv 93.1; plt 559; glucose 114; bun  24; creat 1.1; k+4.8 Na++138; tsh 2.790 12-16-12: wbc 9.0; hgb 11.5; hct    Review of Systems  Constitutional: Negative for malaise/fatigue.  Respiratory: Negative for cough and shortness of breath.   Cardiovascular: Negative for chest pain and palpitations.  Gastrointestinal: Negative for heartburn and abdominal pain.  Musculoskeletal: Negative for myalgias.  Skin: Negative.   Neurological: Negative for headaches.  Psychiatric/Behavioral: Negative for depression. The patient does not have insomnia.     Physical Exam  Constitutional:  frail  Neck: Neck supple. No JVD present. No thyromegaly present.  Cardiovascular: Normal rate, regular rhythm and intact distal pulses.   Respiratory: Effort normal and breath sounds normal. No respiratory distress.  GI: Soft. Bowel sounds are normal. She exhibits no distension. There is no tenderness.  Musculoskeletal: She exhibits no edema.  Able to move extremities  Neurological: She is alert.  Skin: Skin is warm and dry.      ASSESSMENT/ PLAN:  HYPERTENSION She is stable will continue clonidine 0.1 mg daily; diltiazem cd 120 mg daily will monitor her status   FIBRILLATION, ATRIAL Her heart rate is stable will continue diltiazem cd 120 mg daily for rate control   CHF, MILD She is stable will continue lasi 40 mg daily with k+ 20 meq daily and will monitor  Unspecified hypothyroidism Will continue synthroid 25 mcg daily and will monitor  Depressive type psychosis She is presently stable is followed by psych services; will continue  seroquel 25 mg every evening; and xanax 0.25 mg three times daily for anxiety and will monitor   ESOPHAGEAL STRICTURE Is stable will continue prilosec 40 mg daily   Wheezing She is presnelty   COPD (chronic obstructive pulmonary disease) She is without change in status will continue O2; will continue symibicort 80/4.5 2 puffs twice daily; albuterol neb every 6 hours as needed and duoneb treatments three  times daily as needed and will monitor  Urinary tract infection, site not specified She has not had a recent infection will continue trimpex 100 mg daily and will monitor

## 2013-01-27 NOTE — Assessment & Plan Note (Signed)
Is stable will continue prilosec 40 mg daily

## 2013-01-27 NOTE — Assessment & Plan Note (Signed)
She is presently stable is followed by psych services; will continue  seroquel 25 mg every evening; and xanax 0.25 mg three times daily for anxiety and will monitor

## 2013-01-27 NOTE — Assessment & Plan Note (Signed)
She is stable will continue lasi 40 mg daily with k+ 20 meq daily and will monitor

## 2013-01-27 NOTE — Assessment & Plan Note (Signed)
She is stable will continue clonidine 0.1 mg daily; diltiazem cd 120 mg daily will monitor her status

## 2013-01-27 NOTE — Assessment & Plan Note (Signed)
She is without change in status will continue O2; will continue symibicort 80/4.5 2 puffs twice daily; albuterol neb every 6 hours as needed and duoneb treatments three times daily as needed and will monitor

## 2013-01-27 NOTE — Assessment & Plan Note (Signed)
Will continue synthroid 25 mcg daily and will monitor

## 2013-03-19 ENCOUNTER — Non-Acute Institutional Stay (SKILLED_NURSING_FACILITY): Payer: Medicare Other | Admitting: Internal Medicine

## 2013-03-19 ENCOUNTER — Encounter: Payer: Self-pay | Admitting: Internal Medicine

## 2013-03-19 DIAGNOSIS — I130 Hypertensive heart and chronic kidney disease with heart failure and stage 1 through stage 4 chronic kidney disease, or unspecified chronic kidney disease: Secondary | ICD-10-CM

## 2013-03-19 DIAGNOSIS — J4489 Other specified chronic obstructive pulmonary disease: Secondary | ICD-10-CM

## 2013-03-19 DIAGNOSIS — N039 Chronic nephritic syndrome with unspecified morphologic changes: Secondary | ICD-10-CM

## 2013-03-19 DIAGNOSIS — K219 Gastro-esophageal reflux disease without esophagitis: Secondary | ICD-10-CM

## 2013-03-19 DIAGNOSIS — I5032 Chronic diastolic (congestive) heart failure: Secondary | ICD-10-CM

## 2013-03-19 DIAGNOSIS — N183 Chronic kidney disease, stage 3 unspecified: Secondary | ICD-10-CM

## 2013-03-19 DIAGNOSIS — I4891 Unspecified atrial fibrillation: Secondary | ICD-10-CM

## 2013-03-19 DIAGNOSIS — E039 Hypothyroidism, unspecified: Secondary | ICD-10-CM

## 2013-03-19 DIAGNOSIS — F32A Depression, unspecified: Secondary | ICD-10-CM

## 2013-03-19 DIAGNOSIS — J449 Chronic obstructive pulmonary disease, unspecified: Secondary | ICD-10-CM

## 2013-03-19 DIAGNOSIS — F323 Major depressive disorder, single episode, severe with psychotic features: Secondary | ICD-10-CM

## 2013-03-19 NOTE — Progress Notes (Signed)
Patient ID: [name] [name], female   DOB: [birth year], 79 y.o.   MRN: [id number]  [facility]   Code Status: DNR  Allergies  Allergen Reactions  . Prednisone Other (See Comments)    Delirium   . Sertraline Hcl Other (See Comments)    Delirium     Chief Complaint  Patient presents with  . Medical Managment of Chronic Issues     HPI:  79 y/o female patient is a long term care resident here. She is at her baseline. Current adjustment in her xanax has kept her anxiety stable. She is on continuous o2 by nasal canula. She is incontinent with her bowel and bladder habits. She has wound/ skin tear from trauma through wheelchair on her arms and legs. Left shin skin tear and right arm skin bruising persists. She denies any complaints. No new concerns from staff  Review of Systems:  Denies fever or chills Denies nausea or vomiting, appetite is good Denies chest pain Breathing is comfortable with oxygen No diarrhea or constipation No falls reported  Past Medical History  Diagnosis Date  . Routine general medical examination at a health care facility   . Other B-complex deficiencies   . Unspecified hypothyroidism   . Diverticulitis of colon (without mention of hemorrhage)   . Congestive heart failure, unspecified   . Stricture and stenosis of esophagus   . Duodenal ulcer, unspecified as acute or chronic, without hemorrhage, perforation, or obstruction   . Atrial fibrillation   . Edema   . Dysphagia, unspecified(787.20)   . Depressive type psychosis   . Sciatica   . Other constipation   . Unspecified essential hypertension   . Arthritis   . Gallstones   . PONV (postoperative nausea and vomiting)    Past Surgical History  Procedure Laterality Date  . Abdominal hysterectomy    . Cholecystectomy     Social History:   reports that she has never smoked. She quit smokeless tobacco use about 49 years ago. Her smokeless tobacco use included Snuff. She reports that she  does not drink alcohol or use illicit drugs.  Family History  Problem Relation Age of Onset  . Cancer Other     breast   . Heart disease Other     Medications: Patient's Medications  New Prescriptions   No medications on file  Previous Medications   ALBUTEROL (PROVENTIL) (2.5 MG/3ML) 0.083% NEBULIZER SOLUTION    Take 2.5 mg by nebulization every 6 (six) hours as needed. For shortness of breath   ALPRAZOLAM (XANAX) 0.25 MG TABLET    Take 0.25 mg by mouth 3 (three) times daily.   BUDESONIDE-FORMOTEROL (SYMBICORT) 80-4.5 MCG/ACT INHALER    Inhale 2 puffs into the lungs 2 (two) times daily.   CHOLECALCIFEROL (VITAMIN D-3) 1000 UNITS CAPS    Take 1 capsule by mouth daily.   CLONIDINE (CATAPRES) 0.1 MG TABLET    Take 1 tablet (0.1 mg total) by mouth daily.   DILTIAZEM (CARDIZEM CD) 120 MG 24 HR CAPSULE    Take 1 capsule (120 mg total) by mouth every morning.   DOCUSATE SODIUM (COLACE) 100 MG CAPSULE    Take 100 mg by mouth at bedtime.   FUROSEMIDE (LASIX) 40 MG TABLET    Take 40 mg by mouth every morning.    GABAPENTIN (NEURONTIN) 100 MG CAPSULE    Take 100 mg by mouth at bedtime.   IPRATROPIUM-ALBUTEROL (DUONEB) 0.5-2.5 (3) MG/3ML SOLN    Take 3 mLs by  nebulization 3 (three) times daily as needed (wheezing).   LEVOTHYROXINE (SYNTHROID, LEVOTHROID) 25 MCG TABLET    Take 1 tablet (25 mcg total) by mouth every morning.   MIRTAZAPINE (REMERON) 45 MG TABLET    Take 45 mg by mouth at bedtime.   OMEPRAZOLE (PRILOSEC) 40 MG CAPSULE    Take 1 capsule (40 mg total) by mouth 2 (two) times daily.   OXYCODONE-ACETAMINOPHEN (PERCOCET/ROXICET) 5-325 MG PER TABLET    Take 1 tablet by mouth every 6 (six) hours as needed.   QUETIAPINE (SEROQUEL) 25 MG TABLET    Take 12.5 mg by mouth at bedtime.    TRAZODONE (DESYREL) 50 MG TABLET    Take 25 mg by mouth at bedtime.   TRIMETHOPRIM (TRIMPEX) 100 MG TABLET    Take 100 mg by mouth daily.   Modified Medications   No medications on file  Discontinued Medications    HYDROCERIN (EUCERIN) CREA    Apply 1 application topically 2 (two) times daily. Pt applies to feet   POTASSIUM CHLORIDE SA (K-DUR,KLOR-CON) 20 MEQ TABLET    Take 1 tablet (20 mEq total) by mouth daily.     Physical Exam: Filed Vitals:   03/19/13 1520  BP: 146/60  Pulse: 81  Temp: 96.7 F (35.9 C)  Resp: 18  SpO2: 95%   Constitutional:  Frail, chronically ill appearing Neck: Neck supple. No JVD present. No thyromegaly present.  Cardiovascular: Normal rate, regular rhythm and intact distal pulses.   Respiratory: Effort normal and breath sounds normal. No respiratory distress.  GI: Soft. Bowel sounds are normal. She exhibits no distension. There is no tenderness.  Musculoskeletal: She exhibits no edema.  Able to move extremities. Chronic skin changes s/o PVDs. Neurological: She is alert and conversational and can make her needs known Skin: Skin is warm and dry. Skin tear in left shin and bruising in right forearm  Labs reviewed: Basic Metabolic Panel:  Recent Labs  65/78/46 0505 12/02/12 0505 12/04/12 0540  NA 131* 131* 130*  K 4.3 4.8 4.3  CL 93* 92* 90*  CO2 32 34* 33*  GLUCOSE 149* 132* 126*  BUN 23 26* 29*  CREATININE 1.40* 1.30* 1.15*  CALCIUM 8.8 9.1 9.3   Liver Function Tests:  Recent Labs  06/05/12 0430 07/25/12 1954 11/24/12 1306  AST 21 14 17   ALT 8 9 17   ALKPHOS 71 83 109  BILITOT 0.1* 0.1* 0.5  PROT 5.1* 6.0 6.7  ALBUMIN 2.5* 2.8* 3.1*   CBC:  Recent Labs  07/25/12 1954  11/24/12 1306 11/28/12 1858 11/29/12 0523  WBC 5.5  < > 9.7 7.8 7.9  NEUTROABS 3.9  --  7.8*  --  6.2  HGB 9.6*  < > 11.1* 10.1* 10.2*  HCT 29.3*  < > 33.2* 30.3* 30.9*  MCV 90.7  < > 85.5 84.4 85.1  PLT 252  < > 473.0* 375 346  < > = values in this interval not displayed. Cardiac Enzymes:  Recent Labs  11/29/12 0523 11/29/12 1122 11/29/12 1706  TROPONINI <0.30 <0.30 <0.30   CBG:  Recent Labs  06/09/12 2207 06/10/12 0749 06/10/12 1151  GLUCAP 223*  140* 236*     Assessment/Plan  HYPERTENSION Blood pressure is controlled. Continue clonidine 0.1 mg daily; diltiazem cd 120 mg daily and check her bp  COPD continue O2 with symibicort 2 puffs twice daily with duoneb and prn albuterol. uptodate with pneumococcal vaccine. Will be getting influenza vaccine this flu season  psychosis stable on xanax,  followed by psych services. Also continue trazodone 50 mg daily and seroquel 12. 5 mg daily. Continue remeron  ATRIAL FIBRILLATION Her heart rate is controlled with diltiazem cd 120 mg daily. Not on any anticoagulation given her fall risk  Chronic diastolic heart failure euvolemic at present. Continue lasix 40 mg daily with k+ 20 meq daily. Not on ACEI given her renal impairment  hypothyroidism continue synthroid 25 mcg daily and will monitor  Recurrent cystitis Remains symptom free, continue trimpex 100 mg daily and will monitor   ckd stage 3 In setting of htn (long term). Monitor clinically. Check bmp   Labs/tests ordered

## 2013-04-19 ENCOUNTER — Non-Acute Institutional Stay (SKILLED_NURSING_FACILITY): Payer: Medicare Other | Admitting: Internal Medicine

## 2013-04-19 ENCOUNTER — Encounter: Payer: Self-pay | Admitting: Internal Medicine

## 2013-04-19 DIAGNOSIS — N039 Chronic nephritic syndrome with unspecified morphologic changes: Secondary | ICD-10-CM

## 2013-04-19 DIAGNOSIS — E039 Hypothyroidism, unspecified: Secondary | ICD-10-CM

## 2013-04-19 DIAGNOSIS — N3090 Cystitis, unspecified without hematuria: Secondary | ICD-10-CM

## 2013-04-19 DIAGNOSIS — I4891 Unspecified atrial fibrillation: Secondary | ICD-10-CM

## 2013-04-19 DIAGNOSIS — J4489 Other specified chronic obstructive pulmonary disease: Secondary | ICD-10-CM

## 2013-04-19 DIAGNOSIS — I130 Hypertensive heart and chronic kidney disease with heart failure and stage 1 through stage 4 chronic kidney disease, or unspecified chronic kidney disease: Secondary | ICD-10-CM

## 2013-04-19 DIAGNOSIS — K219 Gastro-esophageal reflux disease without esophagitis: Secondary | ICD-10-CM

## 2013-04-19 DIAGNOSIS — J449 Chronic obstructive pulmonary disease, unspecified: Secondary | ICD-10-CM

## 2013-04-19 DIAGNOSIS — F323 Major depressive disorder, single episode, severe with psychotic features: Secondary | ICD-10-CM

## 2013-04-19 DIAGNOSIS — N183 Chronic kidney disease, stage 3 unspecified: Secondary | ICD-10-CM

## 2013-04-19 DIAGNOSIS — I5032 Chronic diastolic (congestive) heart failure: Secondary | ICD-10-CM

## 2013-04-19 DIAGNOSIS — G608 Other hereditary and idiopathic neuropathies: Secondary | ICD-10-CM

## 2013-04-19 DIAGNOSIS — F32A Depression, unspecified: Secondary | ICD-10-CM

## 2013-04-19 DIAGNOSIS — G609 Hereditary and idiopathic neuropathy, unspecified: Secondary | ICD-10-CM

## 2013-04-19 NOTE — Progress Notes (Signed)
Patient ID: [name] [name], female   DOB: [birth year], 79 y.o.   MRN: [id number]  [facility]   Code Status: DNR  Allergies  Allergen Reactions  . Prednisone Other (See Comments)    Delirium   . Sertraline Hcl Other (See Comments)    Delirium    Chief Complaint  Patient presents with  . Medical Managment of Chronic Issues   HPI:   79 y/o female patient is a long term care resident here. She is at her baseline. She is followed by psych services and mood has been more stable. She is on continuous o2 by nasal canula. She is incontinent with her bowel and bladder habits. She denies any complaints. No new concerns from staff  Review of Systems:  Denies fever or chills Denies nausea or vomiting, appetite is good Denies chest pain Breathing is comfortable with oxygen No diarrhea or constipation No falls reported No new skin concern Weight has been stable  Past Medical History  Diagnosis Date  . Routine general medical examination at a health care facility   . Other B-complex deficiencies   . Unspecified hypothyroidism   . Diverticulitis of colon (without mention of hemorrhage)   . Congestive heart failure, unspecified   . Stricture and stenosis of esophagus   . Duodenal ulcer, unspecified as acute or chronic, without hemorrhage, perforation, or obstruction   . Atrial fibrillation   . Edema   . Dysphagia, unspecified(787.20)   . Depressive type psychosis   . Sciatica   . Other constipation   . Unspecified essential hypertension   . Arthritis   . Gallstones   . PONV (postoperative nausea and vomiting)    Past Surgical History  Procedure Laterality Date  . Abdominal hysterectomy    . Cholecystectomy     Medication reviewed. See [facility]  Physical exam  Vitals reviewed and nursing report reviewed  Constitutional:  Frail, chronically ill appearing Neck: Neck supple. No JVD present. No thyromegaly present.   Cardiovascular: Normal rate, regular rhythm and  intact distal pulses.    Respiratory: Effort normal and breath sounds normal. No respiratory distress.   GI: Soft. Bowel sounds are normal. She exhibits no distension. There is no tenderness.  Musculoskeletal: She exhibits no edema.  Able to move extremities. Chronic skin changes s/o PVDs. Neurological: She is alert and conversational and can make her needs known Skin: Skin is warm and dry.   Labs reviewed  03/16/13 wbc 5.7, hb 11.3, hct 37.5, plt 301, mcv 95.9, na 138, k 4, bun 28, cr 1.2, glu 105, ca 9.3, a1c 6.3, lft wnl  Assessment/Plan  ATRIAL FIBRILLATION Her heart rate is controlled with diltiazem cd 120 mg daily. Not on any anticoagulation given her fall risk  HYPERTENSION Blood pressure is controlled. Continue clonidine 0.1 mg daily; diltiazem cd 120 mg daily and lasix 40 mg daily and check her bp  hypothyroidism continue synthroid 25 mcg daily and will monitor  Chronic diastolic heart failure euvolemic at present. Continue lasix 40 mg daily with k+ 20 meq daily. Not on ACEI given her renal impairment  Recurrent cystitis Remains symptom free, continue trimpex 100 mg daily and will monitor   gerd Continue prilosec and monitor symptoms  Peripheral neuropathy Continue neurontin 100 mg at bedtime and percocet 5/325 every 6 hour as needed for pain  COPD continue O2 with symibicort 2 puffs twice daily with duoneb and prn albuterol. uptodate with pneumococcal vaccine and influenza vaccine this flu season  psychosis stable  on xanax, followed by psych services. Also continue trazodone 50 mg daily and seroquel 25 mg daily. Continue remeron. seroquel dose recently increased.  ckd stage 3 In setting of htn (long term). Monitor clinically. Check bmp

## 2013-05-06 ENCOUNTER — Encounter: Payer: Self-pay | Admitting: Internal Medicine

## 2013-05-06 ENCOUNTER — Non-Acute Institutional Stay (SKILLED_NURSING_FACILITY): Payer: Medicare Other | Admitting: Internal Medicine

## 2013-05-06 DIAGNOSIS — E039 Hypothyroidism, unspecified: Secondary | ICD-10-CM

## 2013-05-06 DIAGNOSIS — I10 Essential (primary) hypertension: Secondary | ICD-10-CM

## 2013-05-06 DIAGNOSIS — F323 Major depressive disorder, single episode, severe with psychotic features: Secondary | ICD-10-CM

## 2013-05-06 DIAGNOSIS — I4891 Unspecified atrial fibrillation: Secondary | ICD-10-CM

## 2013-05-06 NOTE — Progress Notes (Signed)
Patient ID: [name] [name], female   DOB: [birth year], 79 y.o.   MRN: [id number]  [facility]  Code Status: DNR    CC- medical management of chronic issues  HPI:   79 y/o female patient is a long term care resident here. She is at her baseline, on continuous o2 by nasal canula. She is incontinent with her bowel and bladder habits. She is followed by psych services. She denies any complaints. No new concerns from staff regarding skin or behavior  Review of Systems:  Denies fever or chills Denies nausea or vomiting, appetite is good Denies chest pain or dyspnea No diarrhea or constipation No falls reported  Past Medical History  Diagnosis Date  . Routine general medical examination at a health care facility   . Other B-complex deficiencies   . Unspecified hypothyroidism   . Diverticulitis of colon (without mention of hemorrhage)   . Congestive heart failure, unspecified   . Stricture and stenosis of esophagus   . Duodenal ulcer, unspecified as acute or chronic, without hemorrhage, perforation, or obstruction   . Atrial fibrillation   . Edema   . Dysphagia, unspecified(787.20)   . Depressive type psychosis   . Sciatica   . Other constipation   . Unspecified essential hypertension   . Arthritis   . Gallstones   . PONV (postoperative nausea and vomiting)    Past Surgical History  Procedure Laterality Date  . Abdominal hysterectomy    . Cholecystectomy     Medication reviewed. See [facility]  Physical exam  Vitals reviewed and nursing report reviewed  Constitutional:  Frail, chronically ill appearing Neck: Neck supple. No JVD present. No thyromegaly present.   Cardiovascular: Normal rate, regular rhythm and intact distal pulses.    Respiratory: Effort normal and breath sounds normal. No respiratory distress.   GI: Soft. Bowel sounds are normal. She exhibits no distension. There is no tenderness.  Musculoskeletal: She exhibits no edema.  Able to move  extremities. Chronic skin changes s/o PVDs. Skin tears Neurological: She is alert and conversational and can make her needs known Skin: Skin is warm and dry.   Labs reviewed  03/16/13 wbc 5.7, hb 11.3, hct 37.5, plt 301, mcv 95.9, na 138, k 4, bun 28, cr 1.2, glu 105, ca 9.3, a1c 6.3, lft wnl   Assessment/Plan  psychosis stable on xanax with trazodone 50 mg daily and seroquel 25 mg daily. Continue remeron. Follow psych recs  ATRIAL FIBRILLATION Her heart rate is controlled with diltiazem cd 120 mg daily. Not on any anticoagulation given her fall risk  HYPERTENSION Blood pressure is controlled. Continue clonidine 0.1 mg daily; diltiazem cd 120 mg daily and lasix 40 mg daily. Monitor bmp  hypothyroidism continue synthroid 25 mcg daily. Check tsh level

## 2013-05-11 ENCOUNTER — Other Ambulatory Visit: Payer: Self-pay | Admitting: *Deleted

## 2013-05-11 MED ORDER — ALPRAZOLAM 0.25 MG PO TABS: 0.2500 mg | ORAL_TABLET | Freq: Three times a day (TID) | ORAL | Status: DC

## 2013-05-15 ENCOUNTER — Other Ambulatory Visit: Payer: Self-pay

## 2013-05-15 LAB — URINALYSIS, COMPLETE
Bilirubin,UR: NEGATIVE
Glucose,UR: NEGATIVE mg/dL (ref 0–75)
Hyaline Cast: 11
Ketone: NEGATIVE
Nitrite: NEGATIVE
Protein: NEGATIVE
Specific Gravity: 1.015 (ref 1.003–1.030)
WBC UR: 1 /HPF (ref 0–5)

## 2013-06-09 ENCOUNTER — Other Ambulatory Visit: Payer: Self-pay | Admitting: *Deleted

## 2013-06-09 MED ORDER — ALPRAZOLAM 0.25 MG PO TABS: ORAL_TABLET | ORAL | Status: DC

## 2013-06-17 ENCOUNTER — Non-Acute Institutional Stay (SKILLED_NURSING_FACILITY): Payer: Medicare Other | Admitting: Internal Medicine

## 2013-06-17 DIAGNOSIS — D509 Iron deficiency anemia, unspecified: Secondary | ICD-10-CM

## 2013-06-17 DIAGNOSIS — I10 Essential (primary) hypertension: Secondary | ICD-10-CM

## 2013-06-17 DIAGNOSIS — I4891 Unspecified atrial fibrillation: Secondary | ICD-10-CM

## 2013-06-17 DIAGNOSIS — E039 Hypothyroidism, unspecified: Secondary | ICD-10-CM

## 2013-06-17 DIAGNOSIS — F29 Unspecified psychosis not due to a substance or known physiological condition: Secondary | ICD-10-CM

## 2013-06-17 DIAGNOSIS — E538 Deficiency of other specified B group vitamins: Secondary | ICD-10-CM

## 2013-06-23 NOTE — Progress Notes (Signed)
Patient ID: [name] [name], female   DOB: [birth year], 79 y.o.   MRN: [id number]     [facility]  Code Status: DNR    CC- medical management of chronic issues  HPI:   elderly female patient is a long term care resident here. She is at her baseline, on continuous o2 by nasal canula. She is incontinent with her bowel and bladder habits. She is followed by psych services. She denies any complaints. No new concerns from staff   Review of Systems:  Denies fever or chills Denies nausea or vomiting, appetite is good Denies chest pain or dyspnea No diarrhea or constipation No falls reported    Past Medical History  Diagnosis Date  . Routine general medical examination at a health care facility   . Other B-complex deficiencies   . Unspecified hypothyroidism   . Diverticulitis of colon (without mention of hemorrhage)   . Congestive heart failure, unspecified   . Stricture and stenosis of esophagus   . Duodenal ulcer, unspecified as acute or chronic, without hemorrhage, perforation, or obstruction   . Atrial fibrillation   . Edema   . Dysphagia, unspecified(787.20)   . Depressive type psychosis   . Sciatica   . Other constipation   . Unspecified essential hypertension   . Arthritis   . Gallstones   . PONV (postoperative nausea and vomiting)    Past Surgical History  Procedure Laterality Date  . Abdominal hysterectomy    . Cholecystectomy      Medication reviewed. See [facility]  Physical exam BP 142/80  Pulse 60  Temp(Src) 97.6 F (36.4 C)  Resp 18  SpO2 96%  Vitals reviewed and nursing report reviewed  Constitutional: Frail, chronically ill appearing Neck: Neck supple. No JVD present. No thyromegaly present.   Cardiovascular: Normal rate, regular rhythm and intact distal pulses.    Respiratory: Effort normal and breath sounds normal. No respiratory distress.   GI: Soft. Bowel sounds are normal. She exhibits no distension. There is no tenderness.    Musculoskeletal: She exhibits no edema. Able to move extremities. Chronic skin changes s/o PVD.Skin tears Neurological: She is alert and conversational and can make her needs known Skin: Skin is warm and dry.   Labs reviewed  03/16/13 wbc 5.7, hb 11.3, hct 37.5, plt 301, mcv 95.9, na 138, k 4, bun 28, cr 1.2, glu 105, ca 9.3, a1c 6.3, lft wnl   Assessment/Plan  ATRIAL FIBRILLATION controlled heart rate with diltiazem cd 120 mg daily. Off anticoagulation. Monitor clinically  HYPERTENSION Controlled bp readings. continue clonidine 0.1 mg daily, diltiazem cd 120 mg daily and lasix 40 mg daily.Monitor bmp  Iron deficiency anemia Continue iron supplement. Monitor h/h  Vitamin b12 deficiency Continue b12 supplement  psychosis Stable currently on xanax with trazodone 50 mg daily and seroquel 25 mg daily. Continue remeron. Follow psych recs  hypothyroidism continue synthroid 25 mcg daily

## 2013-07-15 ENCOUNTER — Other Ambulatory Visit: Payer: Self-pay | Admitting: *Deleted

## 2013-07-15 MED ORDER — OXYCODONE-ACETAMINOPHEN 5-325 MG PO TABS: ORAL_TABLET | ORAL | Status: DC

## 2013-07-19 ENCOUNTER — Encounter: Payer: Self-pay | Admitting: Internal Medicine

## 2013-07-19 ENCOUNTER — Non-Acute Institutional Stay (SKILLED_NURSING_FACILITY): Payer: Medicare Other | Admitting: Internal Medicine

## 2013-07-19 DIAGNOSIS — K219 Gastro-esophageal reflux disease without esophagitis: Secondary | ICD-10-CM

## 2013-07-19 DIAGNOSIS — E039 Hypothyroidism, unspecified: Secondary | ICD-10-CM

## 2013-07-19 DIAGNOSIS — F323 Major depressive disorder, single episode, severe with psychotic features: Secondary | ICD-10-CM

## 2013-07-19 DIAGNOSIS — E538 Deficiency of other specified B group vitamins: Secondary | ICD-10-CM

## 2013-07-19 DIAGNOSIS — IMO0002 Reserved for concepts with insufficient information to code with codable children: Secondary | ICD-10-CM

## 2013-07-19 DIAGNOSIS — I4891 Unspecified atrial fibrillation: Secondary | ICD-10-CM

## 2013-07-19 DIAGNOSIS — I10 Essential (primary) hypertension: Secondary | ICD-10-CM

## 2013-07-19 DIAGNOSIS — F32A Depression, unspecified: Secondary | ICD-10-CM

## 2013-07-19 DIAGNOSIS — M792 Neuralgia and neuritis, unspecified: Secondary | ICD-10-CM | POA: Insufficient documentation

## 2013-07-19 NOTE — Progress Notes (Signed)
Patient ID: Durwin NoraArdeil H Schriver, female   DOB: 07/23/1916, 80 y.o.   MRN: [id number]    [facility]  Code Status: DNR    CC- medical management of chronic issues  HPI:   80 y/o female patient is a long term care resident here. She recently completed rx for herpes zoster and is still recieveing prn pain meds. She is lying in bed, in no acute distress. No new concern. She is at her baseline, on continuous o2 by nasal canula. She is incontinent with her bowel and bladder habits. She is followed by psych services. She denies any complaints.   Review of Systems:  Denies fever or chills Denies nausea or vomiting, appetite is good Denies chest pain or dyspnea No diarrhea or constipation No falls reported    [facility]  Physical exam  Vitals reviewed and nursing report reviewed BP 150/60  Pulse 88  Temp(Src) 98 F (36.7 C)  Resp 16  SpO2 96%   Constitutional:  Frail, chronically ill appearing Neck: Neck supple. No JVD present. No thyromegaly present.   Cardiovascular: Normal rate, regular rhythm and intact distal pulses.    Respiratory: Effort normal and breath sounds normal. No respiratory distress. On o2 by [place]  GI: Soft. Bowel sounds are normal. She exhibits no distension. There is no tenderness.  Musculoskeletal: She exhibits no edema.  Able to move extremities. Chronic skin changes s/o PVDs. Skin tears. Self propels in wheelcahir Neurological: She is alert and conversational and can make her needs known Skin: Skin is warm and dry.   Labs reviewed  03/16/13 wbc 5.7, hb 11.3, hct 37.5, plt 301, mcv 95.9, na 138, k 4, bun 28, cr 1.2, glu 105, ca 9.3, a1c 6.3, lft wnl 05/13/13 wbc 5.5, hb 9.7, hct 28.9, plt 240, na 138, k 4.7, bun 26, cr 1.1, glu 197, ca 9.1   Assessment/Plan  ATRIAL FIBRILLATION Her heart rate is controlled with diltiazem cd 120 mg daily. Not on any anticoagulation given her fall risk  HYPERTENSION Blood pressure is  controlled. Continue clonidine 0.1 mg daily; diltiazem cd 120 mg daily and lasix 40 mg daily. Monitor bmp  psychosis Stable at present. Followed by psych services. Continue remeron 30 mg daily and trazodone 25 mg daily.    hypothyroidism continue synthroid 25 mcg daily. Check tsh level  GERD Continue prilosec 40 mg daily for now. Consider decreasing the dose next visit  Peripheral neuropathic pain Continue her gabapentin  b12 deficiency Continue b12 supplement. Low b12 level

## 2013-08-02 LAB — HM DIABETES EYE EXAM

## 2013-08-04 DIAGNOSIS — D509 Iron deficiency anemia, unspecified: Secondary | ICD-10-CM | POA: Insufficient documentation

## 2013-08-04 DIAGNOSIS — F29 Unspecified psychosis not due to a substance or known physiological condition: Secondary | ICD-10-CM | POA: Insufficient documentation

## 2013-08-16 ENCOUNTER — Non-Acute Institutional Stay (SKILLED_NURSING_FACILITY): Payer: Medicare Other | Admitting: Internal Medicine

## 2013-08-16 ENCOUNTER — Encounter: Payer: Self-pay | Admitting: Internal Medicine

## 2013-08-16 DIAGNOSIS — M792 Neuralgia and neuritis, unspecified: Secondary | ICD-10-CM

## 2013-08-16 DIAGNOSIS — IMO0002 Reserved for concepts with insufficient information to code with codable children: Secondary | ICD-10-CM

## 2013-08-16 DIAGNOSIS — I4891 Unspecified atrial fibrillation: Secondary | ICD-10-CM

## 2013-08-16 DIAGNOSIS — F29 Unspecified psychosis not due to a substance or known physiological condition: Secondary | ICD-10-CM

## 2013-08-16 DIAGNOSIS — K219 Gastro-esophageal reflux disease without esophagitis: Secondary | ICD-10-CM

## 2013-08-16 NOTE — Progress Notes (Signed)
Patient ID: [name] [name], female   DOB: 04/04/1917, 80 y.o.   MRN: [id number]    [name] [name]  Code Status: DNR    CC- medical management of chronic issues  HPI:   80 y/o female patient is a long term [name] resident here. No new concern. She is at her baseline, on continuous o2 by nasal canula. She is incontinent with her bowel and bladder habits. She is followed by psych services. She denies any complaints.   Review of Systems:  Denies fever or chills Denies nausea or vomiting, appetite is good Denies chest pain or dyspnea No diarrhea or constipation No falls reported    Medication reviewed. See [name] [name]  Physical exam  Vitals reviewed and nursing report reviewed BP 117/62  Pulse 78  Temp(Src) 98 F (36.7 C)  Resp 16  SpO2 96%  Constitutional:  Frail, chronically ill appearing Neck: Neck supple. No JVD present. No thyromegaly present.   Cardiovascular: Normal rate, regular rhythm and intact distal pulses.    Respiratory: Effort normal and breath sounds normal. No respiratory distress. On o2 by [name] [name]  GI: Soft. Bowel sounds are normal. She exhibits no distension. There is no tenderness.  Musculoskeletal: She exhibits no edema.  Able to move extremities. Chronic skin changes s/o PVDs. Skin tears. Self propels in wheelcahir Neurological: She is alert and conversational and can make her needs known Skin: Skin is warm and dry.   Labs reviewed  03/16/13 wbc 5.7, hb 11.3, hct 37.5, plt 301, mcv 95.9, na 138, k 4, bun 28, cr 1.2, glu 105, ca 9.3, a1c 6.3, lft wnl 05/13/13 wbc 5.5, hb 9.7, hct 28.9, plt 240, na 138, k 4.7, bun 26, cr 1.1, glu 197, ca 9.1   Assessment/Plan  psychosis Stable at present. Followed by psych services. seroquel recently reduced from 50 mg daily to 25 mg daily.Continue remeron 30 mg daily and trazodone 25 mg daily.    ATRIAL FIBRILLATION Her heart rate is controlled with diltiazem cd 120 mg daily. Not on any anticoagulation given her fall  risk  GERD Continue prilosec 40 mg daily for now.   Peripheral neuropathic pain Continue her gabapentin   Oneal GroutMAHIMA Reneka Nebergall, MD  [facility] [phone] (Monday-Friday 8 am - 5 pm) [phone] (afterhours)

## 2013-09-06 ENCOUNTER — Encounter: Payer: Self-pay | Admitting: Internal Medicine

## 2013-09-06 ENCOUNTER — Non-Acute Institutional Stay (SKILLED_NURSING_FACILITY): Payer: Medicare Other | Admitting: Internal Medicine

## 2013-09-06 DIAGNOSIS — I4891 Unspecified atrial fibrillation: Secondary | ICD-10-CM

## 2013-09-06 DIAGNOSIS — F0392 Unspecified dementia, unspecified severity, with psychotic disturbance: Secondary | ICD-10-CM

## 2013-09-06 DIAGNOSIS — G609 Hereditary and idiopathic neuropathy, unspecified: Secondary | ICD-10-CM

## 2013-09-06 DIAGNOSIS — F0390 Unspecified dementia without behavioral disturbance: Secondary | ICD-10-CM

## 2013-09-06 DIAGNOSIS — G629 Polyneuropathy, unspecified: Secondary | ICD-10-CM

## 2013-09-06 DIAGNOSIS — G608 Other hereditary and idiopathic neuropathies: Secondary | ICD-10-CM

## 2013-09-06 DIAGNOSIS — E538 Deficiency of other specified B group vitamins: Secondary | ICD-10-CM

## 2013-09-06 DIAGNOSIS — I10 Essential (primary) hypertension: Secondary | ICD-10-CM

## 2013-09-06 DIAGNOSIS — F29 Unspecified psychosis not due to a substance or known physiological condition: Secondary | ICD-10-CM

## 2013-09-06 DIAGNOSIS — F22 Delusional disorders: Principal | ICD-10-CM

## 2013-09-06 DIAGNOSIS — F339 Major depressive disorder, recurrent, unspecified: Secondary | ICD-10-CM | POA: Insufficient documentation

## 2013-09-06 DIAGNOSIS — N3090 Cystitis, unspecified without hematuria: Secondary | ICD-10-CM

## 2013-09-06 NOTE — Progress Notes (Signed)
Patient ID: [name] [name], female   DOB: [birth year], 80 y.o.   MRN: [id number]    [facility]  Code Status: DNR    CC- medical management of chronic issues  HPI:   80 y/o female patient is a long term care resident here. Her mood has been stable with change in her medication. Tolerating increased dose of seroquel well. Her zoster has resolved. Has confusion at baseline and this makes history taking and ROS difficult. Continues to self propel in wheelchair. No new concern. She is at her baseline, on continuous o2 by nasal canula.   Review of Systems:  Denies fever or chills Denies nausea or vomiting, appetite is good Denies chest pain or dyspnea No diarrhea or constipation No falls reported    Past Medical History  Diagnosis Date  . Routine general medical examination at a health care facility   . Other B-complex deficiencies   . Unspecified hypothyroidism   . Diverticulitis of colon (without mention of hemorrhage)   . Congestive heart failure, unspecified   . Stricture and stenosis of esophagus   . Duodenal ulcer, unspecified as acute or chronic, without hemorrhage, perforation, or obstruction   . Atrial fibrillation   . Edema   . Dysphagia, unspecified(787.20)   . Depressive type psychosis   . Sciatica   . Other constipation   . Unspecified essential hypertension   . Arthritis   . Gallstones   . PONV (postoperative nausea and vomiting)    Past Surgical History  Procedure Laterality Date  . Abdominal hysterectomy    . Cholecystectomy     Current Outpatient Prescriptions on File Prior to Visit  Medication Sig Dispense Refill  . albuterol (PROVENTIL) (2.5 MG/3ML) 0.083% nebulizer solution Take 2.5 mg by nebulization every 6 (six) hours as needed. For shortness of breath      . ALPRAZolam (XANAX) 0.25 MG tablet Take one tablet by mouth three times daily for anxiety. Hold for sedation; Take one tablet by mouth twice daily as needed for anxiety  150 tablet  5   . budesonide-formoterol (SYMBICORT) 80-4.5 MCG/ACT inhaler Inhale 2 puffs into the lungs 2 (two) times daily.      . Cholecalciferol (VITAMIN D-3) 1000 UNITS CAPS Take 1 capsule by mouth daily.      . cloNIDine (CATAPRES) 0.1 MG tablet Take 1 tablet (0.1 mg total) by mouth daily.  30 tablet  5  . diltiazem (CARDIZEM CD) 120 MG 24 hr capsule Take 1 capsule (120 mg total) by mouth every morning.  30 capsule  6  . docusate sodium (COLACE) 100 MG capsule Take 100 mg by mouth at bedtime.      . furosemide (LASIX) 40 MG tablet Take 40 mg by mouth every morning.       . gabapentin (NEURONTIN) 100 MG capsule Take 100 mg by mouth at bedtime.      Marland Kitchen ipratropium-albuterol (DUONEB) 0.5-2.5 (3) MG/3ML SOLN Take 3 mLs by nebulization 3 (three) times daily as needed (wheezing).      Marland Kitchen levothyroxine (SYNTHROID, LEVOTHROID) 25 MCG tablet Take 1 tablet (25 mcg total) by mouth every morning.  30 tablet  5  . mirtazapine (REMERON) 45 MG tablet Take 30 mg by mouth at bedtime.       Marland Kitchen omeprazole (PRILOSEC) 40 MG capsule Take 1 capsule (40 mg total) by mouth 2 (two) times daily.  60 capsule  5  . oxyCODONE-acetaminophen (PERCOCET/ROXICET) 5-325 MG per tablet Take one tablet by mouth every  6 hours as needed for moderate to severe pain  120 tablet  0  . QUEtiapine (SEROQUEL) 25 MG tablet Take 50 mg by mouth at bedtime.       . traZODone (DESYREL) 50 MG tablet Take 25 mg by mouth at bedtime as needed.       . trimethoprim (TRIMPEX) 100 MG tablet Take 100 mg by mouth daily.        No current facility-administered medications on file prior to visit.    Physical exam BP 131/57  Pulse 64  Temp(Src) 97 F (36.1 C)  Resp 18  SpO2 95%  Constitutional:  Frail, chronically ill appearing Neck: Neck supple. No JVD present. No thyromegaly present.   Cardiovascular: Normal rate, regular rhythm and intact distal pulses.    Respiratory: Effort normal and breath sounds normal. No respiratory distress. On o2 by [place]  GI: Soft.  Bowel sounds are normal. She exhibits no distension. There is no tenderness.  Musculoskeletal: She exhibits no edema.  Able to move extremities. Chronic skin changes s/o PVDs. Skin tears. Self propels in wheelcahir Neurological: She is alert and conversational and can make her needs known Skin: Skin is warm and dry.   Labs reviewed  03/16/13 wbc 5.7, hb 11.3, hct 37.5, plt 301, mcv 95.9, na 138, k 4, bun 28, cr 1.2, glu 105, ca 9.3, a1c 6.3, lft wnl 05/13/13 wbc 5.5, hb 9.7, hct 28.9, plt 240, na 138, k 4.7, bun 26, cr 1.1, glu 197, ca 9.1 06/21/13 b12 722, wbc 5.4, hb 10.8, plt 261  Assessment/Plan  Senile dementia with delusion Stable on seroquel 50 mg qhs. Followed by NCEP team  Major depression Continue xanax with trazodone and remeron. Monitor closely to help with gradual dose reduction when possible  psychosis Stable at present. Followed by psych services. seroquel 50 mg daily to be continued  ATRIAL FIBRILLATION Her heart rate is controlled with diltiazem cd 120 mg daily. Not on any anticoagulation given her fall risk  b12 def Continue b12 supplement  Recurrent cystitis Stable. Continue trimethoprim prophylactic dose  GERD Continue prilosec 40 mg daily for now.   Peripheral neuropathic pain Continue her gabapentin  HYPERTENSION Blood pressure is controlled. Continue clonidine 0.1 mg daily; diltiazem cd 120 mg daily and lasix 40 mg daily. Monitor bmp   Oneal GroutMAHIMA Jennings Corado, MD  [facility] [phone] (Monday-Friday 8 am - 5 pm) [phone] (afterhours)

## 2013-10-28 ENCOUNTER — Non-Acute Institutional Stay (SKILLED_NURSING_FACILITY): Payer: Medicare Other | Admitting: Internal Medicine

## 2013-10-28 DIAGNOSIS — I482 Chronic atrial fibrillation, unspecified: Secondary | ICD-10-CM

## 2013-10-28 DIAGNOSIS — I4891 Unspecified atrial fibrillation: Secondary | ICD-10-CM

## 2013-10-28 DIAGNOSIS — N183 Chronic kidney disease, stage 3 unspecified: Secondary | ICD-10-CM

## 2013-10-28 DIAGNOSIS — I5030 Unspecified diastolic (congestive) heart failure: Secondary | ICD-10-CM

## 2013-10-28 DIAGNOSIS — I509 Heart failure, unspecified: Principal | ICD-10-CM

## 2013-10-28 DIAGNOSIS — N039 Chronic nephritic syndrome with unspecified morphologic changes: Principal | ICD-10-CM

## 2013-10-28 DIAGNOSIS — I130 Hypertensive heart and chronic kidney disease with heart failure and stage 1 through stage 4 chronic kidney disease, or unspecified chronic kidney disease: Secondary | ICD-10-CM

## 2013-11-08 ENCOUNTER — Other Ambulatory Visit: Payer: Self-pay

## 2013-11-08 LAB — CBC WITH DIFFERENTIAL/PLATELET
BASOS ABS: 0 10*3/uL (ref 0.0–0.1)
Basophil %: 0.6 %
EOS ABS: 0.1 10*3/uL (ref 0.0–0.7)
Eosinophil %: 1.1 %
HCT: 35.3 % (ref 35.0–47.0)
HGB: 11.6 g/dL — ABNORMAL LOW (ref 12.0–16.0)
Lymphocyte #: 0.9 10*3/uL — ABNORMAL LOW (ref 1.0–3.6)
Lymphocyte %: 10.5 %
MCH: 31.4 pg (ref 26.0–34.0)
MCHC: 33 g/dL (ref 32.0–36.0)
MCV: 95 fL (ref 80–100)
MONOS PCT: 8.9 %
Monocyte #: 0.7 x10 3/mm (ref 0.2–0.9)
Neutrophil #: 6.4 10*3/uL (ref 1.4–6.5)
Neutrophil %: 78.9 %
Platelet: 232 10*3/uL (ref 150–440)
RBC: 3.7 10*6/uL — AB (ref 3.80–5.20)
RDW: 14.4 % (ref 11.5–14.5)
WBC: 8.1 10*3/uL (ref 3.6–11.0)

## 2013-11-08 LAB — BASIC METABOLIC PANEL
ANION GAP: 7 (ref 7–16)
BUN: 41 mg/dL — ABNORMAL HIGH (ref 7–18)
Calcium, Total: 8.6 mg/dL (ref 8.5–10.1)
Chloride: 105 mmol/L (ref 98–107)
Co2: 30 mmol/L (ref 21–32)
Creatinine: 1.77 mg/dL — ABNORMAL HIGH (ref 0.60–1.30)
EGFR (African American): 28 — ABNORMAL LOW
GFR CALC NON AF AMER: 24 — AB
GLUCOSE: 183 mg/dL — AB (ref 65–99)
OSMOLALITY: 298 (ref 275–301)
Potassium: 3.8 mmol/L (ref 3.5–5.1)
SODIUM: 142 mmol/L (ref 136–145)

## 2013-11-08 LAB — PRO B NATRIURETIC PEPTIDE: B-Type Natriuretic Peptide: 804 pg/mL — ABNORMAL HIGH (ref 0–450)

## 2013-11-25 ENCOUNTER — Non-Acute Institutional Stay (SKILLED_NURSING_FACILITY): Payer: Medicare Other | Admitting: Internal Medicine

## 2013-11-25 ENCOUNTER — Encounter: Payer: Self-pay | Admitting: *Deleted

## 2013-11-25 DIAGNOSIS — J449 Chronic obstructive pulmonary disease, unspecified: Secondary | ICD-10-CM

## 2013-11-25 DIAGNOSIS — J9610 Chronic respiratory failure, unspecified whether with hypoxia or hypercapnia: Secondary | ICD-10-CM

## 2013-11-25 DIAGNOSIS — K219 Gastro-esophageal reflux disease without esophagitis: Secondary | ICD-10-CM

## 2013-11-25 DIAGNOSIS — J4489 Other specified chronic obstructive pulmonary disease: Secondary | ICD-10-CM

## 2013-12-13 ENCOUNTER — Non-Acute Institutional Stay (SKILLED_NURSING_FACILITY): Payer: Medicare Other | Admitting: Internal Medicine

## 2013-12-13 DIAGNOSIS — N039 Chronic nephritic syndrome with unspecified morphologic changes: Secondary | ICD-10-CM

## 2013-12-13 DIAGNOSIS — I509 Heart failure, unspecified: Secondary | ICD-10-CM

## 2013-12-13 DIAGNOSIS — N183 Chronic kidney disease, stage 3 unspecified: Secondary | ICD-10-CM

## 2013-12-13 DIAGNOSIS — I5032 Chronic diastolic (congestive) heart failure: Secondary | ICD-10-CM

## 2013-12-13 DIAGNOSIS — I130 Hypertensive heart and chronic kidney disease with heart failure and stage 1 through stage 4 chronic kidney disease, or unspecified chronic kidney disease: Secondary | ICD-10-CM

## 2013-12-27 ENCOUNTER — Other Ambulatory Visit: Payer: Self-pay | Admitting: *Deleted

## 2013-12-27 MED ORDER — ALPRAZOLAM 0.25 MG PO TABS: ORAL_TABLET | ORAL | Status: DC

## 2013-12-27 NOTE — Telephone Encounter (Signed)
[facility] 

## 2014-01-10 ENCOUNTER — Non-Acute Institutional Stay (SKILLED_NURSING_FACILITY): Payer: Medicare Other | Admitting: Internal Medicine

## 2014-01-10 DIAGNOSIS — F0391 Unspecified dementia with behavioral disturbance: Secondary | ICD-10-CM

## 2014-01-10 DIAGNOSIS — I798 Other disorders of arteries, arterioles and capillaries in diseases classified elsewhere: Secondary | ICD-10-CM

## 2014-01-10 DIAGNOSIS — F03918 Unspecified dementia, unspecified severity, with other behavioral disturbance: Secondary | ICD-10-CM

## 2014-01-10 DIAGNOSIS — E1159 Type 2 diabetes mellitus with other circulatory complications: Secondary | ICD-10-CM

## 2014-01-16 DIAGNOSIS — I509 Heart failure, unspecified: Secondary | ICD-10-CM | POA: Insufficient documentation

## 2014-01-16 NOTE — Progress Notes (Signed)
Patient ID: [name] [name] [name], female   DOB: 06/25/1917, 80 y.o.   MRN: [id number]    [name] [name] [name]  Code Status: DNR    CC- medical management of chronic issues  HPI:   80 y/o female patient is a long term [name] [name] resident here. She has been at her baseline with no new concern from pt or staff. She is on oxygen, is able to self propel on wheelchair. Off skilled speech therapy. Has confusion at baseline and this makes history taking and ROS difficult.   Review of Systems:  Denies fever or chills Denies nausea or vomiting, appetite is good Denies chest pain or dyspnea No diarrhea or constipation No falls reported  Past Medical History  Diagnosis Date  . Routine general medical examination at a health [name] [name] facility   . Other B-complex deficiencies   . Unspecified hypothyroidism   . Diverticulitis of colon (without mention of hemorrhage)   . Congestive heart failure, unspecified   . Stricture and stenosis of esophagus   . Duodenal ulcer, unspecified as acute or chronic, without hemorrhage, perforation, or obstruction   . Atrial fibrillation   . Edema   . Dysphagia, unspecified(787.20)   . Depressive type psychosis   . Sciatica   . Other constipation   . Unspecified essential hypertension   . Arthritis   . Gallstones   . PONV (postoperative nausea and vomiting)    Medication reviewed. See [facility]  Constitutional:  Frail, chronically ill appearing Neck: Neck supple. No JVD present. No thyromegaly present.   Cardiovascular: Normal rate, regular rhythm and intact distal pulses.    Respiratory: Effort normal and breath sounds normal. No respiratory distress. On o2 by [place]  GI: Soft. Bowel sounds are normal. She exhibits no distension. There is no tenderness.  Musculoskeletal: She exhibits no edema.  Able to move extremities. Chronic skin changes s/o PVDs. Skin tears. Self propels in wheelcahir Neurological: She is alert and conversational and can make her needs known Skin:  Skin is warm and dry.   Labs reviewed  03/16/13 wbc 5.7, hb 11.3, hct 37.5, plt 301, mcv 95.9, na 138, k 4, bun 28, cr 1.2, glu 105, ca 9.3, a1c 6.3, lft wnl 05/13/13 wbc 5.5, hb 9.7, hct 28.9, plt 240, na 138, k 4.7, bun 26, cr 1.1, glu 197, ca 9.1 06/21/13 b12 722, wbc 5.4, hb 10.8, plt 261 08/30/13 iron 63,tibc 293, ferritin 33, wbc 4.8, hb 11.9, hct 38.2, plt 249, na 141, k 4.2, bun 19, cr 1.3, ca 9, t.chol 174, tg 138, ldl 98, hdl 48, a1c 6.8  Assessment/Plan  Hypertension With CKD. Continue her lasix and diltiazem with clonidine for now.   afib Her heart rate is controlled with diltiazem cd 120 mg daily. Not on any anticoagulation given her fall risk  ckd Monitor renal function, continue bp meds, avoid nsaids, continue kcl supplement  chf On lasix for now, volume status stable, monitor bmp. Monitor clinically  Oneal GroutMAHIMA Adriella Essex, MD  [facility] [phone] (Monday-Friday 8 am - 5 pm) [phone] (afterhours)

## 2014-02-17 ENCOUNTER — Non-Acute Institutional Stay (SKILLED_NURSING_FACILITY): Payer: Medicare Other | Admitting: Internal Medicine

## 2014-02-17 DIAGNOSIS — I130 Hypertensive heart and chronic kidney disease with heart failure and stage 1 through stage 4 chronic kidney disease, or unspecified chronic kidney disease: Secondary | ICD-10-CM

## 2014-02-17 DIAGNOSIS — N183 Chronic kidney disease, stage 3 unspecified: Secondary | ICD-10-CM

## 2014-02-17 DIAGNOSIS — R609 Edema, unspecified: Secondary | ICD-10-CM

## 2014-02-17 DIAGNOSIS — I509 Heart failure, unspecified: Secondary | ICD-10-CM

## 2014-02-17 DIAGNOSIS — N039 Chronic nephritic syndrome with unspecified morphologic changes: Secondary | ICD-10-CM

## 2014-02-17 DIAGNOSIS — I5032 Chronic diastolic (congestive) heart failure: Secondary | ICD-10-CM

## 2014-02-17 DIAGNOSIS — R600 Localized edema: Secondary | ICD-10-CM

## 2014-02-17 DIAGNOSIS — L03116 Cellulitis of left lower limb: Secondary | ICD-10-CM

## 2014-02-17 DIAGNOSIS — L02419 Cutaneous abscess of limb, unspecified: Secondary | ICD-10-CM

## 2014-02-17 DIAGNOSIS — E039 Hypothyroidism, unspecified: Secondary | ICD-10-CM

## 2014-02-17 DIAGNOSIS — L03119 Cellulitis of unspecified part of limb: Secondary | ICD-10-CM

## 2014-02-17 DIAGNOSIS — K219 Gastro-esophageal reflux disease without esophagitis: Secondary | ICD-10-CM

## 2014-02-17 NOTE — Progress Notes (Signed)
Patient ID: [name] [name], female   DOB: 18-Oct-1916, 80 y.o.   MRN: [id number]    Facility: [facility]  Chief Complaint  Patient presents with  . Medical Management of Chronic Issues    routine visit   Allergies  Allergen Reactions  . Prednisone Other (See Comments)    Delirium   . Sertraline Hcl Other (See Comments)    Delirium    Code status- DNR  HPI 80 y/o female pt is seen for routine visit today. She complaints of pain in both her legs with swelling. As per staff she had venous doppler yesterday ruling out DVT. No fever or chills reported Pain is constant  she has history of CHF, HTN, CKD  ROS Denies chest pain Is on chronic oxygen and mentions her breathing is comfortable Pt mentions keeping her legs elevated Denies palpitations No falls or trauma reported No diarrhea reported  Past Medical History  Diagnosis Date  . Routine general medical examination at a health care facility   . Other B-complex deficiencies   . Unspecified hypothyroidism   . Diverticulitis of colon (without mention of hemorrhage)   . Congestive heart failure, unspecified   . Stricture and stenosis of esophagus   . Duodenal ulcer, unspecified as acute or chronic, without hemorrhage, perforation, or obstruction   . Atrial fibrillation   . Edema   . Dysphagia, unspecified(787.20)   . Depressive type psychosis   . Sciatica   . Other constipation   . Unspecified essential hypertension   . Arthritis   . Gallstones   . PONV (postoperative nausea and vomiting)    Medication reviewed. See [facility]  Physical exam BP 148/72  Pulse 64  Temp(Src) 97.7 F (36.5 C)  Resp 18  Constitutional: Frail, chronically ill appearing, pleasant female Neck: Neck supple. No JVD present. No thyromegaly present.   Cardiovascular: Normal rate, regular rhythm     Respiratory: Effort normal and breath sounds normal. No respiratory distress. On o2 by [place]  GI: Soft.  Bowel sounds are normal. She exhibits no distension. There is no tenderness.  Musculoskeletal: She exhibits edema of 2+ in both feet and legs left > right, small hematoma noted in left leg. Chronic stasis changes present. No weeping lesion or opening noted. Erythema and tenderness in left leg. Warmth present. Able to move extremities. Weak dorsalis pedis  Neurological: She is alert and conversational and can make her needs known Skin: Skin is warm and dry.   Labs reviewed  03/16/13 wbc 5.7, hb 11.3, hct 37.5, plt 301, mcv 95.9, na 138, k 4, bun 28, cr 1.2, glu 105, ca 9.3, a1c 6.3, lft wnl 05/13/13 wbc 5.5, hb 9.7, hct 28.9, plt 240, na 138, k 4.7, bun 26, cr 1.1, glu 197, ca 9.1 06/21/13 b12 722, wbc 5.4, hb 10.8, plt 261 12/01/13 wbc 4.9, hb 10.5, hct 33.6, plt 228, na 139, k 4.4, cl 101, co2 33, bun 32, cr 1.2, glu 108, ca 8.7 01/19/14 a1c 6.7 01/19/14 wbc 4.5, hb 10.3, mcv 100.6  Assessment/Plan  Left leg cellulitis Will treat her with keflex 500 mg qid for 5 days, florastor 250 mg id for 2 weeks, leg elevation at rest, monitor for skin tear. Stop bactrim (prophylactic for uti) for now while on keflex. To give prn percocet for pain  Leg edema Has history of ckd, chf and PVD all of this could be contributing to this. Will have her on daily weights for now. On  lasix 80 mg daily at present. With no improvement and poor renal function, d/c po lasix and will have her on torsemide 20 mg bid for next 4 days and recheck bmp 02/21/14  GERD Continue prilosec 40 mg daily  ckd stage 3 On lasix and kcl supplement, monitor kidney function  Hypothyroidism Continue levothyroxine 25 mcg daily, last tsh 4.129  HYPERTENSION Blood pressure is controlled. Continue clonidine 0.1 mg daily, diltiazem cd 120 mg daily and introduced torsemide today. Monitor bmp  chf Adjusted diuretics, see above, continue o2 and will have her on daily weight. If no improvement in edema or has problem with her breathing status  consider CXR   Oneal GroutMAHIMA Azavier Creson, MD  [facility] [phone] (Monday-Friday 8 am - 5 pm) [phone] (afterhours)

## 2014-02-21 DIAGNOSIS — J4489 Other specified chronic obstructive pulmonary disease: Secondary | ICD-10-CM | POA: Insufficient documentation

## 2014-02-21 DIAGNOSIS — J449 Chronic obstructive pulmonary disease, unspecified: Secondary | ICD-10-CM | POA: Insufficient documentation

## 2014-02-21 DIAGNOSIS — I798 Other disorders of arteries, arterioles and capillaries in diseases classified elsewhere: Secondary | ICD-10-CM | POA: Insufficient documentation

## 2014-02-21 DIAGNOSIS — F0391 Unspecified dementia with behavioral disturbance: Secondary | ICD-10-CM | POA: Insufficient documentation

## 2014-02-21 DIAGNOSIS — J9610 Chronic respiratory failure, unspecified whether with hypoxia or hypercapnia: Secondary | ICD-10-CM | POA: Insufficient documentation

## 2014-02-21 DIAGNOSIS — E1159 Type 2 diabetes mellitus with other circulatory complications: Secondary | ICD-10-CM | POA: Insufficient documentation

## 2014-02-21 DIAGNOSIS — F03918 Unspecified dementia, unspecified severity, with other behavioral disturbance: Secondary | ICD-10-CM | POA: Insufficient documentation

## 2014-02-21 NOTE — Progress Notes (Signed)
Patient ID: [name] [name], female   DOB: 1916-11-17, 80 y.o.   MRN: [id number]    Facility: [facility]  CC- medical management of chronic issues  HPI:   80 y/o female patient is seen for RV. She is at her baseline with no new concerns from staff. She is on oxygen, is able to self propel on wheelchair. Has confusion at baseline and this makes history taking and ROS difficult.   Review of Systems:  Unable to obtain  Past Medical History  Diagnosis Date  . Routine general medical examination at a health care facility   . Other B-complex deficiencies   . Unspecified hypothyroidism   . Diverticulitis of colon (without mention of hemorrhage)   . Congestive heart failure, unspecified   . Stricture and stenosis of esophagus   . Duodenal ulcer, unspecified as acute or chronic, without hemorrhage, perforation, or obstruction   . Atrial fibrillation   . Edema   . Dysphagia, unspecified(787.20)   . Depressive type psychosis   . Sciatica   . Other constipation   . Unspecified essential hypertension   . Arthritis   . Gallstones   . PONV (postoperative nausea and vomiting)    Medication reviewed. See [facility]  Physical exam BP 130/76  Pulse 76  Temp(Src) 97 F (36.1 C)  Resp 18  SpO2 98%  Constitutional: Frail, chronically ill appearing Neck: Neck supple. No JVD present. No thyromegaly present.   Cardiovascular: Normal rate, regular rhythm and intact distal pulses.    Respiratory: Effort normal and breath sounds normal. No respiratory distress. On o2 by [place]  GI: Soft. Bowel sounds are normal. She exhibits no distension. There is no tenderness.  Musculoskeletal: She exhibits no edema. Able to move extremities.Self propels in wheelcahir Neurological: She is alert and conversational and can make her needs known Skin: Skin is warm and dry.   Labs reviewed  03/16/13 wbc 5.7, hb 11.3, hct 37.5, plt 301, mcv 95.9, na 138, k 4, bun 28, cr 1.2, glu  105, ca 9.3, a1c 6.3, lft wnl 05/13/13 wbc 5.5, hb 9.7, hct 28.9, plt 240, na 138, k 4.7, bun 26, cr 1.1, glu 197, ca 9.1 06/21/13 b12 722, wbc 5.4, hb 10.8, plt 261 08/30/13 a1d 6.8  Assessment/Plan  GERD Continue prilosec 40 mg daily for now.   Copd Continue her duonebs and symbicort with o2, monitor clinically, stable  chronic respiratory failure With copd and chf, on o2 and stable.

## 2014-02-21 NOTE — Progress Notes (Signed)
This encounter was created in error - please disregard.

## 2014-02-21 NOTE — Progress Notes (Signed)
Patient ID: [name] [name], female   DOB: January 28, 1917, 80 y.o.   MRN: [id number]    Facility: [facility]  Code- DNR  Cc- RV  Allergies reviewed  HPI 80 y/o female pt with hx of chf, copd, GERD, afib, HTN among others seen today for RV. She is in no distress. Her dementia makes her history taking and ROS difficult. No new complaints from staff, had some weight gain earlier this month.  ROS  see hpi No fever or chills  breathing stable, on o2 No falls reported No new skin concerns  Past Medical History  Diagnosis Date  . Routine general medical examination at a health care facility   . Other B-complex deficiencies   . Unspecified hypothyroidism   . Diverticulitis of colon (without mention of hemorrhage)   . Congestive heart failure, unspecified   . Stricture and stenosis of esophagus   . Duodenal ulcer, unspecified as acute or chronic, without hemorrhage, perforation, or obstruction   . Atrial fibrillation   . Edema   . Dysphagia, unspecified(787.20)   . Depressive type psychosis   . Sciatica   . Other constipation   . Unspecified essential hypertension   . Arthritis   . Gallstones   . PONV (postoperative nausea and vomiting)    Medication reviewed. See [facility]  Physical exam BP 133/73  Pulse 68  Temp(Src) 98 F (36.7 C)  Resp 18  Constitutional: Frail, chronically ill appearing Neck: Neck supple. No JVD present. No thyromegaly present.   Cardiovascular: Normal rate, regular rhythm and intact distal pulses.    Respiratory: Effort normal and breath sounds normal. No respiratory distress. On o2 by   GI: Soft. Bowel sounds are normal. She exhibits no distension. There is no tenderness.  Musculoskeletal: She exhibits no edema. Able to move extremities. Chronic skin changes s/o PVDs. Self propels in wheelcahir Neurological: She is alert and conversational and can make her needs known Skin: Skin is warm and dry.   Labs  reviewed  03/16/13 wbc 5.7, hb 11.3, hct 37.5, plt 301, mcv 95.9, na 138, k 4, bun 28, cr 1.2, glu 105, ca 9.3, a1c 6.3, lft wnl 05/13/13 wbc 5.5, hb 9.7, hct 28.9, plt 240, na 138, k 4.7, bun 26, cr 1.1, glu 197, ca 9.1 06/21/13 b12 722, wbc 5.4, hb 10.8, plt 261 12/01/13 wbc 4.9, hb 10.5, hct 33.6, plt 228, na 139, k 4.4, cl 101, co2 33, bun 32, cr 1.2, glu 108, ca 8.7  Assessment/Plan  ATRIAL FIBRILLATION Her heart rate is controlled with diltiazem cd 120 mg daily. Not on any anticoagulation given her fall risk  HYPERTENSION Blood pressure is controlled. Continue clonidine 0.1 mg daily; diltiazem cd 120 mg daily and lasix 40 mg daily. Monitor bmp  CHF Stable on lasix and diltiazem, continue kcl supplement, monitor weight, continue o2

## 2014-02-21 NOTE — Progress Notes (Signed)
Patient ID: [name] [name], female   DOB: 1916-09-10, 80 y.o.   MRN: [id number]    [facility]  Code Status: DNR    CC- medical management of chronic issues  HPI:   80 y/o female patient is a long term care resident here. Has confusion at baseline and this makes history taking and ROS difficult. Continues to self propel in wheelchair. No new concern. She is at her baseline, on continuous o2 by nasal canula. Mood stable. Has dementia, chf, copd, dm  Review of Systems:  Denies fever or chills Denies nausea or vomiting, appetite is good Denies chest pain or dyspnea No diarrhea or constipation No falls reported    Past Medical History  Diagnosis Date  . Routine general medical examination at a health care facility   . Other B-complex deficiencies   . Unspecified hypothyroidism   . Diverticulitis of colon (without mention of hemorrhage)   . Congestive heart failure, unspecified   . Stricture and stenosis of esophagus   . Duodenal ulcer, unspecified as acute or chronic, without hemorrhage, perforation, or obstruction   . Atrial fibrillation   . Edema   . Dysphagia, unspecified(787.20)   . Depressive type psychosis   . Sciatica   . Other constipation   . Unspecified essential hypertension   . Arthritis   . Gallstones   . PONV (postoperative nausea and vomiting)    Medication reviewed. See [facility]  Physical exam BP 140/69  Pulse 72  Temp(Src) 98 F (36.7 C)  Resp 18  Constitutional: Frail, chronically ill appearing Cardiovascular: Normal rate, regular rhythm and intact distal pulses.    Respiratory: Effort normal and breath sounds normal. No respiratory distress. On o2 by [place]  Musculoskeletal: She exhibits no edema. Able to move extremities. Chronic skin changes s/o PVDs. Neurological: She is alert and conversational and can make her needs known Skin: Skin is warm and dry.    Labs 03/16/13 wbc 5.7, hb 11.3, hct 37.5, plt 301, mcv 95.9, na 138, k 4, bun 28, cr  1.2, glu 105, ca 9.3, a1c 6.3, lft wnl 05/13/13 wbc 5.5, hb 9.7, hct 28.9, plt 240, na 138, k 4.7, bun 26, cr 1.1, glu 197, ca 9.1 06/21/13 b12 722, wbc 5.4, hb 10.8, plt 261 12/01/13 wbc 4.9, hb 10.5, hct 33.6, plt 228, na 139, k 4.4, cl 101, co2 33, bun 32, cr 1.2, glu 108, ca 8.7  Assessment/plan  Dm with circulatory disorder a1c 6.8 in 3/15. ldl at goal. Not on ACEI due to CKD.   Peripheral angiopathy Monitor for skin changes and open sores. No wounds at present/ has dm, continue foot care  Dementia with behavioral disturbance Continue trazodone, remeron and xanax, monitor clinically, decline anticipated. Continue seroquel, calm this visit

## 2014-02-28 ENCOUNTER — Other Ambulatory Visit: Payer: Self-pay | Admitting: *Deleted

## 2014-02-28 MED ORDER — ALPRAZOLAM 0.25 MG PO TABS: ORAL_TABLET | ORAL | Status: DC

## 2014-02-28 NOTE — Telephone Encounter (Signed)
[facility] 

## 2014-03-17 ENCOUNTER — Other Ambulatory Visit: Payer: Self-pay | Admitting: *Deleted

## 2014-03-17 MED ORDER — OXYCODONE-ACETAMINOPHEN 5-325 MG PO TABS: ORAL_TABLET | ORAL | Status: DC

## 2014-03-17 NOTE — Telephone Encounter (Signed)
[facility] 

## 2014-03-28 ENCOUNTER — Non-Acute Institutional Stay (SKILLED_NURSING_FACILITY): Payer: Medicare Other | Admitting: Internal Medicine

## 2014-03-28 DIAGNOSIS — I482 Chronic atrial fibrillation, unspecified: Secondary | ICD-10-CM

## 2014-03-28 DIAGNOSIS — J449 Chronic obstructive pulmonary disease, unspecified: Secondary | ICD-10-CM

## 2014-03-28 DIAGNOSIS — J9610 Chronic respiratory failure, unspecified whether with hypoxia or hypercapnia: Secondary | ICD-10-CM

## 2014-03-28 DIAGNOSIS — L02419 Cutaneous abscess of limb, unspecified: Secondary | ICD-10-CM

## 2014-03-28 DIAGNOSIS — I4891 Unspecified atrial fibrillation: Secondary | ICD-10-CM

## 2014-03-28 DIAGNOSIS — L03119 Cellulitis of unspecified part of limb: Secondary | ICD-10-CM

## 2014-03-28 NOTE — Progress Notes (Signed)
Patient ID: [name] [name], female   DOB: 1916-08-12, 80 y.o.   MRN: [id number]     Facility: [facility]  Chief Complaint  Patient presents with  . Medical Management of Chronic Issues   Allergies  Allergen Reactions  . Prednisone Other (See Comments)    Delirium   . Sertraline Hcl Other (See Comments)    Delirium     Code status- DNR  HPI 80 y/o female pt is seen for routine visit today. She is on o2, in no distress and says she has had a good day. No complaints from pt. No concern from staff she has history of CHF, HTN, CKD and chronic resp failure  Review of Systems  Constitutional: Negative for fever, chills, diaphoresis.  HENT: Negative for congestion, hearing loss and sore throat.   Eyes: Negative for blurred vision.  Respiratory: Negative for cough, sputum production, shortness of breath and wheezing. On o2  Cardiovascular: Negative for chest pain, palpitations. Has leg swelling, both legs wrapped  Gastrointestinal: Negative for heartburn, nausea, vomiting, abdominal pain, diarrhea and constipation.  Genitourinary: Negative for dysuria Musculoskeletal: Negative for back pain, falls Skin: Negative for itching and rash.  Neurological: Negative for dizziness, headaches.  Psychiatric/Behavioral: Negative for depression   Past Medical History  Diagnosis Date  . Routine general medical examination at a health care facility   . Other B-complex deficiencies   . Unspecified hypothyroidism   . Diverticulitis of colon (without mention of hemorrhage)   . Congestive heart failure, unspecified   . Stricture and stenosis of esophagus   . Duodenal ulcer, unspecified as acute or chronic, without hemorrhage, perforation, or obstruction   . Atrial fibrillation   . Edema   . Dysphagia, unspecified(787.20)   . Depressive type psychosis   . Sciatica   . Other constipation   . Unspecified essential hypertension   . Arthritis   .  Gallstones   . PONV (postoperative nausea and vomiting)    Medication reviewed. See [facility]  Physical exam BP 103/60  Pulse 88  Temp(Src) 97.9 F (36.6 C)  Resp 18  SpO2 96%  Constitutional: Frail, chronically ill appearing Neck: Neck supple. No JVD present. No thyromegaly present.   Cardiovascular: Normal rate, regular rhythm and intact distal pulses.    Respiratory: Effort normal and breath sounds normal. No respiratory distress. On o2 by [place]  GI: Soft. Bowel sounds are normal. She exhibits no distension. There is no tenderness.  Musculoskeletal: She exhibits edema.  Able to move extremities. Chronic skin changes s/o PVDs. Self propels in wheelcahir Neurological: She is alert and conversational and can make her needs known Skin: Skin is warm and dry.   Labs reviewed 03/16/13 wbc 5.7, hb 11.3, hct 37.5, plt 301, mcv 95.9, na 138, k 4, bun 28, cr 1.2, glu 105, ca 9.3, a1c 6.3, lft wnl 05/13/13 wbc 5.5, hb 9.7, hct 28.9, plt 240, na 138, k 4.7, bun 26, cr 1.1, glu 197, ca 9.1 06/21/13 b12 722, wbc 5.4, hb 10.8, plt 261 12/01/13 wbc 4.9, hb 10.5, hct 33.6, plt 228, na 139, k 4.4, cl 101, co2 33, bun 32, cr 1.2, glu 108, ca 8.7 01/19/14 a1c 6.7 01/19/14 wbc 4.5, hb 10.3, mcv 100.6  Assessment/Plan  1. Chronic airway obstruction, not elsewhere classified Stable, continue her bronchodilators - duoneb and symbicort with her oxygen  2. Chronic respiratory failure, unspecified whether with hypoxia or hypercapnia Continue o2 by [place] and bronchodilator  3. Cellulitis  and abscess of leg, except foot Completed antibiotics, resolved. Now on demadex and tolerating this well. Legs ace wrapped and keeps elevated  4. Chronic atrial fibrillation Rate controlled with diltiazem, off anticoagulation with risk for bleed

## 2014-04-29 ENCOUNTER — Non-Acute Institutional Stay (SKILLED_NURSING_FACILITY): Payer: Medicare Other | Admitting: Internal Medicine

## 2014-04-29 DIAGNOSIS — I482 Chronic atrial fibrillation, unspecified: Secondary | ICD-10-CM

## 2014-04-29 DIAGNOSIS — F0390 Unspecified dementia without behavioral disturbance: Secondary | ICD-10-CM

## 2014-04-29 DIAGNOSIS — D638 Anemia in other chronic diseases classified elsewhere: Secondary | ICD-10-CM

## 2014-04-29 DIAGNOSIS — I5032 Chronic diastolic (congestive) heart failure: Secondary | ICD-10-CM

## 2014-04-29 DIAGNOSIS — J9610 Chronic respiratory failure, unspecified whether with hypoxia or hypercapnia: Secondary | ICD-10-CM

## 2014-04-29 DIAGNOSIS — F0392 Unspecified dementia, unspecified severity, with psychotic disturbance: Secondary | ICD-10-CM

## 2014-04-29 DIAGNOSIS — N184 Chronic kidney disease, stage 4 (severe): Secondary | ICD-10-CM

## 2014-04-29 DIAGNOSIS — I10 Essential (primary) hypertension: Secondary | ICD-10-CM

## 2014-04-29 DIAGNOSIS — E1122 Type 2 diabetes mellitus with diabetic chronic kidney disease: Secondary | ICD-10-CM

## 2014-04-29 DIAGNOSIS — F22 Delusional disorders: Secondary | ICD-10-CM

## 2014-04-29 DIAGNOSIS — N189 Chronic kidney disease, unspecified: Secondary | ICD-10-CM

## 2014-04-30 NOTE — Progress Notes (Signed)
Patient ID: [name] [name], female   DOB: 12/02/1916, 80 y.o.   MRN: [id number]    Facility: [facility]   Allergies reviewed  CC- ANNUAL EXAM  Code status- DNR  HPI 80 y/o female pt is seen for annual exam. she has history of CHF, DM, HTN, CKD, anemia of chronic diease, chronic resp failure and dementia among others. She has been at her baseline. Her leg edema has improved with the wraps. contnues to be on o2. Denies any complaints today. She tells me that she loves her coffee. No new concern from staff. No falls reported. No acute behavioral changes  Review of Systems  Constitutional: Negative for fever, chills, diaphoresis. Weight has been stable HENT: Negative for congestion, hearing loss and sore throat.   Eyes: Negative for blurred vision.  Respiratory: Negative for shortness of breath and wheezing. On o2 . Has some cough Cardiovascular: Negative for chest pain, palpitations. Has both legs wrapped  Gastrointestinal: Negative for heartburn, nausea, vomiting, abdominal pain, diarrhea and constipation.  Genitourinary: Negative for dysuria Musculoskeletal: Negative for back pain, falls Skin: Negative for itching and rash.  Neurological: Negative for dizziness, headaches.  Psychiatric/Behavioral: Negative for depression   Past Medical History  Diagnosis Date  . Routine general medical examination at a health care facility   . Other B-complex deficiencies   . Unspecified hypothyroidism   . Diverticulitis of colon (without mention of hemorrhage)   . Congestive heart failure, unspecified   . Stricture and stenosis of esophagus   . Duodenal ulcer, unspecified as acute or chronic, without hemorrhage, perforation, or obstruction   . Atrial fibrillation   . Edema   . Dysphagia, unspecified(787.20)   . Depressive type psychosis   . Sciatica   . Other constipation   . Unspecified essential hypertension   . Arthritis   . Gallstones   . PONV  (postoperative nausea and vomiting)    Past Surgical History  Procedure Laterality Date  . Abdominal hysterectomy    . Cholecystectomy     Medication reviewed. See MAR Family History  Problem Relation Age of Onset  . Cancer Other     breast   . Heart disease Other    Physical exam BP 118/60  Pulse 76  Temp(Src) 98.2 F (36.8 C)  Resp 18  SpO2 99%  Constitutional: Frail, chronically ill appearing, pleasant HEENT: NCAT, perrla, mmm, upper and lower dentures Neck: Neck supple. No JVD present. No thyromegaly present.   Cardiovascular: Normal rate, regular rhythm and diminished distal pulses.    Respiratory: Effort normal and breath sounds normal. No respiratory distress. On o2 by [place]  GI: Soft. Bowel sounds are normal. She exhibits no distension. There is no tenderness.  Musculoskeletal: She exhibits edema. Able to move extremities. Chronic skin changes s/o PVDs. Self propels in wheelcahir Neurological: She is alert and conversational and can make her needs known Skin: Skin is warm and dry.   Labs reviewed 03/16/13 wbc 5.7, hb 11.3, hct 37.5, plt 301, mcv 95.9, na 138, k 4, bun 28, cr 1.2, glu 105, ca 9.3, a1c 6.3, lft wnl 05/13/13 wbc 5.5, hb 9.7, hct 28.9, plt 240, na 138, k 4.7, bun 26, cr 1.1, glu 197, ca 9.1 06/21/13 b12 722, wbc 5.4, hb 10.8, plt 261 12/01/13 wbc 4.9, hb 10.5, hct 33.6, plt 228, na 139, k 4.4, cl 101, co2 33, bun 32, cr 1.2, glu 108, ca 8.7 01/19/14 a1c 6.7 01/19/14 wbc 4.5, hb 10.3, mcv 100.6 03/02/14  wbc 5.7, hb 10.2, hct 34, mcv 101.5, plt 241, na 142, k 4.3, cl 102, co2 33, bun 43, cr 1.5, glu 136, ca 9.4, vi d 35 03/21/14 wbc 4.4, hb 10.3, hct 34.3, mcv 100.6, plt 192, na 142, k 4, bun 32, cr 1.7, glu 164, ca 8.5, tsh 5.39, a1c 6.6  Assessment/Plan  CHF Stable, continue torsemide and kcl, monitor bmp  Chronic respiratory failure Stable on chronic o2 with her bronchodilators  ckd stage 4 Stable, on demadex, monitor renal function  Anemia of chronic  disease Continue her iron supplement. Stable h&h  Dm type 2 Diet controlled. ldl at goal. Not on ACEI with ckd. Monitor a1c. uptodate with her eye and foot exam  Chronic atrial fibrillation Rate controlled with diltiazem, off anticoagulation with risk for bleed  HTN bp controlled, continue current regimen, no changes made  PVD Continue skin care, wraps in her legs, keep legs elevated at rest. On demadex. Monitor her skin for breakdown  Senile dementia Decline anticipated, monitor clinically. Continue skin care, weight monitoring, assistance with ADLs

## 2014-05-20 ENCOUNTER — Encounter: Payer: Self-pay | Admitting: Internal Medicine

## 2014-05-26 NOTE — Progress Notes (Signed)
Patient ID: [name] [name], female   DOB: 03/29/1917, 80 y.o.   MRN: [id number]  This encounter was created in error - please disregard.

## 2014-05-29 IMAGING — CR DG CHEST 2V
2 series · 2 of 2 positions shown · non-contrast
Comparison: 06/09/2012.

CLINICAL DATA: Shortness of breath..  Cough.  Anxiety.

CHEST - 2 VIEW

[w chest lat]
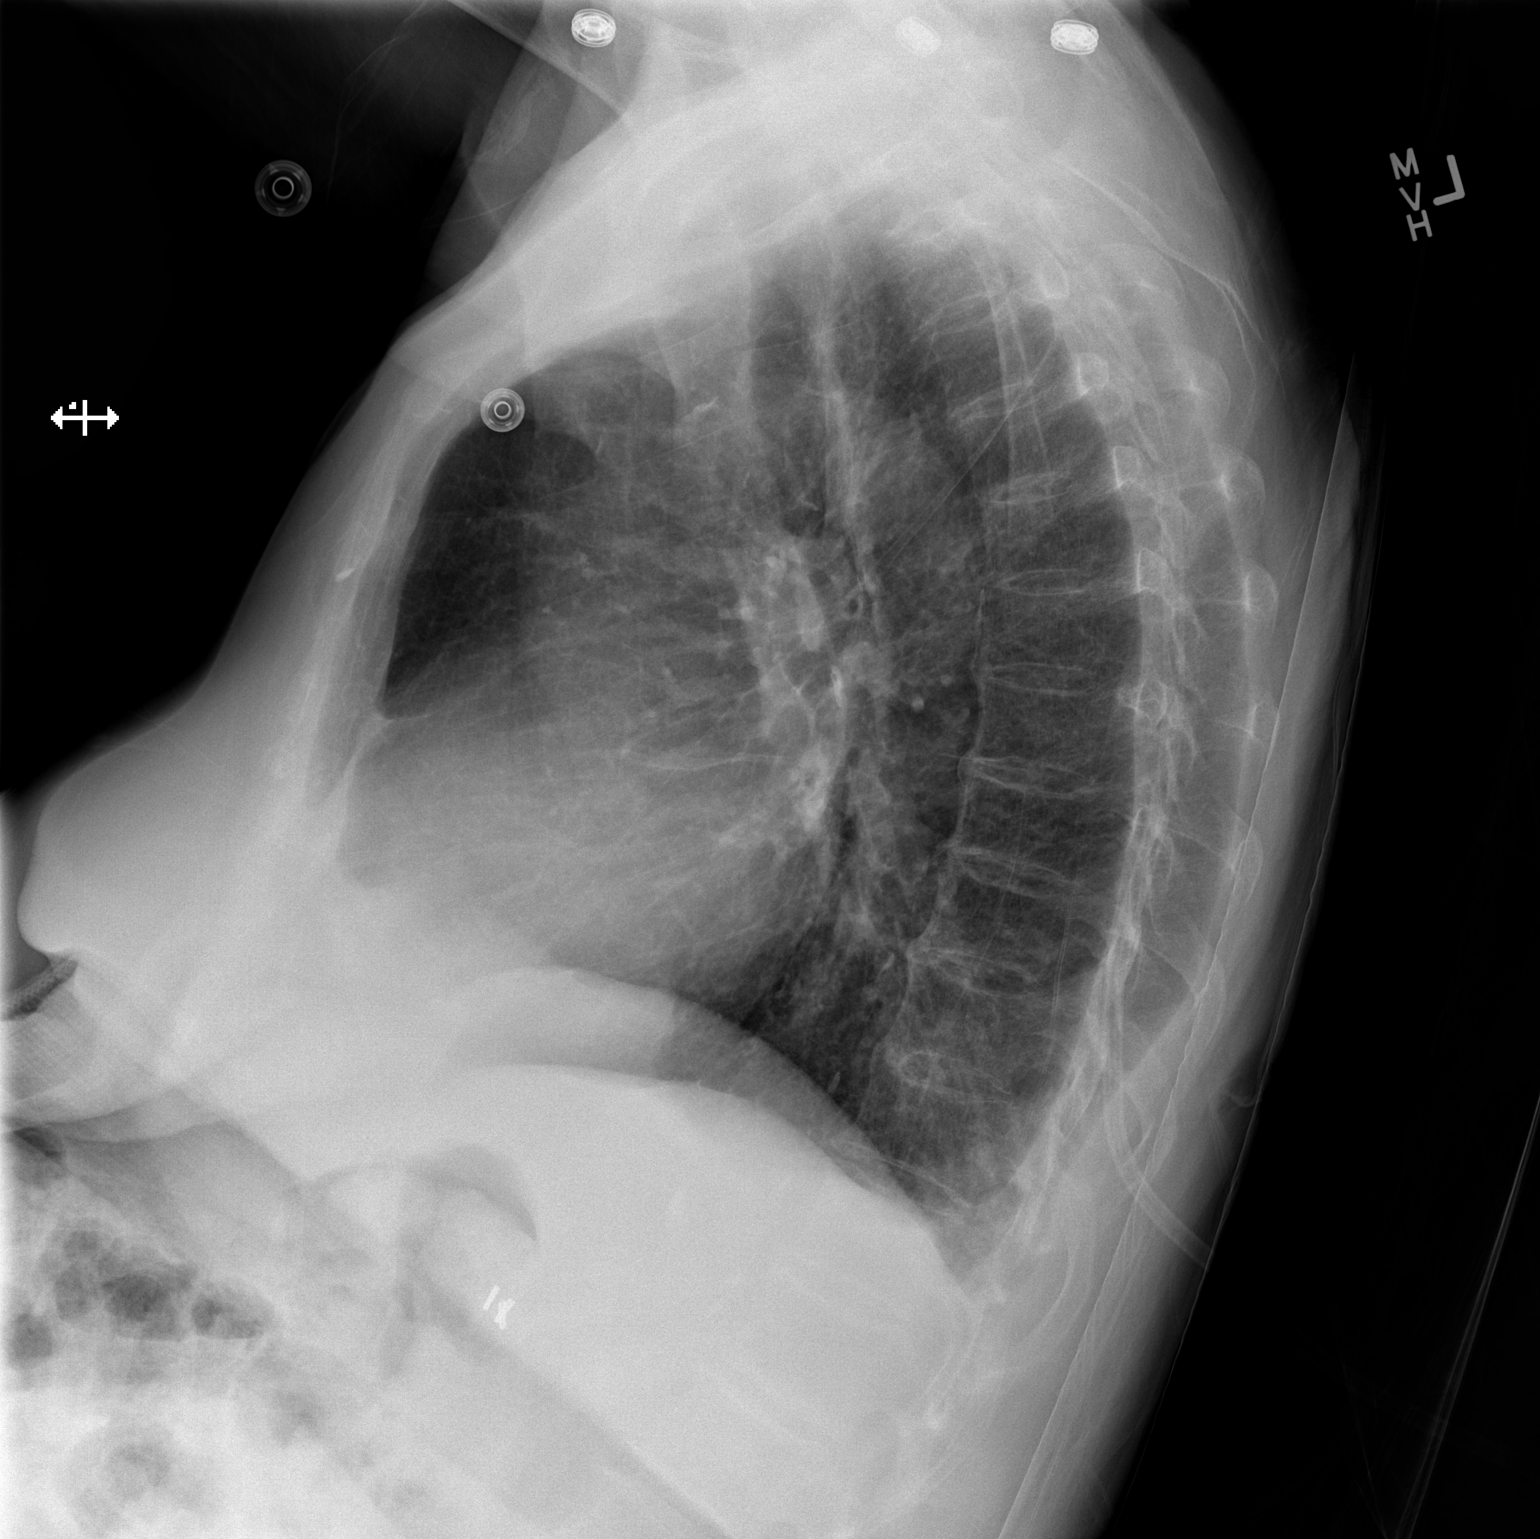

[x chest ap]
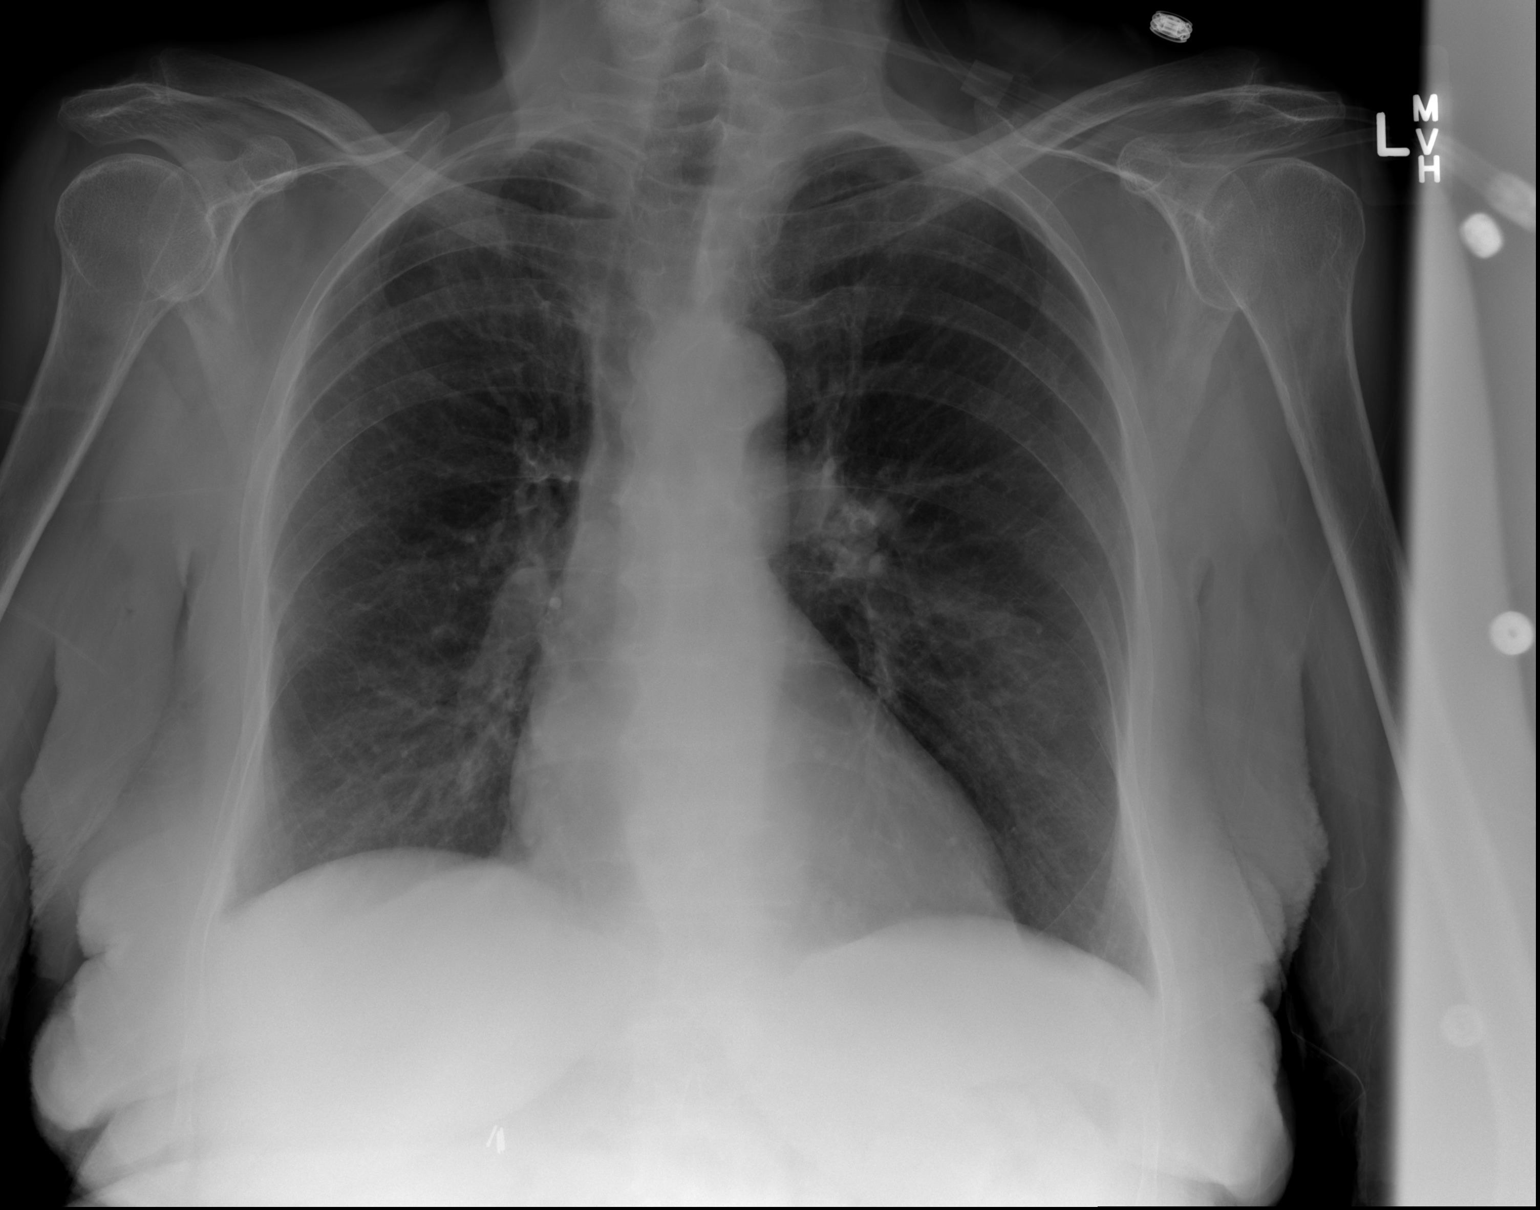

[2 of 2 positions shown; findings below may reference images not displayed]

FINDINGS: No infiltrate, congestive heart failure or pneumothorax..
Mild central pulmonary vascular prominence with minimal
peribronchial thickening unchanged.  Heart size within normal
limits. Slightly tortuous aorta.
IMPRESSION: No acute abnormality.  Please see above.

## 2014-05-30 ENCOUNTER — Non-Acute Institutional Stay (SKILLED_NURSING_FACILITY): Payer: Medicare Other | Admitting: Internal Medicine

## 2014-05-30 DIAGNOSIS — D519 Vitamin B12 deficiency anemia, unspecified: Secondary | ICD-10-CM

## 2014-05-30 DIAGNOSIS — N184 Chronic kidney disease, stage 4 (severe): Secondary | ICD-10-CM | POA: Insufficient documentation

## 2014-05-30 DIAGNOSIS — F05 Delirium due to known physiological condition: Principal | ICD-10-CM

## 2014-05-30 DIAGNOSIS — D638 Anemia in other chronic diseases classified elsewhere: Secondary | ICD-10-CM | POA: Insufficient documentation

## 2014-05-30 DIAGNOSIS — F0390 Unspecified dementia without behavioral disturbance: Secondary | ICD-10-CM

## 2014-05-30 DIAGNOSIS — F0392 Unspecified dementia, unspecified severity, with psychotic disturbance: Secondary | ICD-10-CM

## 2014-05-30 DIAGNOSIS — E1122 Type 2 diabetes mellitus with diabetic chronic kidney disease: Secondary | ICD-10-CM | POA: Insufficient documentation

## 2014-05-30 NOTE — Progress Notes (Signed)
Patient ID: [name] [name], female   DOB: 10/11/1916, 80 y.o.   MRN: [id number]    Facility[facility]   Allergies reviewed  Chief complaint- routine visit  Code status- DNR  HPI 80 y/o female pt is seen for RV. She is seen in her room. She is on o2, at her baseline. No new concern from staff.  she has history of CHF, DM, HTN, CKD, anemia of chronic diease, chronic resp failure and dementia among others.   Review of Systems   Constitutional: Negative for fever, chills, diaphoresis. Weight has been stable HENT: Negative for congestion, hearing loss and sore throat.      Respiratory: Negative for shortness of breath and wheezing. On o2 .  Cardiovascular: Negative for chest pain, palpitations. Has both legs wrapped   Gastrointestinal: Negative for heartburn, nausea, vomiting, abdominal pain, diarrhea and constipation.   Genitourinary: Negative for dysuria Musculoskeletal: Negative for back pain, falls  Past Medical History  Diagnosis Date  . Routine general medical examination at a health care facility   . Other B-complex deficiencies   . Unspecified hypothyroidism   . Diverticulitis of colon (without mention of hemorrhage)   . Congestive heart failure, unspecified   . Stricture and stenosis of esophagus   . Duodenal ulcer, unspecified as acute or chronic, without hemorrhage, perforation, or obstruction   . Atrial fibrillation   . Edema   . Dysphagia, unspecified(787.20)   . Depressive type psychosis   . Sciatica   . Other constipation   . Unspecified essential hypertension   . Arthritis   . Gallstones   . PONV (postoperative nausea and vomiting)    [facility]  Physical exam BP 123/67 mmHg  Pulse 72  Temp(Src) 97.8 F (36.6 C)  Resp 18  Constitutional: Frail, chronically ill appearing, pleasant HEENT: NCAT, perrla, mmm, upper and lower dentures Neck: Neck supple. No JVD present. No thyromegaly present.     Cardiovascular: Normal rate, regular rhythm and diminished distal pulses.    Respiratory: Effort normal and breath sounds normal. No respiratory distress. On o2 by [place]  GI: Soft. Bowel sounds are normal. She exhibits no distension. There is no tenderness.  Musculoskeletal: She exhibits edema. Able to move extremities. Chronic skin changes s/o PVDs. Self propels in wheelchair. Both legs wrapped Neurological: She is alert and conversational and can make her needs known Skin: Skin is warm and dry.   Labs reviewed 03/16/13 wbc 5.7, hb 11.3, hct 37.5, plt 301, mcv 95.9, na 138, k 4, bun 28, cr 1.2, glu 105, ca 9.3, a1c 6.3, lft wnl 05/13/13 wbc 5.5, hb 9.7, hct 28.9, plt 240, na 138, k 4.7, bun 26, cr 1.1, glu 197, ca 9.1 06/21/13 b12 722, wbc 5.4, hb 10.8, plt 261 12/01/13 wbc 4.9, hb 10.5, hct 33.6, plt 228, na 139, k 4.4, cl 101, co2 33, bun 32, cr 1.2, glu 108, ca 8.7 01/19/14 a1c 6.7 01/19/14 wbc 4.5, hb 10.3, mcv 100.6 03/02/14 wbc 5.7, hb 10.2, hct 34, mcv 101.5, plt 241, na 142, k 4.3, cl 102, co2 33, bun 43, cr 1.5, glu 136, ca 9.4, vi d 35 03/21/14 wbc 4.4, hb 10.3, hct 34.3, mcv 100.6, plt 192, na 142, k 4, bun 32, cr 1.7, glu 164, ca 8.5, tsh 5.39, a1c 6.6  Assessment/Plan  Senile dementia Chronic, decline anticipated. Comfort care is goal of care. Off all medication for now. Monitor weight. Continue skin care.  b12 def anemia Continue b12  supplement and monitor clinically  Delirium with dementia On seroquel 50 mg daily with prn xanax and prn trazodone for her anxiety and sleep

## 2014-06-27 ENCOUNTER — Non-Acute Institutional Stay (SKILLED_NURSING_FACILITY): Payer: Medicare Other | Admitting: Internal Medicine

## 2014-06-27 ENCOUNTER — Other Ambulatory Visit: Payer: Self-pay | Admitting: *Deleted

## 2014-06-27 DIAGNOSIS — K219 Gastro-esophageal reflux disease without esophagitis: Secondary | ICD-10-CM

## 2014-06-27 DIAGNOSIS — J9610 Chronic respiratory failure, unspecified whether with hypoxia or hypercapnia: Secondary | ICD-10-CM

## 2014-06-27 DIAGNOSIS — N184 Chronic kidney disease, stage 4 (severe): Secondary | ICD-10-CM

## 2014-06-27 DIAGNOSIS — M150 Primary generalized (osteo)arthritis: Secondary | ICD-10-CM

## 2014-06-27 MED ORDER — OXYCODONE-ACETAMINOPHEN 5-325 MG PO TABS: ORAL_TABLET | ORAL | Status: DC

## 2014-07-29 NOTE — Progress Notes (Signed)
Patient ID: [name] [name], female   DOB: 10/17/1916, [age] y.o.   MRN: [id number]    [facility]ility[facility]  Code status- DNR  Cc- routine visit  HPI 81 y/o female pt is seen for routine visit today. She has been at her baseline with no nursing concerns.  Review of Systems  Constitutional: Negative for fever, chills, diaphoresis.  HENT: Negative for congestion.   Eyes: Negative for blurred vision, double vision and discharge.  Respiratory: Negative for cough, sputum production, shortness of breath and wheezing.   Cardiovascular: Negative for chest pain, palpitations Gastrointestinal: Negative for heartburn, nausea, vomiting, abdominal pain Genitourinary: Negative for dysuria.  Musculoskeletal: Negative for back pain, falls Neurological: Negative for dizziness, tingling, focal weakness and headaches.  Psychiatric/Behavioral: Negative for depression   Past Medical History  Diagnosis Date  . Routine general medical examination at a health care facility   . Other B-complex deficiencies   . Unspecified hypothyroidism   . Diverticulitis of colon (without mention of hemorrhage)   . Congestive heart failure, unspecified   . Stricture and stenosis of esophagus   . Duodenal ulcer, unspecified as acute or chronic, without hemorrhage, perforation, or obstruction   . Atrial fibrillation   . Edema   . Dysphagia, unspecified(787.20)   . Depressive type psychosis   . Sciatica   . Other constipation   . Unspecified essential hypertension   . Arthritis   . Gallstones   . PONV (postoperative nausea and vomiting)    Medication [facility]. [facility]  Physical exam BP 127/76 mmHg  Pulse 80  Temp(Src) 97.6 F (36.4 C)  Resp 18  SpO2 99%   Constitutional: Frail, chronically ill appearing, pleasant HEENT: NCAT, perrla, mmm, upper and lower dentures Neck: Neck supple. No JVD present. No thyromegaly present.   Cardiovascular: Normal rate, regular  rhythm and diminished distal pulses.    Respiratory: Effort normal and breath sounds normal. No respiratory distress. On o2 by [place]  GI: Soft. Bowel sounds are normal. She exhibits no distension. There is no tenderness.  Musculoskeletal: She exhibits edema. Able to move extremities. Chronic skin changes s/o PVDs. Self propels in wheelcahir Neurological: She is alert and conversational and can make her needs known Skin: Skin is warm and dry.   Labs reviewed 03/16/13 wbc 5.7, hb 11.3, hct 37.5, plt 301, mcv 95.9, na 138, k 4, bun 28, cr 1.2, glu 105, ca 9.3, a1c 6.3, lft wnl 05/13/13 wbc 5.5, hb 9.7, hct 28.9, plt 240, na 138, k 4.7, bun 26, cr 1.1, glu 197, ca 9.1 06/21/13 b12 722, wbc 5.4, hb 10.8, plt 261 12/01/13 wbc 4.9, hb 10.5, hct 33.6, plt 228, na 139, k 4.4, cl 101, co2 33, bun 32, cr 1.2, glu 108, ca 8.7 01/19/14 a1c 6.7 01/19/14 wbc 4.5, hb 10.3, mcv 100.6 03/02/14 wbc 5.7, hb 10.2, hct 34, mcv 101.5, plt 241, na 142, k 4.3, cl 102, co2 33, bun 43, cr 1.5, glu 136, ca 9.4, vi d 35 03/21/14 wbc 4.4, hb 10.3, hct 34.3, mcv 100.6, plt 192, na 142, k 4, bun 32, cr 1.7, glu 164, ca 8.5, tsh 5.39, a1c 6.6 06/08/14 wbc 5, hb 10.4, hct 33.2, plt 181, na 138, k 4.5, bun 25, cr 1.4, glu 119, a1c 7.2  Assessment/Plan  gerd Continue prilosec 40 mg daily  OA Stable, continue percocet prn with colace  Chronic respiratory failure With her chf and copd. Stable on chronic o2 with her bronchodilators  ckd stage 4 Stable, on demadex, monitor renal function

## 2014-08-24 ENCOUNTER — Other Ambulatory Visit: Payer: Self-pay | Admitting: *Deleted

## 2014-08-24 MED ORDER — ALPRAZOLAM 0.25 MG PO TABS: ORAL_TABLET | ORAL | Status: DC

## 2014-08-30 LAB — TSH: TSH: 3.39 u[IU]/mL (ref <=5.90)

## 2014-08-30 LAB — LIPID PANEL
Cholesterol: 171 mg/dL (ref 0–200)
HDL: 54 mg/dL (ref 35–70)
LDL CALC: 92 mg/dL
Triglycerides: 127 mg/dL (ref 40–160)

## 2014-09-05 LAB — HM DIABETES EYE EXAM

## 2014-09-08 ENCOUNTER — Non-Acute Institutional Stay (SKILLED_NURSING_FACILITY): Payer: Medicare Other | Admitting: Internal Medicine

## 2014-09-08 DIAGNOSIS — F0280 Dementia in other diseases classified elsewhere without behavioral disturbance: Secondary | ICD-10-CM | POA: Diagnosis not present

## 2014-09-08 DIAGNOSIS — E538 Deficiency of other specified B group vitamins: Secondary | ICD-10-CM | POA: Diagnosis not present

## 2014-09-08 DIAGNOSIS — F03918 Unspecified dementia, unspecified severity, with other behavioral disturbance: Secondary | ICD-10-CM

## 2014-09-08 DIAGNOSIS — F329 Major depressive disorder, single episode, unspecified: Secondary | ICD-10-CM

## 2014-09-08 DIAGNOSIS — E1142 Type 2 diabetes mellitus with diabetic polyneuropathy: Secondary | ICD-10-CM | POA: Insufficient documentation

## 2014-09-08 DIAGNOSIS — N390 Urinary tract infection, site not specified: Secondary | ICD-10-CM | POA: Diagnosis not present

## 2014-09-08 DIAGNOSIS — J9610 Chronic respiratory failure, unspecified whether with hypoxia or hypercapnia: Secondary | ICD-10-CM

## 2014-09-08 DIAGNOSIS — D638 Anemia in other chronic diseases classified elsewhere: Secondary | ICD-10-CM

## 2014-09-08 DIAGNOSIS — F0393 Unspecified dementia, unspecified severity, with mood disturbance: Secondary | ICD-10-CM

## 2014-09-08 DIAGNOSIS — F0391 Unspecified dementia with behavioral disturbance: Secondary | ICD-10-CM | POA: Diagnosis not present

## 2014-09-08 NOTE — Progress Notes (Addendum)
Patient ID: [name] [name], female   DOB: Nov 02, 1916, 81 y.o.   MRN: [id number]    Facility: [facility]  Code status- DNR  Cc- routine visit  Allergies reviewed  HPI 81 y/o female pt is seen for routine visit today. She has been at her baseline with no nursing concerns. She is pleasantly confused. Se is drinking her coffee this am. No acute psychosis episodes. Weight remains stable. Participates in bingo.  Review of Systems  Constitutional: Negative for fever, chills, diaphoresis.  HENT: Negative for congestion.   Eyes: Negative for blurred vision, discharge.  Respiratory: Negative for cough, sputum production, shortness of breath and wheezing.   Cardiovascular: Negative for chest pain, palpitations Gastrointestinal: Negative for heartburn, nausea, vomiting, abdominal pain Genitourinary: Negative for dysuria.  Musculoskeletal: Negative for back pain, falls Neurological: Negative for dizziness, tingling, focal weakness and headaches.  Psychiatric/Behavioral: Negative for depression   Past Medical History  Diagnosis Date  . Routine general medical examination at a health care facility   . Other B-complex deficiencies   . Unspecified hypothyroidism   . Diverticulitis of colon (without mention of hemorrhage)   . Congestive heart failure, unspecified   . Stricture and stenosis of esophagus   . Duodenal ulcer, unspecified as acute or chronic, without hemorrhage, perforation, or obstruction   . Atrial fibrillation   . Edema   . Dysphagia, unspecified(787.20)   . Depressive type psychosis   . Sciatica   . Other constipation   . Unspecified essential hypertension   . Arthritis   . Gallstones   . PONV (postoperative nausea and vomiting)    Medication reviewed. See MAR  Physical exam  Constitutional: Frail, chronically ill appearing, pleasant HEENT: NCAT, perrla, mmm, upper and lower dentures Neck: Neck supple. No JVD present. No  thyromegaly present.   Cardiovascular: Normal rate, regular rhythm and diminished distal pulses.    Respiratory: Effort normal and breath sounds normal. No respiratory distress. On o2 by [place]  GI: Soft. Bowel sounds are normal. She exhibits no distension. There is no tenderness.  Musculoskeletal: She exhibits edema. Able to move extremities. Chronic skin changes s/o PVDs. Self propels in wheelchair. compression wraps in both LE Neurological: She is alert and conversational and can make her needs known Skin: Skin is warm and dry.   Labs reviewed 03/16/13 wbc 5.7, hb 11.3, hct 37.5, plt 301, mcv 95.9, na 138, k 4, bun 28, cr 1.2, glu 105, ca 9.3, a1c 6.3, lft wnl 05/13/13 wbc 5.5, hb 9.7, hct 28.9, plt 240, na 138, k 4.7, bun 26, cr 1.1, glu 197, ca 9.1 06/21/13 b12 722, wbc 5.4, hb 10.8, plt 261 12/01/13 wbc 4.9, hb 10.5, hct 33.6, plt 228, na 139, k 4.4, cl 101, co2 33, bun 32, cr 1.2, glu 108, ca 8.7 01/19/14 a1c 6.7 01/19/14 wbc 4.5, hb 10.3, mcv 100.6 03/02/14 wbc 5.7, hb 10.2, hct 34, mcv 101.5, plt 241, na 142, k 4.3, cl 102, co2 33, bun 43, cr 1.5, glu 136, ca 9.4, vi d 35 03/21/14 wbc 4.4, hb 10.3, hct 34.3, mcv 100.6, plt 192, na 142, k 4, bun 32, cr 1.7, glu 164, ca 8.5, tsh 5.39, a1c 6.6 06/08/14 wbc 5, hb 10.4, hct 33.2, plt 181, na 138, k 4.5, bun 25, cr 1.4, glu 119, a1c 7.2  Assessment/Plan  Anemia of chronic disease Continue nu-iron 150 mg bid, hb/hct stable 12/15  Type 2 dm with neuropathy Last a1c 7.2, off all medication,  monitor clinically, on cbg check once a week. Continue neurontin 100 mg daily and prn percocet for pain  Depression with dementia On xanax 0.25 mg tid and 0.25 mg bid prn for anxiety and remeron to help with her mood  Dementia with behavioral disturbance Calm this visit, no acute psychosis episode witnessed by staff, continue remeron 30 mg dsaily and seroquel 50 mg daily. Skin care, fall precautions, assistance with ADLs  b12 deficiency Continue b12 1000 mcg  daily  Recurrent uti Continue trimethoprim for uti prophylaxis  Chronic respiratory failure With her chf and copd. Stable on chronic o2 with her bronchodilators  Vital signs reviewed. BP 138/64, 88/min, 18/min, 98 temp, 96% on o2

## 2014-09-09 ENCOUNTER — Other Ambulatory Visit: Payer: Self-pay | Admitting: *Deleted

## 2014-09-09 MED ORDER — OXYCODONE-ACETAMINOPHEN 5-325 MG PO TABS: ORAL_TABLET | ORAL | Status: DC

## 2014-09-09 NOTE — Telephone Encounter (Signed)
[facility] 

## 2014-10-24 ENCOUNTER — Other Ambulatory Visit: Payer: Self-pay | Admitting: *Deleted

## 2014-10-24 MED ORDER — ALPRAZOLAM 0.25 MG PO TABS: ORAL_TABLET | ORAL | Status: DC

## 2014-10-24 NOTE — Telephone Encounter (Signed)
[facility] 

## 2014-10-27 ENCOUNTER — Non-Acute Institutional Stay (SKILLED_NURSING_FACILITY): Payer: Medicare Other | Admitting: Internal Medicine

## 2014-10-27 DIAGNOSIS — M150 Primary generalized (osteo)arthritis: Secondary | ICD-10-CM | POA: Diagnosis not present

## 2014-10-27 DIAGNOSIS — K219 Gastro-esophageal reflux disease without esophagitis: Secondary | ICD-10-CM

## 2014-10-27 DIAGNOSIS — E1151 Type 2 diabetes mellitus with diabetic peripheral angiopathy without gangrene: Secondary | ICD-10-CM

## 2014-10-27 DIAGNOSIS — M159 Polyosteoarthritis, unspecified: Secondary | ICD-10-CM

## 2014-10-27 DIAGNOSIS — E039 Hypothyroidism, unspecified: Secondary | ICD-10-CM

## 2014-10-27 DIAGNOSIS — I798 Other disorders of arteries, arterioles and capillaries in diseases classified elsewhere: Secondary | ICD-10-CM | POA: Diagnosis not present

## 2014-10-27 NOTE — Progress Notes (Signed)
Patient ID: [name] [name], female   DOB: 02/17/1917, 81 y.o.   MRN: [id number]    [facility]  Code status- DNR  Chief complaint- routine visit  Allergies reviewed  HPI 81 y/o female pt is seen for routine visit today. She has been at her baseline with no nursing concerns. She is pleasantly confused. She is up in wheelchair. Joint pain is under control. Diabetes is diet controlled.   Review of Systems   Constitutional: Negative for fever, chills, diaphoresis.   HENT: Negative for congestion.    Eyes: Negative for blurred vision, discharge.   Respiratory: Negative for cough, worsening shortness of breath and wheezing.    Cardiovascular: Negative for chest pain, palpitations Gastrointestinal: Negative for heartburn, nausea, vomiting, abdominal pain Genitourinary: Negative for dysuria.   Musculoskeletal: Negative for back pain, falls Neurological: Negative for dizziness, tingling, focal weakness and headaches.   Psychiatric/Behavioral: Negative for depression   Past medical history reviewed  Medication [facility]. [facility]  Physical exam BP 137/78 mmHg  Pulse 89  Temp(Src) 97 F (36.1 C)  Resp 19  Wt 126 lb 12.8 oz (57.516 kg)  SpO2 97%  Constitutional: Frail, chronically ill appearing, pleasant HEENT: NCAT, perrla, mmm, upper and lower dentures Neck: Neck supple. No JVD present. No thyromegaly present.   Cardiovascular: Normal rate, regular rhythm and diminished distal pulses.    Respiratory: Effort normal and breath sounds normal. No respiratory distress. On o2 by [place]  GI: Soft. Bowel sounds are normal. She exhibits no distension. There is no tenderness.  Musculoskeletal: She exhibits edema. Able to move extremities. Chronic skin changes s/o PVDs. Self propels in wheelchair. compression wraps in both LE Neurological: She is alert and conversational and can make her needs known Skin: Skin is warm and dry.   Labs  reviewed 03/16/13 wbc 5.7, hb 11.3, hct 37.5, plt 301, mcv 95.9, na 138, k 4, bun 28, cr 1.2, glu 105, ca 9.3, a1c 6.3, lft wnl 05/13/13 wbc 5.5, hb 9.7, hct 28.9, plt 240, na 138, k 4.7, bun 26, cr 1.1, glu 197, ca 9.1 06/21/13 b12 722, wbc 5.4, hb 10.8, plt 261 12/01/13 wbc 4.9, hb 10.5, hct 33.6, plt 228, na 139, k 4.4, cl 101, co2 33, bun 32, cr 1.2, glu 108, ca 8.7 01/19/14 a1c 6.7 01/19/14 wbc 4.5, hb 10.3, mcv 100.6 03/02/14 wbc 5.7, hb 10.2, hct 34, mcv 101.5, plt 241, na 142, k 4.3, cl 102, co2 33, bun 43, cr 1.5, glu 136, ca 9.4, vi d 35 03/21/14 wbc 4.4, hb 10.3, hct 34.3, mcv 100.6, plt 192, na 142, k 4, bun 32, cr 1.7, glu 164, ca 8.5, tsh 5.39, a1c 6.6 06/08/14 wbc 5, hb 10.4, hct 33.2, plt 181, na 138, k 4.5, bun 25, cr 1.4, glu 119, a1c 7.2 08/30/14 wbc 4.1, hb 10.9, hct 33.7, plt 217, na 140, k 4.3, bun 18, cr 1.32, glu 109, ca 8.8, t.chol 171, tg 127, ldl 92, hdl 54, tsh 3.391, a1c 6.3  Assessment/Plan  Type 2 dm with PVD Recent a1c 6.3. Off all medication. Diet controlled.   gerd Stable, hx of duodenal ulcer in past and esophageal strictures. Continue prilosec  OA On percocet 5-325 1 tab q6h prn for pain and colace for stool softener. Stable. Continue current regimen  Hypothyroidism Reviewed tsh. Continue levothyroxine current regimen

## 2014-11-14 ENCOUNTER — Other Ambulatory Visit: Payer: Self-pay | Admitting: *Deleted

## 2014-11-14 MED ORDER — ALPRAZOLAM 0.25 MG PO TABS: ORAL_TABLET | ORAL | Status: DC

## 2014-11-14 NOTE — Telephone Encounter (Signed)
[facility] 

## 2014-11-21 ENCOUNTER — Non-Acute Institutional Stay (SKILLED_NURSING_FACILITY): Payer: Medicare Other | Admitting: Internal Medicine

## 2014-11-21 DIAGNOSIS — I5032 Chronic diastolic (congestive) heart failure: Secondary | ICD-10-CM | POA: Insufficient documentation

## 2014-11-21 DIAGNOSIS — J449 Chronic obstructive pulmonary disease, unspecified: Secondary | ICD-10-CM | POA: Diagnosis not present

## 2014-11-21 DIAGNOSIS — I110 Hypertensive heart disease with heart failure: Secondary | ICD-10-CM

## 2014-11-21 DIAGNOSIS — N390 Urinary tract infection, site not specified: Secondary | ICD-10-CM | POA: Diagnosis not present

## 2014-11-21 DIAGNOSIS — D638 Anemia in other chronic diseases classified elsewhere: Secondary | ICD-10-CM | POA: Diagnosis not present

## 2014-11-21 DIAGNOSIS — I509 Heart failure, unspecified: Secondary | ICD-10-CM

## 2014-11-21 DIAGNOSIS — M792 Neuralgia and neuritis, unspecified: Secondary | ICD-10-CM

## 2014-11-21 DIAGNOSIS — E038 Other specified hypothyroidism: Secondary | ICD-10-CM | POA: Diagnosis not present

## 2014-11-21 NOTE — Progress Notes (Signed)
Patient ID: [name] [name], female   DOB: 04/04/1917, 81 y.o.   MRN: [id number]      Facility: [facility]  Code status- DNR  Chief complaint- routine visit  Allergies reviewed  HPI 81 y/o female pt is seen for routine visit today. She has been at her baseline with no nursing concerns. She is pleasantly confused. She is in her bed. Skin tear to the legs have healed.  Review of Systems   Constitutional: Negative for fever, chills, diaphoresis.   HENT: Negative for congestion.    Eyes: Negative for blurred vision, discharge.   Respiratory: Negative for cough, worsening shortness of breath and wheezing.    Cardiovascular: Negative for chest pain, palpitations Gastrointestinal: Negative for heartburn, nausea, vomiting, abdominal pain Genitourinary: Negative for dysuria.   Musculoskeletal: Negative for back pain, falls Neurological: Negative for dizziness, tingling, focal weakness and headaches.   Psychiatric/Behavioral: Negative for depression   Past medical history reviewed  [facility]  Physical exam BP 134/60 mmHg  Pulse 79  Temp(Src) 95.5 F (35.3 C)  Resp 16  Wt 126 lb (57.153 kg)  SpO2 96%  Wt Readings from Last 3 Encounters:  11/21/14 126 lb (57.153 kg)  10/27/14 126 lb 12.8 oz (57.516 kg)  10/28/13 117 lb (53.071 kg)   Constitutional: Frail, chronically ill appearing, pleasant HEENT: NCAT, perrla, mmm, upper and lower dentures Neck: Neck supple. No JVD present. No thyromegaly present.   Cardiovascular: Normal rate, regular rhythm and diminished distal pulses.    Respiratory: Effort normal and breath sounds normal. Occasional wheezing. No respiratory distress. On o2 by [place]  GI: Soft. Bowel sounds are normal. She exhibits no distension. There is no tenderness.  Musculoskeletal: She exhibits edema. Able to move extremities. Chronic skin changes s/o PVDs. Self propels in wheelchair. Has leg  wraps Neurological: She is alert and conversational and can make her needs known Skin: Skin is warm and dry.   Labs reviewed 12/01/13 wbc 4.9, hb 10.5, hct 33.6, plt 228, na 139, k 4.4, cl 101, co2 33, bun 32, cr 1.2, glu 108, ca 8.7 01/19/14 a1c 6.7 01/19/14 wbc 4.5, hb 10.3, mcv 100.6 03/02/14 wbc 5.7, hb 10.2, hct 34, mcv 101.5, plt 241, na 142, k 4.3, cl 102, co2 33, bun 43, cr 1.5, glu 136, ca 9.4, vi d 35 03/21/14 wbc 4.4, hb 10.3, hct 34.3, mcv 100.6, plt 192, na 142, k 4, bun 32, cr 1.7, glu 164, ca 8.5, tsh 5.39, a1c 6.6 06/08/14 wbc 5, hb 10.4, hct 33.2, plt 181, na 138, k 4.5, bun 25, cr 1.4, glu 119, a1c 7.2 08/30/14 wbc 4.1, hb 10.9, hct 33.7, plt 217, na 140, k 4.3, bun 18, cr 1.32, glu 109, ca 8.8, t.chol 171, tg 127, ldl 92, hdl 54, tsh 3.391, a1c 6.3  Assessment/Plan  Hypertensive heart disease Stable reading. Continue clonidine 0.1 mg daily with diltiazem 120 mg daily  Hypothyroidism Reviewed tsh from 08/30/14, stable. Continue levothyroxine 25 mcg daily  Recurrent uti Asymptomatic, continue prophylactic course of bactrim and hydration  Chronic diastolic heart failure Stable. Continue torsemide 40 mg dailykcl 20 meq daily and diltiazem. Monitor weight  Copd with chronic bronchitis Occasional wheezing present. On o2. Continue o2, symbicort bid and prn duoneb, nurse to provide duoneb x 1 now  Neuropathic pain Stable, continue gabapentin 100 mg daily at bedtime  Anemia of chronic disease Low but stable hb on review of lab from 3/16. Continue ferrex 150  mg bid and vitamin b12 1000 mcg daily   [name], MD  [facility] [phone] (Monday-Friday 8 am - 5 pm) [phone] (afterhours)

## 2014-11-24 ENCOUNTER — Other Ambulatory Visit: Payer: Self-pay | Admitting: *Deleted

## 2014-11-24 MED ORDER — OXYCODONE-ACETAMINOPHEN 5-325 MG PO TABS: ORAL_TABLET | ORAL | Status: DC

## 2014-11-24 NOTE — Telephone Encounter (Signed)
[facility] 

## 2014-11-30 LAB — CBC AND DIFFERENTIAL
HEMATOCRIT: 30 % — AB (ref 36–46)
HEMOGLOBIN: 10.2 g/dL — AB (ref 12.0–16.0)
PLATELETS: 218 10*3/uL (ref 150–399)
WBC: 4.5 10*3/mL

## 2014-11-30 LAB — HEMOGLOBIN A1C: Hgb A1c MFr Bld: 6.3 % — AB (ref 4.0–6.0)

## 2014-11-30 LAB — BASIC METABOLIC PANEL
BUN: 25 mg/dL — AB (ref 4–21)
CREATININE: 1.3 mg/dL — AB (ref 0.5–1.1)
GLUCOSE: 99 mg/dL
Potassium: 4.4 mmol/L (ref 3.4–5.3)
Sodium: 141 mmol/L (ref 137–147)

## 2014-11-30 LAB — HEPATIC FUNCTION PANEL
AST: 13 U/L (ref 13–35)
Alkaline Phosphatase: 69 U/L (ref 25–125)
BILIRUBIN, TOTAL: 0.2 mg/dL

## 2014-12-26 ENCOUNTER — Non-Acute Institutional Stay (SKILLED_NURSING_FACILITY): Payer: Medicare Other | Admitting: Internal Medicine

## 2014-12-26 DIAGNOSIS — N189 Chronic kidney disease, unspecified: Secondary | ICD-10-CM

## 2014-12-26 DIAGNOSIS — N184 Chronic kidney disease, stage 4 (severe): Secondary | ICD-10-CM

## 2014-12-26 DIAGNOSIS — E1122 Type 2 diabetes mellitus with diabetic chronic kidney disease: Secondary | ICD-10-CM | POA: Insufficient documentation

## 2014-12-26 DIAGNOSIS — E039 Hypothyroidism, unspecified: Secondary | ICD-10-CM | POA: Diagnosis not present

## 2014-12-26 NOTE — Progress Notes (Signed)
Patient ID: [name] [name], female   DOB: 1917/04/30, 81 y.o.   MRN: [id number]    Facility: [facility]  Code status- DNR  Chief complaint- routine visit  Allergies reviewed  HPI 81 y/o female pt is seen for routine visit today. She has been at her baseline with no nursing concerns. She is pleasantly confused. She has been working with therapy. Weight has been stable. sugar reading suggestive of controlled diabetes. She denies any concerns.   Review of Systems   Constitutional: Negative for fever, chills, diaphoresis.   HENT: Negative for congestion.    Eyes: Negative for blurred vision, discharge.   Respiratory: Negative for cough, worsening shortness of breath and wheezing.    Cardiovascular: Negative for chest pain, palpitations Gastrointestinal: Negative for heartburn, nausea, vomiting, abdominal pain Genitourinary: Negative for dysuria.   Musculoskeletal: Negative for back pain, falls Neurological: Negative for dizziness, tingling, focal weakness and headaches.   Psychiatric/Behavioral: Negative for depression   Past medical history reviewed  Medication reviewed. See [facility]  Physical exam BP 117/77 mmHg  Pulse 76  Temp(Src) 98 F (36.7 C)  Resp 18  Ht 5\' 6"  (1.676 m)  Wt 124 lb 6.4 oz (56.427 kg)  BMI 20.09 kg/m2  SpO2 95%  Wt Readings from Last 3 Encounters:  12/26/14 124 lb 6.4 oz (56.427 kg)  11/21/14 126 lb (57.153 kg)  10/27/14 126 lb 12.8 oz (57.516 kg)   Constitutional: Frail, chronically ill appearing, pleasant HEENT: NCAT, perrla, mmm, upper and lower dentures Neck: Neck supple. No JVD present. No thyromegaly present.   Cardiovascular: Normal rate, regular rhythm and diminished distal pulses.    Respiratory: Effort normal and breath sounds normal. Occasional wheezing. No respiratory distress. On o2 by [place]  GI: Soft. Bowel sounds are normal. She exhibits no distension. There is no tenderness.  Musculoskeletal:  She exhibits edema. Able to move extremities. Chronic skin changes s/o PVDs. Self propels in wheelchair. Has leg wraps Neurological: She is alert and conversational and can make her needs known Skin: Skin is warm and dry.   Labs reviewed 12/01/13 wbc 4.9, hb 10.5, hct 33.6, plt 228, na 139, k 4.4, cl 101, co2 33, bun 32, cr 1.2, glu 108, ca 8.7 01/19/14 a1c 6.7 01/19/14 wbc 4.5, hb 10.3, mcv 100.6 03/02/14 wbc 5.7, hb 10.2, hct 34, mcv 101.5, plt 241, na 142, k 4.3, cl 102, co2 33, bun 43, cr 1.5, glu 136, ca 9.4, vi d 35 03/21/14 wbc 4.4, hb 10.3, hct 34.3, mcv 100.6, plt 192, na 142, k 4, bun 32, cr 1.7, glu 164, ca 8.5, tsh 5.39, a1c 6.6 06/08/14 wbc 5, hb 10.4, hct 33.2, plt 181, na 138, k 4.5, bun 25, cr 1.4, glu 119, a1c 7.2 08/30/14 wbc 4.1, hb 10.9, hct 33.7, plt 217, na 140, k 4.3, bun 18, cr 1.32, glu 109, ca 8.8, t.chol 171, tg 127, ldl 92, hdl 54, tsh 3.391, a1c 6.3 11/30/14 wbc 4.5, hb 10.2, hct 30.5, plt 218, na 141, k 4.4, bun 25, cr 1.28, glu 99, ca 8.6, a1c 6.3  Assessment/Plan  Type 2 dm with renal disease a1c 6.3. Diet controlled, not on ACEI with CKD. ldl at goal. q3 month a1c follow up  Hypothyroidism Reviewed tsh from 08/30/14, stable. Continue levothyroxine 25 mcg daily  ckd stage 4 Monitor renal function   [name], MD  [facility] [phone] (Monday-Friday 8 am - 5 pm) [phone] (afterhours)

## 2015-01-26 ENCOUNTER — Non-Acute Institutional Stay (SKILLED_NURSING_FACILITY): Payer: Medicare Other | Admitting: Internal Medicine

## 2015-01-26 ENCOUNTER — Encounter: Payer: Self-pay | Admitting: Internal Medicine

## 2015-01-26 DIAGNOSIS — K219 Gastro-esophageal reflux disease without esophagitis: Secondary | ICD-10-CM

## 2015-01-26 DIAGNOSIS — E46 Unspecified protein-calorie malnutrition: Secondary | ICD-10-CM | POA: Insufficient documentation

## 2015-01-26 DIAGNOSIS — N189 Chronic kidney disease, unspecified: Secondary | ICD-10-CM | POA: Diagnosis not present

## 2015-01-26 DIAGNOSIS — E1122 Type 2 diabetes mellitus with diabetic chronic kidney disease: Secondary | ICD-10-CM | POA: Diagnosis not present

## 2015-01-26 DIAGNOSIS — F411 Generalized anxiety disorder: Secondary | ICD-10-CM | POA: Diagnosis not present

## 2015-01-26 NOTE — Progress Notes (Signed)
Patient ID: [name] [name], female   DOB: 02/24/1917, 81 y.o.   MRN: [id number]     Facility: [facility]  Code status- DNR  Chief complaint- routine visit  Allergies reviewed  HPI 81 y/o female patient is seen for routine visit today. She has been at her baseline with no nursing concerns. She is pleasantly confused. Denies any concerns this visit. Is on her o2. No new functional decline. No new concern from staff   Review of Systems   Constitutional: Negative for fever, chills, diaphoresis.   HENT: Negative for congestion.    Eyes: Negative for blurred vision, discharge.   Respiratory: Negative for cough, worsening shortness of breath and wheezing.    Cardiovascular: Negative for chest pain, palpitations Gastrointestinal: Negative for heartburn, nausea, vomiting, abdominal pain Genitourinary: Negative for dysuria.   Musculoskeletal: Negative for back pain, falls Neurological: Negative for dizziness, tingling, focal weakness and headaches.   Psychiatric/Behavioral: Negative for depression   Past medical history reviewed  Medication reviewed. See [facility]   Medication List       This list is accurate as of: 01/26/15  3:20 PM.  Always use your most recent med list.               ALPRAZolam 0.25 MG tablet  Commonly known as:  XANAX  Take one tablet by mouth three times daily for anxiety     budesonide-formoterol 80-4.5 MCG/ACT inhaler  Commonly known as:  SYMBICORT  Inhale 2 puffs into the lungs 2 (two) times daily.     cloNIDine 0.1 MG tablet  Commonly known as:  CATAPRES  Take 1 tablet (0.1 mg total) by mouth daily.     dextromethorphan-guaiFENesin 10-100 MG/5ML liquid  Commonly known as:  ROBITUSSIN-DM  2 teaspoons by mouth every 6 hours as needed for cough     diltiazem 120 MG 24 hr capsule  Commonly known as:  CARDIZEM CD  Take 1 capsule (120 mg total) by mouth every morning.     docusate sodium 100 MG capsule    Commonly known as:  COLACE  Take 100 mg by mouth at bedtime.     feeding supplement (PRO-STAT SUGAR FREE 64) Liqd  Take 30 mLs by mouth daily.     gabapentin 100 MG capsule  Commonly known as:  NEURONTIN  Take 100 mg by mouth at bedtime.     hydroxypropyl methylcellulose / hypromellose 2.5 % ophthalmic solution  Commonly known as:  ISOPTO TEARS / GONIOVISC  Place 1 drop into both eyes 2 (two) times daily. For dry eye     ipratropium-albuterol 0.5-2.5 (3) MG/3ML Soln  Commonly known as:  DUONEB  Take 3 mLs by nebulization every 4 (four) hours as needed (wheezing).     iron polysaccharides 150 MG capsule  Commonly known as:  NIFEREX  Take 150 mg by mouth 2 (two) times daily.     levothyroxine 25 MCG tablet  Commonly known as:  SYNTHROID, LEVOTHROID  Take 1 tablet (25 mcg total) by mouth every morning.     mirtazapine 30 MG tablet  Commonly known as:  REMERON  Take 30 mg by mouth at bedtime.     omeprazole 40 MG capsule  Commonly known as:  PRILOSEC  Take 1 capsule (40 mg total) by mouth daily.     oxyCODONE-acetaminophen 5-325 MG per tablet  Commonly known as:  PERCOCET/ROXICET  Take one tablet by mouth every 6 hours as needed for moderate to  severe pain. Do not exceed 4gm of APAP in 24 hours     OXYGEN  Inhale 3 L into the lungs.     potassium chloride SA 20 MEQ tablet  Commonly known as:  K-DUR,KLOR-CON  Take 20 mEq by mouth daily.     QUEtiapine 50 MG tablet  Commonly known as:  SEROQUEL  Take 50 mg by mouth at bedtime. For mood disorder     torsemide 20 MG tablet  Commonly known as:  DEMADEX  Take 40 mg by mouth daily.     trimethoprim 100 MG tablet  Commonly known as:  TRIMPEX  Take 100 mg by mouth daily.     vitamin B-12 1000 MCG tablet  Commonly known as:  CYANOCOBALAMIN  Take 1,000 mcg by mouth daily.     Vitamin D-3 1000 UNITS Caps  Take 1 capsule by mouth daily.        Physical exam BP 119/80 mmHg  Pulse 76  Ht 5\' 6"  (1.676 m)  Wt 123  lb 6.4 oz (55.974 kg)  BMI 19.93 kg/m2  SpO2 99%  Wt Readings from Last 3 Encounters:  01/26/15 123 lb 6.4 oz (55.974 kg)  12/26/14 124 lb 6.4 oz (56.427 kg)  11/21/14 126 lb (57.153 kg)   Constitutional: Frail, chronically ill appearing, pleasant HEENT: NCAT, perrla, mmm, upper and lower dentures Neck: Neck supple. No JVD present. No thyromegaly present.   Cardiovascular: Normal rate, regular rhythm and diminished distal pulses.    Respiratory: Effort normal and breath sounds normal. Occasional wheezing. No respiratory distress. On o2 by [place]  GI: Soft. Bowel sounds are normal. She exhibits no distension. There is no tenderness.  Musculoskeletal: She exhibits edema. Able to move extremities. Chronic skin changes s/o PVDs. Self propels in wheelchair. Has leg wraps Neurological: She is alert and conversational and can make her needs known Skin: Skin is warm and dry.   Labs reviewed 12/01/13 wbc 4.9, hb 10.5, hct 33.6, plt 228, na 139, k 4.4, cl 101, co2 33, bun 32, cr 1.2, glu 108, ca 8.7 01/19/14 a1c 6.7 01/19/14 wbc 4.5, hb 10.3, mcv 100.6 03/02/14 wbc 5.7, hb 10.2, hct 34, mcv 101.5, plt 241, na 142, k 4.3, cl 102, co2 33, bun 43, cr 1.5, glu 136, ca 9.4, vi d 35 03/21/14 wbc 4.4, hb 10.3, hct 34.3, mcv 100.6, plt 192, na 142, k 4, bun 32, cr 1.7, glu 164, ca 8.5, tsh 5.39, a1c 6.6 06/08/14 wbc 5, hb 10.4, hct 33.2, plt 181, na 138, k 4.5, bun 25, cr 1.4, glu 119, a1c 7.2 08/30/14 wbc 4.1, hb 10.9, hct 33.7, plt 217, na 140, k 4.3, bun 18, cr 1.32, glu 109, ca 8.8, t.chol 171, tg 127, ldl 92, hdl 54, tsh 3.391, a1c 6.3 11/30/14 wbc 4.5, hb 10.2, hct 30.5, plt 218, na 141, k 4.4, bun 25, cr 1.28, glu 99, ca 8.6, a1c 6.3  Assessment/Plan  GAD More prominent these days per staff. Currently on xanax 0.25 mg tid. Calm this visit. Monitor, will avoid increasing dose of benzodiazepine if possible given her age  Protein calorie malnutrition Low but stable weight. Monitor po intake. Continue feeding  supplement, vit d, b12 and monitor weight. Also on remeron  gerd Controlled symptoms, on prilosec 40 mg daily, attempt GDR to 20 mg daily and reassess  Type 2 dm with renal disease a1c 6.3. Diet controlled, not on ACEI with CKD. ldl at goal. Check urine microalbumin    Marshae Azam, MD  [place] (Monday-Friday 8 am - 5 pm) [phone] (afterhours)

## 2015-02-10 ENCOUNTER — Other Ambulatory Visit: Payer: Self-pay | Admitting: *Deleted

## 2015-02-10 MED ORDER — OXYCODONE-ACETAMINOPHEN 5-325 MG PO TABS: ORAL_TABLET | ORAL | Status: DC

## 2015-02-10 NOTE — Telephone Encounter (Signed)
[facility] 

## 2015-03-01 ENCOUNTER — Encounter: Payer: Self-pay | Admitting: Internal Medicine

## 2015-03-01 ENCOUNTER — Non-Acute Institutional Stay (SKILLED_NURSING_FACILITY): Payer: Medicare Other | Admitting: Internal Medicine

## 2015-03-01 DIAGNOSIS — R600 Localized edema: Secondary | ICD-10-CM

## 2015-03-01 DIAGNOSIS — I482 Chronic atrial fibrillation, unspecified: Secondary | ICD-10-CM

## 2015-03-01 DIAGNOSIS — G8929 Other chronic pain: Secondary | ICD-10-CM

## 2015-03-01 DIAGNOSIS — J9610 Chronic respiratory failure, unspecified whether with hypoxia or hypercapnia: Secondary | ICD-10-CM | POA: Diagnosis not present

## 2015-03-01 DIAGNOSIS — K219 Gastro-esophageal reflux disease without esophagitis: Secondary | ICD-10-CM | POA: Diagnosis not present

## 2015-03-01 NOTE — Progress Notes (Signed)
Patient ID: [name] [name], female   DOB: [birth year], 81 y.o.   MRN: [id number]     Facility: [facility]  Code status- DNR  Chief complaint- routine visit  Allergies reviewed  HPI 81 y/o female patient is seen for routine visit today. She has been at her baseline with no nursing concerns. she sounds congested in her chest during conversation. She is pleasantly confused. Denies any concerns this visit. Is not on her o2 at present in the hallway. No new functional decline. No new concern from staff   Review of Systems   Constitutional: Negative for fever, chills, diaphoresis.   HENT: Negative for congestion.    Eyes: Negative for blurred vision, discharge.   Respiratory: positive for cough. denies worsening shortness of breath and wheezing.    Cardiovascular: Negative for chest pain, palpitations Gastrointestinal: Negative for heartburn, nausea, vomiting, abdominal pain Genitourinary: Negative for dysuria.   Musculoskeletal: Negative for back pain, falls Neurological: Negative for dizziness, tingling, focal weakness and headaches.   Psychiatric/Behavioral: Negative for depression   Past medical history reviewed  Medication reviewed. See [facility]   Medication List       This list is accurate as of: 03/01/15  2:12 PM.  Always use your most recent med list.               ALPRAZolam 0.25 MG tablet  Commonly known as:  XANAX  Take one tablet by mouth three times daily for anxiety     budesonide-formoterol 80-4.5 MCG/ACT inhaler  Commonly known as:  SYMBICORT  Inhale 2 puffs into the lungs 2 (two) times daily.     cloNIDine 0.1 MG tablet  Commonly known as:  CATAPRES  Take 1 tablet (0.1 mg total) by mouth daily.     dextromethorphan-guaiFENesin 10-100 MG/5ML liquid  Commonly known as:  ROBITUSSIN-DM  2 teaspoons by mouth every 6 hours as needed for cough     diltiazem 120 MG 24 hr capsule  Commonly known as:  CARDIZEM CD  Take 1  capsule (120 mg total) by mouth every morning.     docusate sodium 100 MG capsule  Commonly known as:  COLACE  Take 100 mg by mouth at bedtime.     feeding supplement (PRO-STAT SUGAR FREE 64) Liqd  Take 30 mLs by mouth daily.     gabapentin 100 MG capsule  Commonly known as:  NEURONTIN  Take 100 mg by mouth at bedtime.     hydroxypropyl methylcellulose / hypromellose 2.5 % ophthalmic solution  Commonly known as:  ISOPTO TEARS / GONIOVISC  Place 1 drop into both eyes 2 (two) times daily. For dry eye     iron polysaccharides 150 MG capsule  Commonly known as:  NIFEREX  Take 150 mg by mouth 2 (two) times daily.     levothyroxine 25 MCG tablet  Commonly known as:  SYNTHROID, LEVOTHROID  Take 1 tablet (25 mcg total) by mouth every morning.     mirtazapine 30 MG tablet  Commonly known as:  REMERON  Take 30 mg by mouth at bedtime.     omeprazole 20 MG capsule  Commonly known as:  PRILOSEC  Take 20 mg by mouth daily.     oxyCODONE-acetaminophen 5-325 MG per tablet  Commonly known as:  PERCOCET/ROXICET  1 by mouth twice daily scheduled, 1 by mouth every 6 hours as needed for moderate to severe pain DO NOT EXCEED 4GM OF APAP IN 24 HOURS  OXYGEN  Inhale 3 L into the lungs.     potassium chloride SA 20 MEQ tablet  Commonly known as:  K-DUR,KLOR-CON  Take 20 mEq by mouth daily.     QUEtiapine 50 MG tablet  Commonly known as:  SEROQUEL  Take 50 mg by mouth at bedtime. For mood disorder     torsemide 20 MG tablet  Commonly known as:  DEMADEX  Take 40 mg by mouth daily.     trimethoprim 100 MG tablet  Commonly known as:  TRIMPEX  Take 100 mg by mouth daily.     vitamin B-12 1000 MCG tablet  Commonly known as:  CYANOCOBALAMIN  Take 1,000 mcg by mouth daily.     Vitamin D-3 1000 UNITS Caps  Take 1 capsule by mouth daily.        Physical exam BP 132/82 mmHg  Pulse 80  Temp(Src) 98.1 F (36.7 C) (Oral)  Resp 19  Ht 5\' 6"  (1.676 m)  Wt 123 lb 9.6 oz (56.065 kg)   BMI 19.96 kg/m2  SpO2 96%  Wt Readings from Last 3 Encounters:  03/01/15 123 lb 9.6 oz (56.065 kg)  01/26/15 123 lb 6.4 oz (55.974 kg)  12/26/14 124 lb 6.4 oz (56.427 kg)   Constitutional: Frail, chronically ill appearing, pleasant HEENT: NCAT, perrla, mmm, upper and lower dentures Neck: Neck supple. No JVD present. No thyromegaly present.   Cardiovascular: Normal rate, regular rhythm and diminished distal pulses.    Respiratory: Effort normal and breath sounds normal. Expiratory wheezing present. No respiratory distress. On o2 by [place]  GI: Soft. Bowel sounds are normal. She exhibits no distension. There is no tenderness.  Musculoskeletal: She exhibits edema. Able to move extremities. Chronic skin changes s/o PVDs. Self propels in wheelchair. Has leg wraps Neurological: She is alert and conversational and can make her needs known Skin: Skin is warm and dry.   Labs reviewed 12/01/13 wbc 4.9, hb 10.5, hct 33.6, plt 228, na 139, k 4.4, cl 101, co2 33, bun 32, cr 1.2, glu 108, ca 8.7 01/19/14 a1c 6.7 01/19/14 wbc 4.5, hb 10.3, mcv 100.6 03/02/14 wbc 5.7, hb 10.2, hct 34, mcv 101.5, plt 241, na 142, k 4.3, cl 102, co2 33, bun 43, cr 1.5, glu 136, ca 9.4, vi d 35 03/21/14 wbc 4.4, hb 10.3, hct 34.3, mcv 100.6, plt 192, na 142, k 4, bun 32, cr 1.7, glu 164, ca 8.5, tsh 5.39, a1c 6.6 06/08/14 wbc 5, hb 10.4, hct 33.2, plt 181, na 138, k 4.5, bun 25, cr 1.4, glu 119, a1c 7.2 08/30/14 wbc 4.1, hb 10.9, hct 33.7, plt 217, na 140, k 4.3, bun 18, cr 1.32, glu 109, ca 8.8, t.chol 171, tg 127, ldl 92, hdl 54, tsh 3.391, a1c 6.3 11/30/14 wbc 4.5, hb 10.2, hct 30.5, plt 218, na 141, k 4.4, bun 25, cr 1.28, glu 99, ca 8.6, a1c 6.3  Assessment/Plan  Copd Advanced, continue 02 and encouraged to use it continuously. Continue symbicort and add duoneb tid for 5 days and then bid for her wheezing.  Chronic pain Tolerating percocet 5-325 mg bid with 1 tab q6h prn for pain well. Continue neurontin for now  Chronic leg  edema Continue elg wraps and torsemide 40 mg daily with kcl supplement  gerd Stable, continue prilosec 20 mg daily for now  afib Rate controlled, continue cardizem 120 mg daily. Off anticoagulation with fall risk and dementia  [name], MD  [facility] [phone] (Monday-Friday 8 am -  5 pm) [phone] (afterhours)

## 2015-03-27 ENCOUNTER — Non-Acute Institutional Stay (SKILLED_NURSING_FACILITY): Payer: Medicare Other | Admitting: Internal Medicine

## 2015-03-27 DIAGNOSIS — E039 Hypothyroidism, unspecified: Secondary | ICD-10-CM | POA: Diagnosis not present

## 2015-03-27 DIAGNOSIS — G608 Other hereditary and idiopathic neuropathies: Secondary | ICD-10-CM

## 2015-03-27 DIAGNOSIS — J9611 Chronic respiratory failure with hypoxia: Secondary | ICD-10-CM

## 2015-03-27 DIAGNOSIS — G629 Polyneuropathy, unspecified: Secondary | ICD-10-CM | POA: Diagnosis not present

## 2015-03-27 NOTE — Progress Notes (Signed)
Patient ID: [name] [name], female   DOB: Mar 30, 1917, 81 y.o.   MRN: [id number]      [facility]     [facility] of Service: [facility] ([facility]    PCP: [name], MD   Allergies  Allergen Reactions  . Prednisone Other (See Comments)    Delirium   . Sertraline Hcl Other (See Comments)    Delirium     Chief Complaint  Patient presents with  . Medical Management of Chronic Issues     HPI:  81 y.o. patient is seen for routine visit. She has been at her baseline with no nursing concerns. Skin tear to her arms have resolved. No falls reported. Breathing stable with the o2.    Review of Systems   Constitutional: Negative for fever, chills, diaphoresis.   HENT: Negative for congestion.    Eyes: Negative for blurred vision, discharge.   Respiratory: negative for cough. denies worsening shortness of breath and wheezing.    Cardiovascular: Negative for chest pain, palpitations Gastrointestinal: Negative for heartburn, nausea, vomiting, abdominal pain Genitourinary: Negative for dysuria.   Musculoskeletal: Negative for back pain, falls Neurological: Negative for dizziness, tingling, focal weakness and headaches.   Psychiatric/Behavioral: Negative for depression    Past Medical History  Diagnosis Date  . Routine general medical examination at a health care facility   . Other B-complex deficiencies   . Unspecified hypothyroidism   . Diverticulitis of colon (without mention of hemorrhage)   . Congestive heart failure, unspecified   . Stricture and stenosis of esophagus   . Duodenal ulcer, unspecified as acute or chronic, without hemorrhage, perforation, or obstruction   . Atrial fibrillation   . Edema   . Dysphagia, unspecified(787.20)   . Depressive type psychosis   . Sciatica   . Other constipation   . Unspecified essential hypertension   . Arthritis   . Gallstones   . PONV (postoperative nausea and vomiting)      Past Surgical History  Procedure  Laterality Date  . Abdominal hysterectomy    . Cholecystectomy       Medications:   Medication List       This list is accurate as of: 03/27/15  3:27 PM.  Always use your most recent med list.               ALPRAZolam 0.25 MG tablet  Commonly known as:  XANAX  Take one tablet by mouth three times daily for anxiety     budesonide-formoterol 80-4.5 MCG/ACT inhaler  Commonly known as:  SYMBICORT  Inhale 2 puffs into the lungs 2 (two) times daily.     cloNIDine 0.1 MG tablet  Commonly known as:  CATAPRES  Take 1 tablet (0.1 mg total) by mouth daily.     dextromethorphan-guaiFENesin 10-100 MG/5ML liquid  Commonly known as:  ROBITUSSIN-DM  2 teaspoons by mouth every 6 hours as needed for cough     diltiazem 120 MG 24 hr capsule  Commonly known as:  CARDIZEM CD  Take 1 capsule (120 mg total) by mouth every morning.     docusate sodium 100 MG capsule  Commonly known as:  COLACE  Take 100 mg by mouth at bedtime.     feeding supplement (PRO-STAT SUGAR FREE 64) Liqd  Take 30 mLs by mouth daily.     gabapentin 100 MG capsule  Commonly known as:  NEURONTIN  Take 100 mg by mouth at bedtime.     hydroxypropyl methylcellulose / hypromellose  2.5 % ophthalmic solution  Commonly known as:  ISOPTO TEARS / GONIOVISC  Place 1 drop into both eyes 2 (two) times daily. For dry eye     iron polysaccharides 150 MG capsule  Commonly known as:  NIFEREX  Take 150 mg by mouth 2 (two) times daily.     levothyroxine 25 MCG tablet  Commonly known as:  SYNTHROID, LEVOTHROID  Take 1 tablet (25 mcg total) by mouth every morning.     mirtazapine 30 MG tablet  Commonly known as:  REMERON  Take 30 mg by mouth at bedtime.     omeprazole 20 MG capsule  Commonly known as:  PRILOSEC  Take 20 mg by mouth daily.     oxyCODONE-acetaminophen 5-325 MG per tablet  Commonly known as:  PERCOCET/ROXICET  1 by mouth twice daily scheduled, 1 by mouth every 6 hours as needed for moderate to severe pain  DO NOT EXCEED 4GM OF APAP IN 24 HOURS     OXYGEN  Inhale 3 L into the lungs.     potassium chloride SA 20 MEQ tablet  Commonly known as:  K-DUR,KLOR-CON  Take 20 mEq by mouth daily.     QUEtiapine 50 MG tablet  Commonly known as:  SEROQUEL  Take 50 mg by mouth at bedtime. For mood disorder     torsemide 20 MG tablet  Commonly known as:  DEMADEX  Take 40 mg by mouth daily.     trimethoprim 100 MG tablet  Commonly known as:  TRIMPEX  Take 100 mg by mouth daily.     vitamin B-12 1000 MCG tablet  Commonly known as:  CYANOCOBALAMIN  Take 1,000 mcg by mouth daily.     Vitamin D-3 1000 UNITS Caps  Take 1 capsule by mouth daily.        Immunizations: Immunization History  Administered Date(s) Administered  . Influenza Split 05/02/2011  . Influenza Whole 04/06/2009, 05/26/2012, 04/15/2013  . Influenza-Unspecified 04/11/2014  . PPD Test 11/05/2010, 01/13/2012, 12/04/2012, 12/18/2012, 12/04/2013, 03/15/2014  . Pneumococcal Conjugate-13 12/03/2012  . Pneumococcal Polysaccharide-23 07/22/2012  . Tetanus 07/22/2012    Physical Exam: Filed Vitals:   03/27/15 1527  BP: 116/68  Pulse: 71  Temp: 97 F (36.1 C)  Resp: 18  Weight: 124 lb 9.6 oz (56.518 kg)  SpO2: 95%   Body mass index is 20.12 kg/(m^2).  Constitutional: Frail, chronically ill appearing, pleasant HEENT: NCAT, perrla, mmm, upper and lower dentures Neck: Neck supple. No JVD present. No thyromegaly present.   Cardiovascular: Normal rate, regular rhythm, systolic murmur and diminished distal pulses.    Respiratory: Effort normal and breath sounds normal. No respiratory distress. On o2 by [place]  GI: Soft. Bowel sounds are normal. She exhibits no distension. There is no tenderness.  Musculoskeletal: She exhibits edema. Able to move extremities. Chronic skin changes s/o PVDs. Self propels in wheelchair. Has leg wraps Neurological: She is alert and conversational and can make her needs known Skin: Skin is warm and  dry.   Labs reviewed 12/01/13 wbc 4.9, hb 10.5, hct 33.6, plt 228, na 139, k 4.4, cl 101, co2 33, bun 32, cr 1.2, glu 108, ca 8.7 01/19/14 a1c 6.7 01/19/14 wbc 4.5, hb 10.3, mcv 100.6 03/02/14 wbc 5.7, hb 10.2, hct 34, mcv 101.5, plt 241, na 142, k 4.3, cl 102, co2 33, bun 43, cr 1.5, glu 136, ca 9.4, vi d 35 03/21/14 wbc 4.4, hb 10.3, hct 34.3, mcv 100.6, plt 192, na 142, k 4, bun 32,  cr 1.7, glu 164, ca 8.5, tsh 5.39, a1c 6.6 06/08/14 wbc 5, hb 10.4, hct 33.2, plt 181, na 138, k 4.5, bun 25, cr 1.4, glu 119, a1c 7.2 08/30/14 wbc 4.1, hb 10.9, hct 33.7, plt 217, na 140, k 4.3, bun 18, cr 1.32, glu 109, ca 8.8, t.chol 171, tg 127, ldl 92, hdl 54, tsh 3.391, a1c 6.3 11/30/14 wbc 4.5, hb 10.2, hct 30.5, plt 218, na 141, k 4.4, bun 25, cr 1.28, glu 99, ca 8.6, a1c 6.3  Assessment/Plan  Chronic respiratory failure From copd progression, stable with o2, symbicort for now  hypothyroidism Continue levothyroxine 25 mcg daily, monitor tsh periodically  Neuropathic pain Continue gabapentin 100 mg daily  [name], MD  [facility] [phone] (Monday-Friday 8 am - 5 pm) [phone] (afterhours)

## 2015-05-10 ENCOUNTER — Other Ambulatory Visit: Payer: Self-pay

## 2015-05-10 MED ORDER — OXYCODONE-ACETAMINOPHEN 5-325 MG PO TABS: ORAL_TABLET | ORAL | Status: DC

## 2015-05-10 NOTE — Telephone Encounter (Signed)
Rx faxed to [facility] @ [phone], phone number [phone]  

## 2015-05-15 ENCOUNTER — Other Ambulatory Visit: Payer: Self-pay | Admitting: *Deleted

## 2015-05-15 MED ORDER — ALPRAZOLAM 0.25 MG PO TABS: ORAL_TABLET | ORAL | Status: DC

## 2015-05-15 NOTE — Telephone Encounter (Signed)
[facility] 

## 2015-06-21 LAB — BASIC METABOLIC PANEL
BUN: 33 mg/dL — AB (ref 4–21)
CREATININE: 1.6 mg/dL — AB (ref 0.5–1.1)
GLUCOSE: 109 mg/dL
POTASSIUM: 4.5 mmol/L (ref 3.4–5.3)
SODIUM: 142 mmol/L (ref 137–147)

## 2015-06-21 LAB — CBC AND DIFFERENTIAL
HCT: 31 % — AB (ref 36–46)
HEMOGLOBIN: 9.8 g/dL — AB (ref 12.0–16.0)
Platelets: 225 10*3/uL (ref 150–399)
WBC: 5 10*3/mL

## 2015-08-01 ENCOUNTER — Other Ambulatory Visit: Payer: Self-pay | Admitting: *Deleted

## 2015-08-01 MED ORDER — OXYCODONE-ACETAMINOPHEN 5-325 MG PO TABS: ORAL_TABLET | ORAL | Status: DC

## 2015-08-01 NOTE — Telephone Encounter (Signed)
[facility] 

## 2015-08-03 ENCOUNTER — Encounter: Payer: Self-pay | Admitting: Internal Medicine

## 2015-08-03 ENCOUNTER — Non-Acute Institutional Stay (SKILLED_NURSING_FACILITY): Payer: Medicare Other | Admitting: Internal Medicine

## 2015-08-03 DIAGNOSIS — F411 Generalized anxiety disorder: Secondary | ICD-10-CM

## 2015-08-03 DIAGNOSIS — E46 Unspecified protein-calorie malnutrition: Secondary | ICD-10-CM

## 2015-08-03 DIAGNOSIS — M8580 Other specified disorders of bone density and structure, unspecified site: Secondary | ICD-10-CM | POA: Insufficient documentation

## 2015-08-03 DIAGNOSIS — E038 Other specified hypothyroidism: Secondary | ICD-10-CM

## 2015-08-03 DIAGNOSIS — M792 Neuralgia and neuritis, unspecified: Secondary | ICD-10-CM

## 2015-08-03 DIAGNOSIS — I10 Essential (primary) hypertension: Secondary | ICD-10-CM

## 2015-08-03 DIAGNOSIS — M899 Disorder of bone, unspecified: Secondary | ICD-10-CM

## 2015-08-03 DIAGNOSIS — J449 Chronic obstructive pulmonary disease, unspecified: Secondary | ICD-10-CM | POA: Diagnosis not present

## 2015-08-03 DIAGNOSIS — D509 Iron deficiency anemia, unspecified: Secondary | ICD-10-CM

## 2015-08-03 DIAGNOSIS — Z0000 Encounter for general adult medical examination without abnormal findings: Secondary | ICD-10-CM | POA: Diagnosis not present

## 2015-08-03 DIAGNOSIS — J9611 Chronic respiratory failure with hypoxia: Secondary | ICD-10-CM | POA: Diagnosis not present

## 2015-08-03 DIAGNOSIS — K219 Gastro-esophageal reflux disease without esophagitis: Secondary | ICD-10-CM

## 2015-08-03 NOTE — Progress Notes (Signed)
Patient ID: [name] [name], female   DOB: Nov 17, 1916, 82 y.o.   MRN: [id number]     LOCATION: Facility: [facility]   [name] Grout, MD  Allergies  Allergen Reactions  . Prednisone Other (See Comments)    Delirium   . Sertraline Hcl Other (See Comments)    Delirium      CODE STATUS: DNR  GOALS OF CARE: Advanced Directives 03/01/2015  Does patient have an advance directive? Yes  Type of Advance Directive Out of facility DNR (pink MOST or yellow form)  Copy of advanced directive(s) in chart? Yes  Pre-existing out of facility DNR order (yellow form or pink MOST form) -     Chief Complaint  Patient presents with  . Annual Exam    annual exam    HPI 82 y.o. female seen today for annual wellness exam. She has been at her baseline. She has severe copd and is on chronic oxygen. No recent copd exacerbation in last 6 months. Has tolerated lower dose of prilosec well. Denies any reflux this visit. Has been taking her feeding supplement. BP reading has been stable. uptodate with her immunization. She has dementia with no acute behavior changes. She has been working with restorative team. She has been compliant with her medications. She has lost some weight.   Immunization History  Administered Date(s) Administered  . Influenza Split 05/02/2011  . Influenza Whole 04/06/2009, 05/26/2012, 04/15/2013  . Influenza-Unspecified 04/11/2014, 04/12/2015  . PPD Test 11/05/2010, 01/13/2012, 12/04/2012, 12/18/2012, 12/04/2013, 03/15/2014, 03/07/2015  . Pneumococcal Conjugate-13 12/03/2012  . Pneumococcal Polysaccharide-23 07/22/2012  . Tetanus 07/22/2012    Health Maintenance  Topic Date Due  . HEMOGLOBIN A1C  07/01/2016 (Originally 06/01/2015)  . DEXA SCAN  07/01/2018 (Originally 07/03/1983)  . ZOSTAVAX  07/01/2018 (Originally 07/02/1978)  . OPHTHALMOLOGY EXAM  09/05/2015  . URINE MICROALBUMIN  01/27/2016  . INFLUENZA VACCINE  01/30/2016  . FOOT EXAM   01/31/2016  . TETANUS/TDAP  07/22/2022  . PNA vac Low Risk Adult  Completed    REVIEW OF SYSTEM Constitutional: Negative for fever, chills, diaphoresis.  HENT: Negative for congestion, hearing loss and sore throat.   Eyes: Negative for blurred vision, double vision and discharge.  Respiratory: Negative for cough, sputum production, shortness of breath and wheezing.   Cardiovascular: Negative for chest pain, palpitations. Has chronic leg swelling.  Gastrointestinal: Negative for heartburn, nausea, vomiting, abdominal pain Genitourinary: Negative for dysuria and flank pain.  Musculoskeletal: Negative for back pain, falls in the facility.  Skin: Negative for itching and rash.  Neurological: Negative for dizziness, headaches.  Psychiatric/Behavioral: Negative for depression.    Past Medical History  Diagnosis Date  . Routine general medical examination at a health care facility   . Other B-complex deficiencies   . Unspecified hypothyroidism   . Diverticulitis of colon (without mention of hemorrhage)   . Congestive heart failure, unspecified   . Stricture and stenosis of esophagus   . Duodenal ulcer, unspecified as acute or chronic, without hemorrhage, perforation, or obstruction   . Atrial fibrillation (HCC)   . Edema   . Dysphagia, unspecified(787.20)   . Depressive type psychosis (HCC)   . Sciatica   . Other constipation   . Unspecified essential hypertension   . Arthritis   . Gallstones   . PONV (postoperative nausea and vomiting)     Past Surgical History  Procedure Laterality Date  . Abdominal hysterectomy    . Cholecystectomy      Family History  Problem Relation Age of Onset  . Cancer Other     breast   . Heart disease Other    Social History   Social History  . Marital Status: Widowed    Spouse Name: N/A  . Number of Children: N/A  . Years of Education: N/A   Occupational History  . Not on file.   Social History Main Topics  . Smoking status: Never  Smoker   . Smokeless tobacco: Former Neurosurgeon    Types: Snuff    Quit date: 01/13/1964  . Alcohol Use: No  . Drug Use: No  . Sexual Activity: No   Other Topics Concern  . Not on file   Social History Narrative   Widowed.   Lives with daughter, dependent for ADL's/ Home health coming in (9/09)   Discussed code status: will not do CPR, or mechanical ventilation.       Medication List       This list is accurate as of: 08/03/15  4:16 PM.  Always use your most recent med list.               ALPRAZolam 0.25 MG tablet  Commonly known as:  XANAX  Take one tablet by mouth three times daily for anxiety     budesonide-formoterol 80-4.5 MCG/ACT inhaler  Commonly known as:  SYMBICORT  Inhale 2 puffs into the lungs 2 (two) times daily.     cloNIDine 0.1 MG tablet  Commonly known as:  CATAPRES  Take 1 tablet (0.1 mg total) by mouth daily.     dextromethorphan-guaiFENesin 10-100 MG/5ML liquid  Commonly known as:  ROBITUSSIN-DM  2 teaspoons by mouth every 6 hours as needed for cough     diltiazem 120 MG 24 hr capsule  Commonly known as:  CARDIZEM CD  Take 1 capsule (120 mg total) by mouth every morning.     docusate sodium 100 MG capsule  Commonly known as:  COLACE  Take 100 mg by mouth at bedtime.     feeding supplement (PRO-STAT SUGAR FREE 64) Liqd  Take 30 mLs by mouth daily.     gabapentin 100 MG capsule  Commonly known as:  NEURONTIN  Take 100 mg by mouth at bedtime.     ipratropium-albuterol 0.5-2.5 (3) MG/3ML Soln  Commonly known as:  DUONEB  Take 3 mLs by nebulization 2 (two) times daily as needed.     iron polysaccharides 150 MG capsule  Commonly known as:  NIFEREX  Take 150 mg by mouth 2 (two) times daily.     levothyroxine 25 MCG tablet  Commonly known as:  SYNTHROID, LEVOTHROID  Take 1 tablet (25 mcg total) by mouth every morning.     mirtazapine 30 MG tablet  Commonly known as:  REMERON  Take 30 mg by mouth at bedtime.     omeprazole 20 MG capsule    Commonly known as:  PRILOSEC  Take 20 mg by mouth daily.     oxyCODONE-acetaminophen 5-325 MG tablet  Commonly known as:  PERCOCET/ROXICET  Take one tablet by mouth twice daily for chronic pain. Do not exceed 3gm in 24 hours     OXYGEN  Inhale 3 L into the lungs.     polyvinyl alcohol 1.4 % ophthalmic solution  Commonly known as:  LIQUIFILM TEARS  Place 1 drop into both eyes 2 (two) times daily.     potassium chloride SA 20 MEQ tablet  Commonly known as:  K-DUR,KLOR-CON  Take 20 mEq by mouth  daily.     QUEtiapine 50 MG tablet  Commonly known as:  SEROQUEL  Take 50 mg by mouth at bedtime. For mood disorder     torsemide 20 MG tablet  Commonly known as:  DEMADEX  Take 40 mg by mouth daily.     trimethoprim 100 MG tablet  Commonly known as:  TRIMPEX  Take 100 mg by mouth daily.     UNABLE TO FIND  Med Name: Med Pass give 3 oz (90 mL) prn at resident's request for nutritional support     vitamin B-12 1000 MCG tablet  Commonly known as:  CYANOCOBALAMIN  Take 1,000 mcg by mouth daily.     Vitamin D-3 1000 units Caps  Take 1 capsule by mouth daily.         PHYSICAL EXAM Filed Vitals:   08/03/15 1556  BP: 135/62  Temp: 97.5 F (36.4 C)  TempSrc: Oral  Resp: 19  Height:  (1.676 m)  Weight: 116 lb 12.8 oz (52.98 kg)    Body mass index is 18.86 kg/(m^2).  Wt Readings from Last 3 Encounters:  08/03/15 116 lb 12.8 oz (52.98 kg)  03/27/15 124 lb 9.6 oz (56.518 kg)  03/01/15 123 lb 9.6 oz (56.065 kg)   General- elderly thin built and frail female in no acute distress Head- atraumatic, normocephalic, temporal muscle wasting Eyes- PERRLA, EOMI, no pallor, no icterus, no discharge Ears-normal external ear canal Neck- no lymphadenopathy, no thyromegaly, no jugular vein distension Nose- normal nasal mucosa, no maxillary sinus tenderness, no frontal sinus tenderness Mouth- normal mucus membrane Cardiovascular- normal s1,s2, systolic murmur +, good dorsalis  pedis, compression wrap to both legs, trace edema + Respiratory- diminished air entry, no wheeze, no rhonchi, no crackles, on o2 by nasal canula Abdomen- bowel sounds present, soft, non tender, no guarding or rigidity, no CVA tenderness Musculoskeletal- able to move all 4 extremities, chronic skin changes s/o PVDs. Self propels in wheelchair. Has leg wraps walking upto 300 ft with restorative, kyphosis Neurological: alert and conversational and can make her needs known Skin: warm and dry.   Labs reviewed CBC CBC Latest Ref Rng 06/21/2015 11/30/2014 11/08/2013  WBC - 5.0 4.5 8.1  Hemoglobin 12.0 - 16.0 g/dL 1.6(X) 10.2(A) 11.6(L)  Hematocrit 36 - 46 % 31(A) 30(A) 35.3  Platelets 150 - 399 K/L 225 218 232   CMP Latest Ref Rng 06/21/2015 11/30/2014 11/08/2013  Glucose 65-99 mg/dL - - 096(E)  BUN 4 - 21 mg/dL 45(W) 09(W) 11(B)  Creatinine 0.5 - 1.1 mg/dL 1.4(N) 8.2(N) 5.62(Z)  Sodium 137 - 147 mmol/L 142 141 142  Potassium 3.4 - 5.3 mmol/L 4.5 4.4 3.8  Chloride 98-107 mmol/L - - 105  CO2 21-32 mmol/L - - 30  Calcium 8.5-10.1 mg/dL - - 8.6  Total Protein 6.0 - 8.3 g/dL - - -  Total Bilirubin 0.3 - 1.2 mg/dL - - -  Alkaline Phos 25 - 125 U/L - 69 -  AST 13 - 35 U/L - 13 -  ALT 0 - 35 U/L - - -   Lab Results  Component Value Date   HGBA1C 6.3* 11/30/2014   Lipid Panel     Component Value Date/Time   CHOL 171 08/30/2014   TRIG 127 08/30/2014   HDL 54 08/30/2014   LDLCALC 92 08/30/2014    Assessment/Plan  GAD Continue xanax 0.25 mg tid  Protein calorie malnutrition Followed by dietary, continue feeding supplement, monitor weight, pressure ulcer prophylaxis, encourage eating. Continue remeron  Chronic respiratory failure From copd, stable with o2, symbicort for now  COPD Stable, no recent exacerbation. Continue symbicort, prn duoneb  HTN BP Readings from Last 3 Encounters:  08/03/15 135/62  03/27/15 116/68  03/01/15 132/82  stable, continue clonidine 0.1 mg daily and  cardizem cd 120 mg daily  gerd Stable, remote hx of duodenal ulcer. decrease omeprazole to 10 mg daily and monitor  Hypothyroidism Lab Results  Component Value Date   TSH 3.39 08/30/2014  Continue levothyroxine 25 mcg daily, monitor tsh periodically. Check tsh.  Neuropathic pain Continue gabapentin 100 mg daily  Iron deficency anemia Continue niferex  Annual exam Get tsh checked along with a1c and lipid panel. Reviewed labs and immunization. uptodate with diabetic foot and eye exam on 01/31/15 and 09/05/14 respectively  Senile osteopenia With her age, chronic copd, check vitamin d level. Continue vitamin d.    [name] Grout, MD  [facility] [phone] (Monday-Friday 8 am - 5 pm) [phone] (afterhours)

## 2015-08-04 LAB — BASIC METABOLIC PANEL
BUN: 16 mg/dL (ref 4–21)
Creatinine: 1 mg/dL (ref 0.5–1.1)
Glucose: 82 mg/dL
Potassium: 4.4 mmol/L (ref 3.4–5.3)
SODIUM: 142 mmol/L (ref 137–147)

## 2015-08-04 LAB — LIPID PANEL
CHOLESTEROL: 168 mg/dL (ref 0–200)
HDL: 52 mg/dL (ref 35–70)
LDL Cholesterol: 99 mg/dL
TRIGLYCERIDES: 83 mg/dL (ref 40–160)

## 2015-08-04 LAB — TSH: TSH: 1.97 u[IU]/mL (ref 0.41–5.90)

## 2015-08-04 LAB — HEMOGLOBIN A1C: Hemoglobin A1C: 5.8

## 2015-08-28 ENCOUNTER — Other Ambulatory Visit: Payer: Self-pay | Admitting: *Deleted

## 2015-08-28 MED ORDER — OXYCODONE-ACETAMINOPHEN 5-325 MG PO TABS: ORAL_TABLET | ORAL | Status: DC

## 2015-08-28 NOTE — Telephone Encounter (Signed)
[facility] 

## 2015-09-01 LAB — CBC AND DIFFERENTIAL
HEMATOCRIT: 33 % — AB (ref 36–46)
HEMOGLOBIN: 10.3 g/dL — AB (ref 12.0–16.0)
Platelets: 217 10*3/uL (ref 150–399)
WBC: 4.5 10^3/mL

## 2015-09-01 LAB — MICROALBUMIN, URINE: MICROALB UR: 10.4

## 2015-09-07 ENCOUNTER — Encounter: Payer: Self-pay | Admitting: Internal Medicine

## 2015-09-07 ENCOUNTER — Non-Acute Institutional Stay (SKILLED_NURSING_FACILITY): Payer: Medicare Other | Admitting: Internal Medicine

## 2015-09-07 DIAGNOSIS — E46 Unspecified protein-calorie malnutrition: Secondary | ICD-10-CM

## 2015-09-07 DIAGNOSIS — E038 Other specified hypothyroidism: Secondary | ICD-10-CM

## 2015-09-07 DIAGNOSIS — I10 Essential (primary) hypertension: Secondary | ICD-10-CM | POA: Diagnosis not present

## 2015-09-07 NOTE — Progress Notes (Signed)
Patient ID: [name] [name], female   DOB: 01/23/1917, 82 y.o.   MRN: [id number]     LOCATION: Facility: [facility]   [name], Kaetlyn Noa, MD  Allergies  Allergen Reactions  . Prednisone Other (See Comments)    Delirium   . Sertraline Hcl Other (See Comments)    Delirium      CODE STATUS: DNR  GOALS OF CARE: Advanced Directives 03/01/2015  Does patient have an advance directive? Yes  Type of Advance Directive Out of facility DNR (pink MOST or yellow form)  Copy of advanced directive(s) in chart? Yes  Pre-existing out of facility DNR order (yellow form or pink MOST form) -     Chief Complaint  Patient presents with  . Medical Management of Chronic Issues    Routine Visit    HPI 82 y.o. female seen today for routine visit. She has cellulitis of her right forearm and is being treated for it. Denies any concern this visit. She has been working with restorative team. She has been compliant with her medications. No fall reported. No pressure sores. She continues to lose weight.    REVIEW OF SYSTEM Constitutional: Negative for fever, chills, diaphoresis.  HENT: Negative for congestion   Eyes: Negative for blurred vision Respiratory: Negative for cough, shortness of breath Cardiovascular: Negative for chest pain, palpitations. Has chronic leg swelling.  Gastrointestinal: Negative for heartburn, nausea, vomiting, abdominal pain Genitourinary: Negative for dysuria Musculoskeletal: Negative for back pain Skin: Negative for itching and rash.  Neurological: Negative for dizziness  Past Medical History  Diagnosis Date  . Routine general medical examination at a health care facility   . Other B-complex deficiencies   . Unspecified hypothyroidism   . Diverticulitis of colon (without mention of hemorrhage)   . Congestive heart failure, unspecified   . Stricture and stenosis of esophagus   . Duodenal ulcer, unspecified as acute or chronic, without  hemorrhage, perforation, or obstruction   . Atrial fibrillation (HCC)   . Edema   . Dysphagia, unspecified(787.20)   . Depressive type psychosis (HCC)   . Sciatica   . Other constipation   . Unspecified essential hypertension   . Arthritis   . Gallstones   . PONV (postoperative nausea and vomiting)         Medication List       This list is accurate as of: 09/07/15  3:21 PM.  Always use your most recent med list.               ALPRAZolam 0.25 MG tablet  Commonly known as:  XANAX  Take one tablet by mouth three times daily for anxiety     budesonide-formoterol 80-4.5 MCG/ACT inhaler  Commonly known as:  SYMBICORT  Inhale 2 puffs into the lungs 2 (two) times daily.     cloNIDine 0.1 MG tablet  Commonly known as:  CATAPRES  Take 1 tablet (0.1 mg total) by mouth daily.     dextromethorphan-guaiFENesin 10-100 MG/5ML liquid  Commonly known as:  ROBITUSSIN-DM  2 teaspoons by mouth every 6 hours as needed for cough     diltiazem 120 MG 24 hr capsule  Commonly known as:  CARDIZEM CD  Take 1 capsule (120 mg total) by mouth every morning.     docusate sodium 100 MG capsule  Commonly known as:  COLACE  Take 100 mg by mouth at bedtime.     doxycycline 100 MG EC tablet  Commonly known as:  DORYX  Take  100 mg by mouth every 12 (twelve) hours. Stop date 09/10/15     feeding supplement (PRO-STAT SUGAR FREE 64) Liqd  Take 30 mLs by mouth daily.     gabapentin 100 MG capsule  Commonly known as:  NEURONTIN  Take 100 mg by mouth at bedtime.     ipratropium-albuterol 0.5-2.5 (3) MG/3ML Soln  Commonly known as:  DUONEB  Take 3 mLs by nebulization 2 (two) times daily as needed.     iron polysaccharides 150 MG capsule  Commonly known as:  NIFEREX  Take 150 mg by mouth 2 (two) times daily.     levothyroxine 25 MCG tablet  Commonly known as:  SYNTHROID, LEVOTHROID  Take 1 tablet (25 mcg total) by mouth every morning.     mirtazapine 30 MG tablet  Commonly known as:  REMERON    Take 30 mg by mouth at bedtime.     omeprazole 10 MG capsule  Commonly known as:  PRILOSEC  Take 10 mg by mouth daily.     oxyCODONE-acetaminophen 5-325 MG tablet  Commonly known as:  PERCOCET/ROXICET  Take one tablet by mouth twice daily for chronic pain. Do not exceed 3gm in 24 hours     OXYGEN  Inhale 3 L into the lungs.     polyvinyl alcohol 1.4 % ophthalmic solution  Commonly known as:  LIQUIFILM TEARS  Place 1 drop into both eyes 2 (two) times daily.     potassium chloride SA 20 MEQ tablet  Commonly known as:  K-DUR,KLOR-CON  Take 20 mEq by mouth daily.     QUEtiapine 50 MG tablet  Commonly known as:  SEROQUEL  Take 50 mg by mouth at bedtime. For mood disorder     saccharomyces boulardii 250 MG capsule  Commonly known as:  FLORASTOR  Take 250 mg by mouth daily. Stop date 10/01/15     torsemide 20 MG tablet  Commonly known as:  DEMADEX  Take 40 mg by mouth daily. Reported on 09/07/2015     trimethoprim 100 MG tablet  Commonly known as:  TRIMPEX  Take 100 mg by mouth daily.     UNABLE TO FIND  Med Name: Med Pass give 3 oz (90 mL) prn at resident's request for nutritional support     vitamin B-12 1000 MCG tablet  Commonly known as:  CYANOCOBALAMIN  Take 1,000 mcg by mouth daily.     Vitamin D-3 1000 units Caps  Take 1 capsule by mouth daily.         PHYSICAL EXAM Filed Vitals:   09/07/15 1503  BP: 146/79  Pulse: 68  Temp: 98.1 F (36.7 C)  TempSrc: Oral  Resp: 20  Height:  (1.676 m)  Weight: 106 lb 14.4 oz (48.49 kg)  SpO2: 96%    Body mass index is 17.26 kg/(m^2).  Wt Readings from Last 3 Encounters:  09/07/15 106 lb 14.4 oz (48.49 kg)  08/03/15 116 lb 12.8 oz (52.98 kg)  03/27/15 124 lb 9.6 oz (56.518 kg)   General- elderly thin built and frail female in no acute distress Head- atraumatic, normocephalic Eyes- PERRLA, EOMI, no pallor, no icterus, no discharge Neck- no lymphadenopathy Nose- no maxillary sinus tenderness, no frontal  sinus tenderness Mouth- normal mucus membrane Cardiovascular- normal s1,s2, systolic murmur + Respiratory- diminished air entry, no wheeze, no rhonchi, no crackles, on o2 by nasal canula Abdomen- bowel sounds present, soft, non tender Musculoskeletal- able to move all 4 extremities, chronic skin changes s/o PVDs. Self  propels in wheelchair. Has leg wraps walking upto 300 ft with restorative, kyphosis Neurological: alert and conversational and can make her needs known Skin: warm and dry.   Labs reviewed CBC CBC Latest Ref Rng 09/01/2015 06/21/2015 11/30/2014  WBC - 4.5 5.0 4.5  Hemoglobin 12.0 - 16.0 g/dL 10.3(A) 9.8(A) 10.2(A)  Hematocrit 36 - 46 % 33(A) 31(A) 30(A)  Platelets 150 - 399 K/L 217 225 218   CMP Latest Ref Rng 08/04/2015 06/21/2015 11/30/2014  Glucose 65-99 mg/dL - - -  BUN 4 - 21 mg/dL 16 82(N) 56(O)  Creatinine 0.5 - 1.1 mg/dL 1.0 1.3(Y) 8.6(V)  Sodium 137 - 147 mmol/L 142 142 141  Potassium 3.4 - 5.3 mmol/L 4.4 4.5 4.4  Chloride 98-107 mmol/L - - -  CO2 21-32 mmol/L - - -  Calcium 8.5-10.1 mg/dL - - -  Alkaline Phos 25 - 125 U/L - - 69  AST 13 - 35 U/L - - 13   Lab Results  Component Value Date   HGBA1C 5.8 08/04/2015   Lipid Panel     Component Value Date/Time   CHOL 168 08/04/2015   TRIG 83 08/04/2015   HDL 52 08/04/2015   LDLCALC 99 08/04/2015    Assessment/Plan  Protein calorie malnutrition Followed by dietary, continues to lose weight. Her COPD is contributing to this along with her age and dementia. continue feeding supplement, monitor weight, pressure ulcer prophylaxis, encourage eating. Continue remeron  HTN BP Readings from Last 3 Encounters:  09/07/15 146/79  08/03/15 135/62  03/27/15 116/68  stable, continue clonidine 0.1 mg daily and cardizem cd 120 mg daily  Hypothyroidism Lab Results  Component Value Date   TSH 1.97 08/04/2015  Continue levothyroxine 25 mcg daily, monitor tsh periodically. Check tsh.    [name], MD  [facility]  Adult Medicine [phone] (Monday-Friday 8 am - 5 pm) [phone] (afterhours)

## 2015-09-27 ENCOUNTER — Other Ambulatory Visit: Payer: Self-pay

## 2015-09-27 MED ORDER — OXYCODONE-ACETAMINOPHEN 5-325 MG PO TABS: ORAL_TABLET | ORAL | Status: DC

## 2015-09-27 NOTE — Telephone Encounter (Signed)
Rx faxed to [facility] @ [phone], phone number [phone]  

## 2015-10-09 ENCOUNTER — Non-Acute Institutional Stay (SKILLED_NURSING_FACILITY): Payer: Medicare Other | Admitting: Internal Medicine

## 2015-10-09 ENCOUNTER — Encounter: Payer: Self-pay | Admitting: Internal Medicine

## 2015-10-09 DIAGNOSIS — L989 Disorder of the skin and subcutaneous tissue, unspecified: Secondary | ICD-10-CM | POA: Diagnosis not present

## 2015-10-09 DIAGNOSIS — R238 Other skin changes: Secondary | ICD-10-CM

## 2015-10-09 DIAGNOSIS — N189 Chronic kidney disease, unspecified: Secondary | ICD-10-CM

## 2015-10-09 DIAGNOSIS — D692 Other nonthrombocytopenic purpura: Secondary | ICD-10-CM

## 2015-10-09 DIAGNOSIS — E261 Secondary hyperaldosteronism: Secondary | ICD-10-CM

## 2015-10-09 DIAGNOSIS — D631 Anemia in chronic kidney disease: Secondary | ICD-10-CM

## 2015-10-09 NOTE — Progress Notes (Signed)
Patient ID: [name] [name], female   DOB: 10/22/1916, 82 y.o.   MRN: [id number]     LOCATION: Facility: [facility]   [name] [name], [name] Clagg, MD  Allergies  Allergen Reactions  . Prednisone Other (See Comments)    Delirium   . Sertraline Hcl Other (See Comments)    Delirium      CODE STATUS: DNR  GOALS OF CARE: Advanced Directives 03/01/2015  Does patient have an advance directive? Yes  Type of Advance Directive Out of facility DNR (pink MOST or yellow form)  Copy of advanced directive(s) in chart? Yes  Pre-existing out of facility DNR order (yellow form or pink MOST form) -     Chief Complaint  Patient presents with  . Medical Management of Chronic Issues    Routine Visit    HPI 82 y.o. female seen today for routine visit. She complaints of discomfort to the base of her nose with the oxygen. She denies any dryness or pain inside the nose. She has been working with restorative team.   REVIEW OF SYSTEM Constitutional: Negative for fever HENT: Negative for congestion   Eyes: Negative for blurred vision Respiratory: Negative for cough, shortness of breath Cardiovascular: Negative for chest pain, palpitations. Has chronic leg swelling.  Gastrointestinal: Negative for heartburn, nausea, vomiting, abdominal pain Genitourinary: Negative for dysuria Musculoskeletal: Negative for back pain Skin: Negative for itching and rash.  Neurological: Negative for dizziness   Past Medical History  Diagnosis Date  . Routine general medical examination at a health care facility   . Other B-complex deficiencies   . Unspecified hypothyroidism   . Diverticulitis of colon (without mention of hemorrhage)   . Congestive heart failure, unspecified   . Stricture and stenosis of esophagus   . Duodenal ulcer, unspecified as acute or chronic, without hemorrhage, perforation, or obstruction   . Atrial fibrillation (HCC)   . Edema   . Dysphagia, unspecified(787.20)   .  Depressive type psychosis (HCC)   . Sciatica   . Other constipation   . Unspecified essential hypertension   . Arthritis   . Gallstones   . PONV (postoperative nausea and vomiting)         Medication List       This list is accurate as of: 10/09/15  4:28 PM.  Always use your most recent med list.               ALPRAZolam 0.25 MG tablet  Commonly known as:  XANAX  Take one tablet by mouth three times daily for anxiety     budesonide-formoterol 80-4.5 MCG/ACT inhaler  Commonly known as:  SYMBICORT  Inhale 2 puffs into the lungs 2 (two) times daily.     cloNIDine 0.1 MG tablet  Commonly known as:  CATAPRES  Take 1 tablet (0.1 mg total) by mouth daily.     dextromethorphan-guaiFENesin 10-100 MG/5ML liquid  Commonly known as:  ROBITUSSIN-DM  2 teaspoons by mouth every 6 hours as needed for cough     diltiazem 120 MG 24 hr capsule  Commonly known as:  CARDIZEM CD  Take 1 capsule (120 mg total) by mouth every morning.     docusate sodium 100 MG capsule  Commonly known as:  COLACE  Take 100 mg by mouth at bedtime.     feeding supplement (PRO-STAT SUGAR FREE 64) Liqd  Take 30 mLs by mouth daily.     gabapentin 100 MG capsule  Commonly known as:  NEURONTIN  Take  100 mg by mouth at bedtime.     ipratropium-albuterol 0.5-2.5 (3) MG/3ML Soln  Commonly known as:  DUONEB  Take 3 mLs by nebulization 2 (two) times daily as needed.     iron polysaccharides 150 MG capsule  Commonly known as:  NIFEREX  Take 150 mg by mouth 2 (two) times daily.     levothyroxine 25 MCG tablet  Commonly known as:  SYNTHROID, LEVOTHROID  Take 1 tablet (25 mcg total) by mouth every morning.     mirtazapine 30 MG tablet  Commonly known as:  REMERON  Take 30 mg by mouth at bedtime.     omeprazole 10 MG capsule  Commonly known as:  PRILOSEC  Take 10 mg by mouth daily.     oxyCODONE-acetaminophen 5-325 MG tablet  Commonly known as:  PERCOCET/ROXICET  Take one tablet by mouth twice daily  for chronic pain. Do not exceed 3gm in 24 hours     OXYGEN  Inhale 3 L into the lungs.     polyvinyl alcohol 1.4 % ophthalmic solution  Commonly known as:  LIQUIFILM TEARS  Place 1 drop into both eyes 2 (two) times daily.     potassium chloride SA 20 MEQ tablet  Commonly known as:  K-DUR,KLOR-CON  Take 20 mEq by mouth daily.     QUEtiapine 50 MG tablet  Commonly known as:  SEROQUEL  Take 50 mg by mouth at bedtime. For mood disorder     torsemide 20 MG tablet  Commonly known as:  DEMADEX  Take 40 mg by mouth daily. Reported on 09/07/2015     trimethoprim 100 MG tablet  Commonly known as:  TRIMPEX  Take 100 mg by mouth daily.     UNABLE TO FIND  Med Name: Med Pass 120 mL by  mouth daily     vitamin B-12 1000 MCG tablet  Commonly known as:  CYANOCOBALAMIN  Take 1,000 mcg by mouth daily.     Vitamin D-3 1000 units Caps  Take 1 capsule by mouth daily.         PHYSICAL EXAM Filed Vitals:   10/09/15 1618  BP: 137/48  Pulse: 69  Temp: 97.6 F (36.4 C)  TempSrc: Oral  Resp: 18  Height:  (1.676 m)  Weight: 112 lb (50.803 kg)    Body mass index is 18.09 kg/(m^2).  Wt Readings from Last 3 Encounters:  10/09/15 112 lb (50.803 kg)  09/07/15 106 lb 14.4 oz (48.49 kg)  08/03/15 116 lb 12.8 oz (52.98 kg)   General- elderly thin built and frail female in no acute distress Head- atraumatic, normocephalic Eyes- PERRLA, EOMI, no pallor, no icterus, no discharge Neck- no lymphadenopathy Nose- no maxillary sinus tenderness, no frontal sinus tenderness Mouth- normal mucus membrane Cardiovascular- normal s1,s2, systolic murmur + Respiratory- diminished air entry, no wheeze, no rhonchi, no crackles, on o2 by nasal canula Abdomen- bowel sounds present, soft, non tender Musculoskeletal- able to move all 4 extremities, chronic skin changes s/o PVDs. Self propels in wheelchair. Has leg wraps walking upto 300 ft with restorative, kyphosis Neurological: alert and  conversational and can make her needs known Skin: warm and dry. Mild redness with dry skin to base of her nose, no signs of infection   Labs reviewed CBC CBC Latest Ref Rng 09/01/2015 06/21/2015 11/30/2014  WBC - 4.5 5.0 4.5  Hemoglobin 12.0 - 16.0 g/dL 10.3(A) 9.8(A) 10.2(A)  Hematocrit 36 - 46 % 33(A) 31(A) 30(A)  Platelets 150 - 399 K/L 217  225 218   CMP Latest Ref Rng 08/04/2015 06/21/2015 11/30/2014  Glucose 65-99 mg/dL - - -  BUN 4 - 21 mg/dL 16 16(X) 09(U)  Creatinine 0.5 - 1.1 mg/dL 1.0 0.4(V) 4.0(J)  Sodium 137 - 147 mmol/L 142 142 141  Potassium 3.4 - 5.3 mmol/L 4.4 4.5 4.4  Chloride 98-107 mmol/L - - -  CO2 21-32 mmol/L - - -  Calcium 8.5-10.1 mg/dL - - -  Alkaline Phos 25 - 125 U/L - - 69  AST 13 - 35 U/L - - 13   Lab Results  Component Value Date   HGBA1C 5.8 08/04/2015   Lipid Panel     Component Value Date/Time   CHOL 168 08/04/2015   TRIG 83 08/04/2015   HDL 52 08/04/2015   LDLCALC 99 08/04/2015    Assessment/Plan  Dry skin To floor of the nose bridge. The prongs of her o2 tube could be causing some irritation. No signs of infection. Apply neosporin ointment bid for 5 days, then bid as needed and monitor to help prevent infection with skin breakdown.  afib Rate controlled. Continue cardizem , no changes made  Anemia of ckd Low but stable Hb, monitor periodically and continue iron supplement  Senile purpura No signs of bleed. Skin intact. High risk for skin tears. Monitor clinically.  Secondary hyperaldosteronism Continue torsemide 40 mg daily with ted hose    [name] Grout, MD  [facility] [phone] (Monday-Friday 8 am - 5 pm) [phone] (afterhours)

## 2015-10-30 ENCOUNTER — Non-Acute Institutional Stay (SKILLED_NURSING_FACILITY): Payer: Medicare Other | Admitting: Internal Medicine

## 2015-10-30 ENCOUNTER — Encounter: Payer: Self-pay | Admitting: Internal Medicine

## 2015-10-30 DIAGNOSIS — E46 Unspecified protein-calorie malnutrition: Secondary | ICD-10-CM

## 2015-10-30 DIAGNOSIS — J449 Chronic obstructive pulmonary disease, unspecified: Secondary | ICD-10-CM

## 2015-10-30 DIAGNOSIS — M549 Dorsalgia, unspecified: Secondary | ICD-10-CM | POA: Diagnosis not present

## 2015-10-30 DIAGNOSIS — F39 Unspecified mood [affective] disorder: Secondary | ICD-10-CM

## 2015-10-30 DIAGNOSIS — G8929 Other chronic pain: Secondary | ICD-10-CM | POA: Diagnosis not present

## 2015-10-30 NOTE — Progress Notes (Signed)
Patient ID: [name] [name], female   DOB: 04/29/1917, 82 y.o.   MRN: [id number]     LOCATION: Facility: [facility]   [name] GroutPANDEY, [name], MD  Allergies  Allergen Reactions  . Prednisone Other (See Comments)    Delirium   . Sertraline Hcl Other (See Comments)    Delirium      CODE STATUS: DNR  GOALS OF CARE: Advanced Directives 03/01/2015  Does patient have an advance directive? Yes  Type of Advance Directive Out of facility DNR (pink MOST or yellow form)  Copy of advanced directive(s) in chart? Yes  Pre-existing out of facility DNR order (yellow form or pink MOST form) -     Chief Complaint  Patient presents with  . Medical Management of Chronic Issues    Routine Visit    HPI 82 y.o. female seen today for routine visit. She denies any concern this visit. Her percocet has been helping with her back pain. She has cough during the hsitory taking of wet quality.   REVIEW OF SYSTEM Constitutional: Negative for fever HENT: Negative for congestion   Eyes: Negative for blurred vision Respiratory: Negative for shortness of breath. On chronic o2 Cardiovascular: Negative for chest pain, palpitations. Has chronic leg swelling.  Gastrointestinal: Negative for heartburn, nausea, vomiting, abdominal pain Genitourinary: Negative for dysuria Musculoskeletal: has chronic back pain Skin: Negative for itching and rash.  Neurological: Negative for dizziness   Past Medical History  Diagnosis Date  . Routine general medical examination at a health care facility   . Other B-complex deficiencies   . Unspecified hypothyroidism   . Diverticulitis of colon (without mention of hemorrhage)   . Congestive heart failure, unspecified   . Stricture and stenosis of esophagus   . Duodenal ulcer, unspecified as acute or chronic, without hemorrhage, perforation, or obstruction   . Atrial fibrillation (HCC)   . Edema   . Dysphagia, unspecified(787.20)   . Depressive  type psychosis (HCC)   . Sciatica   . Other constipation   . Unspecified essential hypertension   . Arthritis   . Gallstones   . PONV (postoperative nausea and vomiting)         Medication List       This list is accurate as of: 10/30/15  1:57 PM.  Always use your most recent med list.               ALPRAZolam 0.25 MG tablet  Commonly known as:  XANAX  Take one tablet by mouth three times daily for anxiety     budesonide-formoterol 80-4.5 MCG/ACT inhaler  Commonly known as:  SYMBICORT  Inhale 2 puffs into the lungs 2 (two) times daily.     cloNIDine 0.1 MG tablet  Commonly known as:  CATAPRES  Take 1 tablet (0.1 mg total) by mouth daily.     dextromethorphan-guaiFENesin 10-100 MG/5ML liquid  Commonly known as:  ROBITUSSIN-DM  2 teaspoons by mouth every 6 hours as needed for cough     diltiazem 120 MG 24 hr capsule  Commonly known as:  CARDIZEM CD  Take 1 capsule (120 mg total) by mouth every morning.     docusate sodium 100 MG capsule  Commonly known as:  COLACE  Take 100 mg by mouth at bedtime.     feeding supplement (PRO-STAT SUGAR FREE 64) Liqd  Take 30 mLs by mouth daily.     gabapentin 100 MG capsule  Commonly known as:  NEURONTIN  Take 100 mg  by mouth at bedtime.     ipratropium-albuterol 0.5-2.5 (3) MG/3ML Soln  Commonly known as:  DUONEB  Take 3 mLs by nebulization 2 (two) times daily as needed.     iron polysaccharides 150 MG capsule  Commonly known as:  NIFEREX  Take 150 mg by mouth 2 (two) times daily.     levothyroxine 25 MCG tablet  Commonly known as:  SYNTHROID, LEVOTHROID  Take 1 tablet (25 mcg total) by mouth every morning.     mirtazapine 30 MG tablet  Commonly known as:  REMERON  Take 30 mg by mouth at bedtime.     OCUSOFT LID SCRUB Pads  Apply 2 each topically 2 (two) times daily.     omeprazole 10 MG capsule  Commonly known as:  PRILOSEC  Take 10 mg by mouth daily.     oxyCODONE-acetaminophen 5-325 MG tablet  Commonly known  as:  PERCOCET/ROXICET  Take one tablet by mouth twice daily for chronic pain. Do not exceed 3gm in 24 hours     OXYGEN  Inhale 3 L into the lungs.     polyvinyl alcohol 1.4 % ophthalmic solution  Commonly known as:  LIQUIFILM TEARS  Place 1 drop into both eyes 2 (two) times daily.     potassium chloride SA 20 MEQ tablet  Commonly known as:  K-DUR,KLOR-CON  Take 20 mEq by mouth daily.     QUEtiapine 50 MG tablet  Commonly known as:  SEROQUEL  Take 50 mg by mouth at bedtime. For mood disorder     torsemide 20 MG tablet  Commonly known as:  DEMADEX  Take 40 mg by mouth daily. Reported on 09/07/2015     trimethoprim 100 MG tablet  Commonly known as:  TRIMPEX  Take 100 mg by mouth daily.     UNABLE TO FIND  Med Name: Med Pass 120 mL by  mouth daily     vitamin B-12 1000 MCG tablet  Commonly known as:  CYANOCOBALAMIN  Take 1,000 mcg by mouth daily.     Vitamin D-3 1000 units Caps  Take 1 capsule by mouth daily.         PHYSICAL EXAM Filed Vitals:   10/30/15 1343  BP: 142/75  Pulse: 95  Temp: 98 F (36.7 C)  TempSrc: Oral  Resp: 20  Height:  (1.676 m)  Weight: 112 lb (50.803 kg)  SpO2: 96%    Body mass index is 18.09 kg/(m^2).  Wt Readings from Last 3 Encounters:  10/30/15 112 lb (50.803 kg)  10/09/15 112 lb (50.803 kg)  09/07/15 106 lb 14.4 oz (48.49 kg)   General- elderly thin built and frail female in no acute distress Head- atraumatic, normocephalic Eyes- PERRLA, EOMI, no pallor, no icterus, no discharge Neck- no lymphadenopathy Nose- no nasal discharge Mouth- normal mucus membrane Cardiovascular- normal s1,s2, systolic murmur + Respiratory- diminished air entry, + wheeze, no rhonchi, no crackles, on o2 by nasal canula Abdomen- bowel sounds present, soft, non tender Musculoskeletal- able to move all 4 extremities, chronic skin changes s/o PVDs. Self propels in wheelchair. Has leg wraps Neurological: alert and conversational and can make her needs  known Skin: warm and dry.    Labs reviewed CBC CBC Latest Ref Rng 09/01/2015 06/21/2015 11/30/2014  WBC - 4.5 5.0 4.5  Hemoglobin 12.0 - 16.0 g/dL 10.3(A) 9.8(A) 10.2(A)  Hematocrit 36 - 46 % 33(A) 31(A) 30(A)  Platelets 150 - 399 K/L 217 225 218   CMP Latest Ref Rng 08/04/2015  06/21/2015 11/30/2014  Glucose 65-99 mg/dL - - -  BUN 4 - 21 mg/dL 16 40(J) 81(X)  Creatinine 0.5 - 1.1 mg/dL 1.0 9.1(Y) 7.8(G)  Sodium 137 - 147 mmol/L 142 142 141  Potassium 3.4 - 5.3 mmol/L 4.4 4.5 4.4  Chloride 98-107 mmol/L - - -  CO2 21-32 mmol/L - - -  Calcium 8.5-10.1 mg/dL - - -  Alkaline Phos 25 - 125 U/L - - 69  AST 13 - 35 U/L - - 13   Lab Results  Component Value Date   HGBA1C 5.8 08/04/2015   Lipid Panel     Component Value Date/Time   CHOL 168 08/04/2015   TRIG 83 08/04/2015   HDL 52 08/04/2015   LDLCALC 99 08/04/2015   Lab Results  Component Value Date   TSH 1.97 08/04/2015    Assessment/Plan  Copd With wheezing and increased cough production. Start duoenb tid x 5 days and then continue her bronchodilators. Monitor clinically for worsening signs of exacerbation. Continue o2  Chronic back pain Stable, continue percocet 5-325 mg bid, script with 30 days supply provided  Protein calorie malnutrition Low BMI but stable weight now. Continue fortified food and protein supplement and monitor. Decline anticipated with her severe copd and dementia  Mood disorder On seroquel 50 mg daily, attempt GDR to 37.5 mg daily and monitor. Continue remeron.     [name] Grout, MD  [facility] [phone] (Monday-Friday 8 am - 5 pm) [phone] (afterhours)

## 2015-11-22 LAB — HM DIABETES EYE EXAM

## 2015-11-23 LAB — HEPATIC FUNCTION PANEL
ALT: 12 U/L (ref 7–35)
AST: 19 U/L (ref 13–35)
Alkaline Phosphatase: 76 U/L (ref 25–125)
BILIRUBIN, TOTAL: 0.3 mg/dL

## 2015-11-23 LAB — BASIC METABOLIC PANEL
BUN: 40 mg/dL — AB (ref 4–21)
CREATININE: 1.6 mg/dL — AB (ref 0.5–1.1)
Glucose: 108 mg/dL
Potassium: 4.8 mmol/L (ref 3.4–5.3)
Sodium: 143 mmol/L (ref 137–147)

## 2015-11-23 LAB — CBC AND DIFFERENTIAL
HEMATOCRIT: 31 % — AB (ref 36–46)
HEMOGLOBIN: 9.8 g/dL — AB (ref 12.0–16.0)
PLATELETS: 232 10*3/uL (ref 150–399)
WBC: 4.8 10^3/mL

## 2015-11-28 ENCOUNTER — Other Ambulatory Visit: Payer: Self-pay | Admitting: *Deleted

## 2015-11-28 MED ORDER — OXYCODONE-ACETAMINOPHEN 5-325 MG PO TABS: ORAL_TABLET | ORAL | Status: DC

## 2015-11-28 NOTE — Telephone Encounter (Signed)
[facility] 

## 2015-12-07 ENCOUNTER — Non-Acute Institutional Stay (SKILLED_NURSING_FACILITY): Payer: Medicare Other | Admitting: Internal Medicine

## 2015-12-07 ENCOUNTER — Encounter: Payer: Self-pay | Admitting: Internal Medicine

## 2015-12-07 DIAGNOSIS — F411 Generalized anxiety disorder: Secondary | ICD-10-CM | POA: Diagnosis not present

## 2015-12-07 DIAGNOSIS — M545 Low back pain, unspecified: Secondary | ICD-10-CM | POA: Insufficient documentation

## 2015-12-07 DIAGNOSIS — D509 Iron deficiency anemia, unspecified: Secondary | ICD-10-CM | POA: Diagnosis not present

## 2015-12-07 DIAGNOSIS — G8929 Other chronic pain: Secondary | ICD-10-CM | POA: Diagnosis not present

## 2015-12-07 DIAGNOSIS — S51011S Laceration without foreign body of right elbow, sequela: Secondary | ICD-10-CM

## 2015-12-07 DIAGNOSIS — L03115 Cellulitis of right lower limb: Secondary | ICD-10-CM | POA: Diagnosis not present

## 2015-12-07 NOTE — Progress Notes (Signed)
Patient ID: [name] [name], female   DOB: 02/21/1917, 82 y.o.   MRN: [id number]     LOCATION: Facility: [facility]   [name] [name], [name] Demarais, MD  Allergies  Allergen Reactions  . Prednisone Other (See Comments)    Delirium   . Sertraline Hcl Other (See Comments)    Delirium      CODE STATUS: DNR  GOALS OF CARE: Advanced Directives 03/01/2015  Does patient have an advance directive? Yes  Type of Advance Directive Out of facility DNR (pink MOST or yellow form)  Copy of advanced directive(s) in chart? Yes  Pre-existing out of facility DNR order (yellow form or pink MOST form) -     Chief Complaint  Patient presents with  . Medical Management of Chronic Issues    Routine Visit    HPI 82 y.o. female seen today for routine visit. She complaints of back pain and her feet hurting intermittently. She is getting skin care for skin tear. She is currently on antibiotic for RLE cellulitis. She has been seen by psych services and was tried on zoloft which was discontinued due to allergic reaction. She is now on remeron.    REVIEW OF SYSTEM Constitutional: Negative for fever HENT: Negative for congestion   Eyes: Negative for blurred vision Respiratory: Negative for shortness of breath. On chronic o2 Cardiovascular: Negative for chest pain, palpitations. Has chronic leg swelling.  Gastrointestinal: Negative for heartburn, nausea, vomiting, abdominal pain Genitourinary: Negative for dysuria Musculoskeletal: has chronic back pain Skin: Negative for itching and rash.  Neurological: Negative for dizziness   Past Medical History  Diagnosis Date  . Routine general medical examination at a health care facility   . Other B-complex deficiencies   . Unspecified hypothyroidism   . Diverticulitis of colon (without mention of hemorrhage)   . Congestive heart failure, unspecified   . Stricture and stenosis of esophagus   . Duodenal ulcer, unspecified as acute or  chronic, without hemorrhage, perforation, or obstruction   . Atrial fibrillation (HCC)   . Edema   . Dysphagia, unspecified(787.20)   . Depressive type psychosis (HCC)   . Sciatica   . Other constipation   . Unspecified essential hypertension   . Arthritis   . Gallstones   . PONV (postoperative nausea and vomiting)         Medication List       This list is accurate as of: 12/07/15  3:43 PM.  Always use your most recent med list.               ALPRAZolam 0.25 MG tablet  Commonly known as:  XANAX  Take one tablet by mouth three times daily for anxiety     budesonide-formoterol 80-4.5 MCG/ACT inhaler  Commonly known as:  SYMBICORT  Inhale 2 puffs into the lungs 2 (two) times daily. Reported on 12/07/2015     cloNIDine 0.1 MG tablet  Commonly known as:  CATAPRES  Take 1 tablet (0.1 mg total) by mouth daily.     dextromethorphan-guaiFENesin 10-100 MG/5ML liquid  Commonly known as:  ROBITUSSIN-DM  2 teaspoons by mouth every 6 hours as needed for cough     diltiazem 120 MG 24 hr capsule  Commonly known as:  CARDIZEM CD  Take 1 capsule (120 mg total) by mouth every morning.     docusate sodium 100 MG capsule  Commonly known as:  COLACE  Take 100 mg by mouth at bedtime.     doxycycline 100 MG  EC tablet  Commonly known as:  DORYX  Take 100 mg by mouth 2 (two) times daily. Stop date 12/14/15     feeding supplement (PRO-STAT SUGAR FREE 64) Liqd  Take 30 mLs by mouth daily.     gabapentin 100 MG capsule  Commonly known as:  NEURONTIN  Take 100 mg by mouth at bedtime.     ipratropium-albuterol 0.5-2.5 (3) MG/3ML Soln  Commonly known as:  DUONEB  Take 3 mLs by nebulization 2 (two) times daily as needed.     iron polysaccharides 150 MG capsule  Commonly known as:  NIFEREX  Take 150 mg by mouth 2 (two) times daily.     levothyroxine 25 MCG tablet  Commonly known as:  SYNTHROID, LEVOTHROID  Take 1 tablet (25 mcg total) by mouth every morning.     mirtazapine 30 MG  tablet  Commonly known as:  REMERON  Take 30 mg by mouth at bedtime.     OCUSOFT LID SCRUB Pads  Apply 2 each topically 2 (two) times daily.     omeprazole 10 MG capsule  Commonly known as:  PRILOSEC  Take 10 mg by mouth daily.     oxyCODONE-acetaminophen 5-325 MG tablet  Commonly known as:  PERCOCET/ROXICET  Take one tablet by mouth every 12 hours for pain. Do not exceed 4gm in 24 hours     OXYGEN  Inhale 3 L into the lungs.     polyvinyl alcohol 1.4 % ophthalmic solution  Commonly known as:  LIQUIFILM TEARS  Place 1 drop into both eyes 2 (two) times daily.     potassium chloride SA 20 MEQ tablet  Commonly known as:  K-DUR,KLOR-CON  Take 20 mEq by mouth daily.     QUEtiapine 25 MG tablet  Commonly known as:  SEROQUEL  Take 25 mg by mouth at bedtime. Take 37.5 mg by mouth daily     saccharomyces boulardii 250 MG capsule  Commonly known as:  FLORASTOR  Take 250 mg by mouth 2 (two) times daily. Stop date 12/25/15     torsemide 20 MG tablet  Commonly known as:  DEMADEX  Take 40 mg by mouth daily. Reported on 09/07/2015     trimethoprim 100 MG tablet  Commonly known as:  TRIMPEX  Take 100 mg by mouth daily.     UNABLE TO FIND  Med Name: Med Pass 120 mL by  mouth daily     vitamin B-12 1000 MCG tablet  Commonly known as:  CYANOCOBALAMIN  Take 1,000 mcg by mouth daily.     Vitamin D-3 1000 units Caps  Take 1 capsule by mouth daily.         PHYSICAL EXAM Filed Vitals:   12/07/15 0954  BP: 126/60  Pulse: 96  Temp: 98.6 F (37 C)  TempSrc: Oral  Resp: 20  Height:  (1.676 m)  Weight: 112 lb (50.803 kg)  SpO2: 95%    Body mass index is 18.09 kg/(m^2).  Wt Readings from Last 3 Encounters:  12/07/15 112 lb (50.803 kg)  10/30/15 112 lb (50.803 kg)  10/09/15 112 lb (50.803 kg)   General- elderly thin built and frail female in no acute distress Head- atraumatic, normocephalic Eyes- PERRLA, EOMI, no pallor, no icterus, no discharge Neck- no  lymphadenopathy Nose- no nasal discharge Mouth- normal mucus membrane Cardiovascular- normal s1,s2, systolic murmur + Respiratory- poor air movement, no wheeze, no rhonchi, no crackles, on o2 by nasal canula Abdomen- bowel sounds present, soft, non tender  Musculoskeletal- able to move all 4 extremities, chronic skin changes s/o PVDs. Self propels in wheelchair. Has mild erythema to RLE and skin tear with dressing. Has kyphosis.  Neurological: alert and conversational and can make her needs known    Labs reviewed CBC CBC Latest Ref Rng 11/23/2015 09/01/2015 06/21/2015  WBC - 4.8 4.5 5.0  Hemoglobin 12.0 - 16.0 g/dL 1.6(X) 10.3(A) 9.8(A)  Hematocrit 36 - 46 % 31(A) 33(A) 31(A)  Platelets 150 - 399 K/L 232 217 225   CMP Latest Ref Rng 11/23/2015 08/04/2015 06/21/2015  Glucose 65-99 mg/dL - - -  BUN 4 - 21 mg/dL 09(U) 16 04(V)  Creatinine 0.5 - 1.1 mg/dL 4.0(J) 1.0 8.1(X)  Sodium 137 - 147 mmol/L 143 142 142  Potassium 3.4 - 5.3 mmol/L 4.8 4.4 4.5  Chloride 98-107 mmol/L - - -  CO2 21-32 mmol/L - - -  Calcium 8.5-10.1 mg/dL - - -  Alkaline Phos 25 - 125 U/L 76 - -  AST 13 - 35 U/L 19 - -  ALT 7 - 35 U/L 12 - -   Lab Results  Component Value Date   HGBA1C 5.8 08/04/2015   Lipid Panel     Component Value Date/Time   CHOL 168 08/04/2015   TRIG 83 08/04/2015   HDL 52 08/04/2015   LDLCALC 99 08/04/2015   Lab Results  Component Value Date   TSH 1.97 08/04/2015    Assessment/Plan  Right leg cellulitis Continue and complete doxycycline 100 mg bid for total of 1 week. Continue florastor  Skin tear Continue dressing and skin care for now, monitor for signs of infection  Chronic back pain Currently on percocet 5-325 mg bid. Add tylenol 650 mg bid x 3 days, then bid prn and monitor  GAD Continue xanax 0.25 mg tid with remeron. Continue her seroquel for mood  Iron def anemia Low Hb, monitor, continue ferrex 150 mg bid and monitor cbc    [name] Grout, MD  [facility] [phone] (Monday-Friday 8 am - 5 pm) [phone] (afterhours)

## 2015-12-26 ENCOUNTER — Other Ambulatory Visit: Payer: Self-pay | Admitting: *Deleted

## 2015-12-26 MED ORDER — OXYCODONE-ACETAMINOPHEN 5-325 MG PO TABS: ORAL_TABLET | ORAL | Status: DC

## 2015-12-26 NOTE — Telephone Encounter (Signed)
[facility] 

## 2016-01-10 ENCOUNTER — Non-Acute Institutional Stay (SKILLED_NURSING_FACILITY): Payer: Medicare Other | Admitting: Internal Medicine

## 2016-01-10 ENCOUNTER — Encounter: Payer: Self-pay | Admitting: Internal Medicine

## 2016-01-10 DIAGNOSIS — F329 Major depressive disorder, single episode, unspecified: Secondary | ICD-10-CM | POA: Diagnosis not present

## 2016-01-10 DIAGNOSIS — R3912 Poor urinary stream: Secondary | ICD-10-CM | POA: Diagnosis not present

## 2016-01-10 DIAGNOSIS — D692 Other nonthrombocytopenic purpura: Secondary | ICD-10-CM | POA: Diagnosis not present

## 2016-01-10 NOTE — Progress Notes (Signed)
Patient ID: Durwin Nora, female   DOB: 11/01/1916, 82 y.o.   MRN: [id number]     LOCATION: Facility: [facility]   [name], MD  Allergies  Allergen Reactions  . Prednisone Other (See Comments)    Delirium   . Sertraline Hcl Other (See Comments)    Delirium      CODE STATUS: DNR  GOALS OF CARE: Advanced Directives 01/10/2016  Does patient have an advance directive? Yes  Type of Advance Directive Out of facility DNR (pink MOST or yellow form)  Does patient want to make changes to advanced directive? No - Patient declined  Copy of advanced directive(s) in chart? Yes     Chief Complaint  Patient presents with  . Medical Management of Chronic Issues    Routine Visit    HPI 82 y.o. female seen today for routine visit. She denies any concern this visit.     REVIEW OF SYSTEM Constitutional: Negative for fever HENT: Negative for congestion   Eyes: Negative for blurred vision Respiratory: Negative for shortness of breath. On chronic o2 Cardiovascular: Negative for chest pain, palpitations. Has chronic leg swelling.  Gastrointestinal: Negative for heartburn, nausea, vomiting, abdominal pain Genitourinary: Negative for dysuria Musculoskeletal: has chronic back pain Skin: Negative for itching and rash.  Neurological: Negative for dizziness   Past Medical History  Diagnosis Date  . Routine general medical examination at a health care facility   . Other B-complex deficiencies   . Unspecified hypothyroidism   . Diverticulitis of colon (without mention of hemorrhage)   . Congestive heart failure, unspecified   . Stricture and stenosis of esophagus   . Duodenal ulcer, unspecified as acute or chronic, without hemorrhage, perforation, or obstruction   . Atrial fibrillation (HCC)   . Edema   . Dysphagia, unspecified(787.20)   . Depressive type psychosis (HCC)   . Sciatica   . Other constipation   . Unspecified essential hypertension    . Arthritis   . Gallstones   . PONV (postoperative nausea and vomiting)         Medication List       This list is accurate as of: 01/10/16  3:05 PM.  Always use your most recent med list.               acetaminophen 325 MG tablet  Commonly known as:  TYLENOL  Take 650 mg by mouth 2 (two) times daily as needed for mild pain.     ALPRAZolam 0.25 MG tablet  Commonly known as:  XANAX  Take one tablet by mouth three times daily for anxiety     budesonide-formoterol 80-4.5 MCG/ACT inhaler  Commonly known as:  SYMBICORT  Inhale 2 puffs into the lungs 2 (two) times daily. Reported on 12/07/2015     cloNIDine 0.1 MG tablet  Commonly known as:  CATAPRES  Take 1 tablet (0.1 mg total) by mouth daily.     dextromethorphan-guaiFENesin 10-100 MG/5ML liquid  Commonly known as:  ROBITUSSIN-DM  2 teaspoons by mouth every 6 hours as needed for cough     diltiazem 120 MG 24 hr capsule  Commonly known as:  CARDIZEM CD  Take 1 capsule (120 mg total) by mouth every morning.     docusate sodium 100 MG capsule  Commonly known as:  COLACE  Take 100 mg by mouth at bedtime.     gabapentin 100 MG capsule  Commonly known as:  NEURONTIN  Take 100 mg by mouth at  bedtime.     ipratropium-albuterol 0.5-2.5 (3) MG/3ML Soln  Commonly known as:  DUONEB  Take 3 mLs by nebulization 2 (two) times daily as needed.     iron polysaccharides 150 MG capsule  Commonly known as:  NIFEREX  Take 150 mg by mouth 2 (two) times daily.     levothyroxine 25 MCG tablet  Commonly known as:  SYNTHROID, LEVOTHROID  Take 1 tablet (25 mcg total) by mouth every morning.     mirtazapine 30 MG tablet  Commonly known as:  REMERON  Take 30 mg by mouth at bedtime.     OCUSOFT LID SCRUB Pads  Apply 2 each topically 2 (two) times daily.     omeprazole 10 MG capsule  Commonly known as:  PRILOSEC  Take 10 mg by mouth daily.     oxyCODONE-acetaminophen 5-325 MG tablet  Commonly known as:  PERCOCET/ROXICET  Take  one tablet by mouth every 12 hours for pain. Do not exceed 4gm in 24 hours     OXYGEN  Inhale 3 L into the lungs.     polyvinyl alcohol 1.4 % ophthalmic solution  Commonly known as:  LIQUIFILM TEARS  Place 1 drop into both eyes 3 (three) times daily as needed.     potassium chloride SA 20 MEQ tablet  Commonly known as:  K-DUR,KLOR-CON  Take 20 mEq by mouth daily.     QUEtiapine 25 MG tablet  Commonly known as:  SEROQUEL  Take 25 mg by mouth at bedtime. Take 37.5 mg by mouth daily     torsemide 20 MG tablet  Commonly known as:  DEMADEX  Take 40 mg by mouth daily. Reported on 09/07/2015     trimethoprim 100 MG tablet  Commonly known as:  TRIMPEX  Take 100 mg by mouth daily.     UNABLE TO FIND  Med Name: Med Pass 120 mL by  mouth daily     vitamin B-12 1000 MCG tablet  Commonly known as:  CYANOCOBALAMIN  Take 1,000 mcg by mouth daily.     Vitamin D-3 1000 units Caps  Take 1 capsule by mouth daily.         PHYSICAL EXAM Filed Vitals:   01/10/16 1455  BP: 141/48  Pulse: 73  Temp: 97.4 F (36.3 C)  TempSrc: Oral  Resp: 18  Height: 5\' 6"  (1.676 m)  Weight: 111 lb 12.8 oz (50.712 kg)  SpO2: 92%    Body mass index is 18.05 kg/(m^2).  Wt Readings from Last 3 Encounters:  01/10/16 111 lb 12.8 oz (50.712 kg)  12/07/15 112 lb (50.803 kg)  10/30/15 112 lb (50.803 kg)   General- elderly thin built and frail female in no acute distress Head- atraumatic, normocephalic Eyes- PERRLA, EOMI, no pallor, no icterus, no discharge Neck- no lymphadenopathy Nose- no nasal discharge Mouth- normal mucus membrane Cardiovascular- normal s1,s2, systolic murmur + Respiratory- poor air movement, no wheeze, no rhonchi, no crackles, on o2 by nasal canula Abdomen- bowel sounds present, soft, non tender Musculoskeletal- able to move all 4 extremities, chronic skin changes s/o PVDs. Self propels in wheelchair.kyphosis present Neurological: alert and conversational and can make her needs  known    Labs reviewed CBC CBC Latest Ref Rng 11/23/2015 09/01/2015 06/21/2015  WBC - 4.8 4.5 5.0  Hemoglobin 12.0 - 16.0 g/dL 4.5(W9.8(A) 10.3(A) 9.8(A)  Hematocrit 36 - 46 % 31(A) 33(A) 31(A)  Platelets 150 - 399 K/L 232 217 225   CMP Latest Ref Rng 11/23/2015 08/04/2015 06/21/2015  Glucose 65-99 mg/dL - - -  BUN 4 - 21 mg/dL 40(J) 16 81(X)  Creatinine 0.5 - 1.1 mg/dL 9.1(Y) 1.0 7.8(G)  Sodium 137 - 147 mmol/L 143 142 142  Potassium 3.4 - 5.3 mmol/L 4.8 4.4 4.5  Chloride 98-107 mmol/L - - -  CO2 21-32 mmol/L - - -  Calcium 8.5-10.1 mg/dL - - -  Alkaline Phos 25 - 125 U/L 76 - -  AST 13 - 35 U/L 19 - -  ALT 7 - 35 U/L 12 - -   Lab Results  Component Value Date   HGBA1C 5.8 08/04/2015   Lipid Panel     Component Value Date/Time   CHOL 168 08/04/2015   TRIG 83 08/04/2015   HDL 52 08/04/2015   LDLCALC 99 08/04/2015   Lab Results  Component Value Date   TSH 1.97 08/04/2015    Assessment/Plan  Nonthrombocytopenic purpura Prone to easy bruising. Was recently treated for right leg cellulitis. Continue skin care.   Poor urinary stream S/p surgery in past for urethral stricture. Pending urology appointment  Chronic depression Stable mood continue remeron and her seroquel.   [name], MD  [facility] [phone] (Monday-Friday 8 am - 5 pm) [phone] (afterhours)

## 2016-01-24 ENCOUNTER — Telehealth: Payer: Self-pay | Admitting: Internal Medicine

## 2016-01-24 NOTE — Telephone Encounter (Signed)
New message      The pt was put on Dr. Tenny Craw schedule for tomorrow July 27 th at 12:00pm for surgical clearance and office will fax over notes

## 2016-01-25 ENCOUNTER — Encounter: Payer: Self-pay | Admitting: Internal Medicine

## 2016-01-25 ENCOUNTER — Ambulatory Visit: Payer: Medicaid Other | Admitting: Internal Medicine

## 2016-01-25 ENCOUNTER — Encounter (INDEPENDENT_AMBULATORY_CARE_PROVIDER_SITE_OTHER): Payer: Self-pay

## 2016-01-25 ENCOUNTER — Ambulatory Visit (INDEPENDENT_AMBULATORY_CARE_PROVIDER_SITE_OTHER): Payer: Medicare Other | Admitting: Internal Medicine

## 2016-01-25 VITALS — BP 158/62 | HR 81 | Ht 66.0 in | Wt 125.0 lb

## 2016-01-25 DIAGNOSIS — I5032 Chronic diastolic (congestive) heart failure: Secondary | ICD-10-CM | POA: Diagnosis not present

## 2016-01-25 NOTE — Progress Notes (Addendum)
Cardiology Office Note   Date:  01/25/2016   ID:  [name] [name], DOB 27-Jul-1916, MRN [id number]  PCP:  [name], MD  Cardiologist:   Dietrich Pates, MD   Pt referred for preop cardiac risk stratification.     History of Present Illness: [name] [name] is a 82 y.o. female with a history of CHF, PUD, AFib, dysphagia, diverticultis, edema, depression, htn  [name]   She was previously followed by [facility].  She now is cared for at the senior center   Did have history of signif LE edema  Legs were wrapped in past  This has improved She is not very active  Denies CP  Breathing is OK    She is being considered for urologic surgery       Outpatient Medications Prior to Visit  Medication Sig Dispense Refill  . acetaminophen (TYLENOL) 325 MG tablet Take 650 mg by mouth 2 (two) times daily as needed for mild pain.    Marland Kitchen ALPRAZolam (XANAX) 0.25 MG tablet Take one tablet by mouth three times daily for anxiety 90 tablet 5  . budesonide-formoterol (SYMBICORT) 80-4.5 MCG/ACT inhaler Inhale 2 puffs into the lungs 2 (two) times daily. Reported on 12/07/2015    . Cholecalciferol (VITAMIN D-3) 1000 UNITS CAPS Take 1 capsule by mouth daily.    . cloNIDine (CATAPRES) 0.1 MG tablet Take 1 tablet (0.1 mg total) by mouth daily. 30 tablet 5  . dextromethorphan-guaiFENesin (ROBITUSSIN-DM) 10-100 MG/5ML liquid 2 teaspoons by mouth every 6 hours as needed for cough    . diltiazem (CARDIZEM CD) 120 MG 24 hr capsule Take 1 capsule (120 mg total) by mouth every morning. 30 capsule 6  . docusate sodium (COLACE) 100 MG capsule Take 100 mg by mouth at bedtime.    . Eyelid Cleansers (OCUSOFT LID SCRUB) PADS Apply 2 each topically 2 (two) times daily.    Marland Kitchen gabapentin (NEURONTIN) 100 MG capsule Take 100 mg by mouth at bedtime.    Marland Kitchen ipratropium-albuterol (DUONEB) 0.5-2.5 (3) MG/3ML SOLN Take 3 mLs by nebulization 2 (two) times daily as needed.    . iron polysaccharides  (NIFEREX) 150 MG capsule Take 150 mg by mouth 2 (two) times daily.    Marland Kitchen levothyroxine (SYNTHROID, LEVOTHROID) 25 MCG tablet Take 1 tablet (25 mcg total) by mouth every morning. 30 tablet 5  . mirtazapine (REMERON) 30 MG tablet Take 30 mg by mouth at bedtime.    Marland Kitchen omeprazole (PRILOSEC) 10 MG capsule Take 10 mg by mouth daily.    Marland Kitchen oxyCODONE-acetaminophen (PERCOCET/ROXICET) 5-325 MG tablet Take one tablet by mouth every 12 hours for pain. Do not exceed 4gm in 24 hours 60 tablet 0  . OXYGEN Inhale 3 L into the lungs.    . polyvinyl alcohol (LIQUIFILM TEARS) 1.4 % ophthalmic solution Place 1 drop into both eyes 3 (three) times daily as needed.     . potassium chloride SA (K-DUR,KLOR-CON) 20 MEQ tablet Take 20 mEq by mouth daily.    . QUEtiapine (SEROQUEL) 25 MG tablet Take 25 mg by mouth at bedtime. Take 37.5 mg by mouth daily    . torsemide (DEMADEX) 20 MG tablet Take 40 mg by mouth daily. Reported on 09/07/2015    . trimethoprim (TRIMPEX) 100 MG tablet Take 100 mg by mouth daily.     Marland Kitchen UNABLE TO FIND Med Name: Med Pass 120 mL by  mouth daily    . vitamin B-12 (CYANOCOBALAMIN) 1000  MCG tablet Take 1,000 mcg by mouth daily.     No facility-administered medications prior to visit.      Allergies:   Prednisone and Sertraline hcl   Past Medical History:  Diagnosis Date  . Arthritis   . Atrial fibrillation (HCC)   . Congestive heart failure, unspecified   . Depressive type psychosis (HCC)   . Diverticulitis of colon (without mention of hemorrhage)   . Duodenal ulcer, unspecified as acute or chronic, without hemorrhage, perforation, or obstruction   . Dysphagia, unspecified(787.20)   . Edema   . Gallstones   . Other B-complex deficiencies   . Other constipation   . PONV (postoperative nausea and vomiting)   . Routine general medical examination at a health care facility   . Sciatica   . Stricture and stenosis of esophagus   . Unspecified essential hypertension   . Unspecified  hypothyroidism     Past Surgical History:  Procedure Laterality Date  . ABDOMINAL HYSTERECTOMY    . CHOLECYSTECTOMY       Social History:  The patient  reports that she has never smoked. She quit smokeless tobacco use about 52 years ago. Her smokeless tobacco use included Snuff. She reports that she does not drink alcohol or use drugs.   Family History:  The patient's family history includes Cancer in her other; Heart disease in her other.    ROS:  Please see the history of present illness. All other systems are reviewed and  Negative to the above problem except as noted.    PHYSICAL EXAM: VS:  BP (!) 158/62   Pulse 81   Ht 5\' 6"  (1.676 m)   Wt 125 lb (56.7 kg) Comment: PT daughter stated weight  BMI 20.18 kg/m   VVZ:SMOL 82 yo, in no acute distress   Wearing oxygen   HEENT: normal  Neck: no JVD, carotid bruits, or masses Cardiac: RRR; Gr II/VI systolic murmur LSB  No, rubs, or gallops,Tr edema  Respiratory:  clear to auscultation bilaterally, normal work of breathing GI: soft, nontender, nondistended, + BS  No hepatomegaly  MS: no deformity Moving all extremities   Skin: warm and dry, no rash Neuro:  Strength and sensation are intact Psych: euthymic mood, full affect   EKG:  EKG is ordered today.  SR 81 bpm     Lipid Panel    Component Value Date/Time   CHOL 168 08/04/2015   TRIG 83 08/04/2015   HDL 52 08/04/2015   LDLCALC 99 08/04/2015      Wt Readings from Last 3 Encounters:  01/25/16 125 lb (56.7 kg)  01/10/16 111 lb 12.8 oz (50.7 kg)  12/07/15 112 lb (50.8 kg)      ASSESSMENT AND PLAN:  1  PReop eval  Will need to carlify what surgery she is being evaluated for  She denies CP but activity is very limited Exam with murmur and evid of mild volume overload FIf surgery is not planned I would follow If planned I would recomm an echo to confirm LVEF and eval murmur Will f/u with pt     Signed, Dietrich Pates, MD  01/25/2016 11:58 PM    [facility] [address] [place], [place], [place]  [address] Phone: [phone]; Fax: [phone]

## 2016-01-29 ENCOUNTER — Other Ambulatory Visit: Payer: Self-pay | Admitting: *Deleted

## 2016-01-29 MED ORDER — OXYCODONE-ACETAMINOPHEN 5-325 MG PO TABS: ORAL_TABLET | ORAL | 0 refills | Status: DC

## 2016-01-29 NOTE — Telephone Encounter (Signed)
[facility] [phone] Fax: [phone]  

## 2016-02-07 ENCOUNTER — Encounter: Payer: Self-pay | Admitting: Internal Medicine

## 2016-02-07 ENCOUNTER — Non-Acute Institutional Stay (SKILLED_NURSING_FACILITY): Payer: Medicare Other | Admitting: Internal Medicine

## 2016-02-07 DIAGNOSIS — R600 Localized edema: Secondary | ICD-10-CM

## 2016-02-07 DIAGNOSIS — J449 Chronic obstructive pulmonary disease, unspecified: Secondary | ICD-10-CM

## 2016-02-07 DIAGNOSIS — F411 Generalized anxiety disorder: Secondary | ICD-10-CM | POA: Diagnosis not present

## 2016-02-07 NOTE — Progress Notes (Signed)
Patient ID: [name] [name], female   DOB: [birth year], 82 y.o.   MRN: [id number]     LOCATION: Facility: [facility]   [name] Grout, MD  Allergies  Allergen Reactions  . Prednisone Other (See Comments)    Delirium   . Sertraline Hcl Other (See Comments)    Delirium      CODE STATUS: DNR   GOALS OF CARE: Advanced Directives 01/10/2016  Does patient have an advance directive? Yes  Type of Advance Directive Out of facility DNR (pink MOST or yellow form)  Does patient want to make changes to advanced directive? No - Patient declined  Copy of advanced directive(s) in chart? Yes  Pre-existing out of facility DNR order (yellow form or pink MOST form) -     Chief Complaint  Patient presents with  . Medical Management of Chronic Issues    Routine Visit    HPI 82 y.o. female seen today for routine visit. She denies any concern this visit.  She has been at her baseline. No new concern from nursing staff. She feeds herself. 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    REVIEW OF SYSTEM Constitutional: Negative for fever HENT: Negative for congestion   Eyes: Negative for blurred vision Respiratory: Negative for shortness of breath. On chronic o2 and has chronic cough.  Cardiovascular: Negative for chest pain, palpitations. Has chronic leg swelling.  Gastrointestinal: Negative for heartburn, nausea, vomiting, abdominal pain Genitourinary: Negative for dysuria Musculoskeletal: has chronic back pain and has arthritis changes to her fingers Skin: Negative for itching and rash.  Neurological: Negative for dizziness   Past Medical History:  Diagnosis Date  . Arthritis   . Atrial fibrillation (HCC)   . Congestive heart failure, unspecified   . Depressive type psychosis (HCC)   . Diverticulitis of colon (without mention of hemorrhage)   . Duodenal ulcer, unspecified as acute or chronic, without hemorrhage,  perforation, or obstruction   . Dysphagia, unspecified(787.20)   . Edema   . Gallstones   . Other B-complex deficiencies   . Other constipation   . PONV (postoperative nausea and vomiting)   . Routine general medical examination at a health care facility   . Sciatica   . Stricture and stenosis of esophagus   . Unspecified essential hypertension   . Unspecified hypothyroidism         Medication List       Accurate as of 02/07/16 12:18 PM. Always use your most recent med list.          acetaminophen 325 MG tablet Commonly known as:  TYLENOL Take 650 mg by mouth 2 (two) times daily as needed for mild pain.   ALPRAZolam 0.25 MG tablet Commonly known as:  XANAX Take one tablet by mouth three times daily for anxiety   budesonide-formoterol 80-4.5 MCG/ACT inhaler Commonly known as:  SYMBICORT Inhale 2 puffs into the lungs 2 (two) times daily. Reported on 12/07/2015   cloNIDine 0.1 MG tablet Commonly known as:  CATAPRES Take 1 tablet (0.1 mg total) by mouth daily.   dextromethorphan-guaiFENesin 10-100 MG/5ML liquid Commonly known as:  ROBITUSSIN-DM 2 teaspoons by mouth every 6 hours as needed for cough   diltiazem 120 MG 24 hr capsule Commonly known as:  CARDIZEM CD Take 1 capsule (120 mg total) by mouth every morning.   docusate sodium 100 MG capsule Commonly known as:  COLACE Take 100 mg by mouth at bedtime.  gabapentin 100 MG capsule Commonly known as:  NEURONTIN Take 100 mg by mouth at bedtime.   ipratropium-albuterol 0.5-2.5 (3) MG/3ML Soln Commonly known as:  DUONEB Take 3 mLs by nebulization 2 (two) times daily as needed.   iron polysaccharides 150 MG capsule Commonly known as:  NIFEREX Take 150 mg by mouth 2 (two) times daily.   levothyroxine 25 MCG tablet Commonly known as:  SYNTHROID, LEVOTHROID Take 1 tablet (25 mcg total) by mouth every morning.   mirtazapine 30 MG tablet Commonly known as:  REMERON Take 30 mg by mouth at bedtime.   OCUSOFT LID  SCRUB Pads Apply 2 each topically 2 (two) times daily.   omeprazole 10 MG capsule Commonly known as:  PRILOSEC Take 10 mg by mouth daily.   oxyCODONE-acetaminophen 5-325 MG tablet Commonly known as:  PERCOCET/ROXICET Take one tablet by mouth every 12 hours for pain. Do not exceed 4gm in 24 hours   OXYGEN Inhale 3 L into the lungs.   polyvinyl alcohol 1.4 % ophthalmic solution Commonly known as:  LIQUIFILM TEARS Place 1 drop into both eyes 3 (three) times daily as needed.   potassium chloride SA 20 MEQ tablet Commonly known as:  K-DUR,KLOR-CON Take 20 mEq by mouth daily.   QUEtiapine 25 MG tablet Commonly known as:  SEROQUEL Take 25 mg by mouth at bedtime. Take 37.5 mg by mouth daily   torsemide 20 MG tablet Commonly known as:  DEMADEX Take 40 mg by mouth daily. Reported on 09/07/2015   trimethoprim 100 MG tablet Commonly known as:  TRIMPEX Take 100 mg by mouth daily.   UNABLE TO FIND Med Name: Med Pass 120 mL by  mouth daily   vitamin B-12 1000 MCG tablet Commonly known as:  CYANOCOBALAMIN Take 1,000 mcg by mouth daily.   Vitamin D-3 1000 units Caps Take 1 capsule by mouth daily.        PHYSICAL EXAM Vitals:   02/07/16 1210  BP: 136/66  Pulse: 71  Resp: 19  Temp: 97.8 F (36.6 C)  TempSrc: Oral  SpO2: 99%  Weight: 108 lb (49 kg)  Height: 5\' 6"  (1.676 m)    Body mass index is 17.43 kg/m.  Wt Readings from Last 3 Encounters:  02/07/16 108 lb (49 kg)  01/25/16 125 lb (56.7 kg)  01/10/16 111 lb 12.8 oz (50.7 kg)   General- elderly thin built and frail female in no acute distress Head- atraumatic, normocephalic Eyes- PERRLA, EOMI, no pallor, no icterus, no discharge Neck- no lymphadenopathy Nose- no nasal discharge Mouth- normal mucus membrane Cardiovascular- normal s1,s2, systolic murmur + Respiratory- poor air movement, no wheeze, no rhonchi, no crackles, on o2 by nasal canula Abdomen- bowel sounds present, soft, non tender Musculoskeletal-  able to move all 4 extremities, chronic skin changes. Self propels in wheelchair.kyphosis present Neurological: alert and conversational and can make her needs known    Labs reviewed CBC CBC Latest Ref Rng & Units 11/23/2015 09/01/2015 06/21/2015  WBC 10:3/mL 4.8 4.5 5.0  Hemoglobin 12.0 - 16.0 g/dL 1.6(X) 10.3(A) 9.8(A)  Hematocrit 36 - 46 % 31(A) 33(A) 31(A)  Platelets 150 - 399 K/L 232 217 225   CMP Latest Ref Rng & Units 11/23/2015 08/04/2015 06/21/2015  Glucose 65 - 99 mg/dL - - -  BUN 4 - 21 mg/dL 09(U) 16 04(V)  Creatinine 0.5 - 1.1 mg/dL 4.0(J) 1.0 8.1(X)  Sodium 137 - 147 mmol/L 143 142 142  Potassium 3.4 - 5.3 mmol/L 4.8 4.4 4.5  Chloride  98 - 107 mmol/L - - -  CO2 21 - 32 mmol/L - - -  Calcium 8.5 - 10.1 mg/dL - - -  Total Protein 6.0 - 8.3 g/dL - - -  Total Bilirubin 0.3 - 1.2 mg/dL - - -  Alkaline Phos 25 - 125 U/L 76 - -  AST 13 - 35 U/L 19 - -  ALT 7 - 35 U/L 12 - -   Lab Results  Component Value Date   HGBA1C 5.8 08/04/2015   Lipid Panel     Component Value Date/Time   CHOL 168 08/04/2015   TRIG 83 08/04/2015   HDL 52 08/04/2015   LDLCALC 99 08/04/2015   Lab Results  Component Value Date   TSH 1.97 08/04/2015    Assessment/Plan  GAD Continue xanax 0.25 mg tid and monitor  Chronic leg edema With PVDs. continue torsemide 40 mg daily  Copd Continue o2 and bronchodilators, monitor breathing status. Continue robitussin DM q6h prn cough.   [name] GroutMAHIMA Marsean Elkhatib, MD  [facility] [phone] (Monday-Friday 8 am - 5 pm) [phone] (afterhours)

## 2016-02-27 LAB — BASIC METABOLIC PANEL
BUN: 37 mg/dL — AB (ref 4–21)
Creatinine: 1.4 mg/dL — AB (ref 0.5–1.1)
Glucose: 138 mg/dL
POTASSIUM: 4.3 mmol/L (ref 3.4–5.3)
SODIUM: 143 mmol/L (ref 137–147)

## 2016-02-27 LAB — LIPID PANEL
CHOLESTEROL: 161 mg/dL (ref 0–200)
HDL: 46 mg/dL (ref 35–70)
LDL Cholesterol: 93 mg/dL
TRIGLYCERIDES: 111 mg/dL (ref 40–160)

## 2016-02-27 LAB — CBC AND DIFFERENTIAL
HEMATOCRIT: 35 % — AB (ref 36–46)
HEMOGLOBIN: 11.1 g/dL — AB (ref 12.0–16.0)
Platelets: 212 10*3/uL (ref 150–399)
WBC: 6 10^3/mL

## 2016-02-27 LAB — HEMOGLOBIN A1C: Hemoglobin A1C: 6.2

## 2016-03-13 ENCOUNTER — Non-Acute Institutional Stay (SKILLED_NURSING_FACILITY): Payer: Medicare Other | Admitting: Internal Medicine

## 2016-03-13 ENCOUNTER — Encounter: Payer: Self-pay | Admitting: Internal Medicine

## 2016-03-13 DIAGNOSIS — F329 Major depressive disorder, single episode, unspecified: Secondary | ICD-10-CM

## 2016-03-13 DIAGNOSIS — D509 Iron deficiency anemia, unspecified: Secondary | ICD-10-CM | POA: Diagnosis not present

## 2016-03-13 DIAGNOSIS — E038 Other specified hypothyroidism: Secondary | ICD-10-CM

## 2016-03-13 DIAGNOSIS — E43 Unspecified severe protein-calorie malnutrition: Secondary | ICD-10-CM

## 2016-03-13 DIAGNOSIS — M159 Polyosteoarthritis, unspecified: Secondary | ICD-10-CM | POA: Diagnosis not present

## 2016-03-13 NOTE — Progress Notes (Signed)
Patient ID: [name] [name], female   DOB: 10/03/1916, 82 y.o.   MRN: [id number]     LOCATION: Facility: [facility]   [name] [name], [name] Plata, MD  Allergies  Allergen Reactions  . Prednisone Other (See Comments)    Delirium   . Sertraline Hcl Other (See Comments)    Delirium      CODE STATUS: DNR   GOALS OF CARE: Advanced Directives 01/10/2016  Does patient have an advance directive? Yes  Type of Advance Directive Out of facility DNR (pink MOST or yellow form)  Does patient want to make changes to advanced directive? No - Patient declined  Copy of advanced directive(s) in chart? Yes  Pre-existing out of facility DNR order (yellow form or pink MOST form) -     Chief Complaint  Patient presents with  . Medical Management of Chronic Issues    Routine Visit    HPI 82 y.o. female seen today for routine visit. She denies any concern this visit.  Her breathing has been stable with oxygen. She has been at her baseline. No new concern from nursing staff. She feeds herself. 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    REVIEW OF SYSTEM Constitutional: Negative for fever HENT: Negative for congestion   Eyes: Negative for blurred vision Respiratory: Negative for shortness of breath. On chronic o2 and has chronic cough.  Cardiovascular: Negative for chest pain, palpitations. Has chronic leg swelling.  Gastrointestinal: Negative for heartburn, nausea, vomiting, abdominal pain. Had bowel movement this am Genitourinary: Negative for dysuria Musculoskeletal: has chronic back pain and has arthritis changes to her fingers Skin: Negative for itching and rash.  Neurological: Negative for dizziness   Past Medical History:  Diagnosis Date  . Arthritis   . Atrial fibrillation (HCC)   . Congestive heart failure, unspecified   . Depressive type psychosis (HCC)   . Diverticulitis of colon (without mention of hemorrhage)   .  Duodenal ulcer, unspecified as acute or chronic, without hemorrhage, perforation, or obstruction   . Dysphagia, unspecified(787.20)   . Edema   . Gallstones   . Other B-complex deficiencies   . Other constipation   . PONV (postoperative nausea and vomiting)   . Routine general medical examination at a health care facility   . Sciatica   . Stricture and stenosis of esophagus   . Unspecified essential hypertension   . Unspecified hypothyroidism         Medication List       Accurate as of 03/13/16  1:16 PM. Always use your most recent med list.          acetaminophen 325 MG tablet Commonly known as:  TYLENOL Take 650 mg by mouth 2 (two) times daily as needed for mild pain.   ALPRAZolam 0.25 MG tablet Commonly known as:  XANAX Take one tablet by mouth three times daily for anxiety   budesonide-formoterol 80-4.5 MCG/ACT inhaler Commonly known as:  SYMBICORT Inhale 2 puffs into the lungs 2 (two) times daily. Reported on 12/07/2015   cloNIDine 0.1 MG tablet Commonly known as:  CATAPRES Take 1 tablet (0.1 mg total) by mouth daily.   dextromethorphan-guaiFENesin 10-100 MG/5ML liquid Commonly known as:  ROBITUSSIN-DM 2 teaspoons by mouth every 6 hours as needed for cough   diltiazem 120 MG 24 hr capsule Commonly known as:  CARDIZEM CD Take 1 capsule (120 mg total) by mouth every morning.   docusate sodium 100 MG capsule  Commonly known as:  COLACE Take 100 mg by mouth at bedtime.   gabapentin 100 MG capsule Commonly known as:  NEURONTIN Take 100 mg by mouth at bedtime.   ipratropium-albuterol 0.5-2.5 (3) MG/3ML Soln Commonly known as:  DUONEB Take 3 mLs by nebulization 2 (two) times daily as needed.   iron polysaccharides 150 MG capsule Commonly known as:  NIFEREX Take 150 mg by mouth 2 (two) times daily.   levothyroxine 25 MCG tablet Commonly known as:  SYNTHROID, LEVOTHROID Take 1 tablet (25 mcg total) by mouth every morning.   mirtazapine 30 MG  tablet Commonly known as:  REMERON Take 30 mg by mouth at bedtime.   OCUSOFT LID SCRUB Pads Apply 2 each topically 2 (two) times daily.   omeprazole 10 MG capsule Commonly known as:  PRILOSEC Take 10 mg by mouth daily.   oxyCODONE-acetaminophen 5-325 MG tablet Commonly known as:  PERCOCET/ROXICET Take one tablet by mouth every 12 hours for pain. Do not exceed 4gm in 24 hours   OXYGEN Inhale 3 L into the lungs.   polyvinyl alcohol 1.4 % ophthalmic solution Commonly known as:  LIQUIFILM TEARS Place 1 drop into both eyes 3 (three) times daily as needed.   potassium chloride SA 20 MEQ tablet Commonly known as:  K-DUR,KLOR-CON Take 20 mEq by mouth daily.   QUEtiapine 25 MG tablet Commonly known as:  SEROQUEL Take 25 mg by mouth at bedtime. Take 37.5 mg by mouth daily   torsemide 20 MG tablet Commonly known as:  DEMADEX Take 40 mg by mouth daily. Reported on 09/07/2015   trimethoprim 100 MG tablet Commonly known as:  TRIMPEX Take 100 mg by mouth daily.   UNABLE TO FIND Med Name: Med Pass 120 mL by  mouth daily   vitamin B-12 1000 MCG tablet Commonly known as:  CYANOCOBALAMIN Take 1,000 mcg by mouth daily.   Vitamin D-3 1000 units Caps Take 1 capsule by mouth daily.        PHYSICAL EXAM Vitals:   03/13/16 1305  BP: (!) 163/61  Pulse: 70  Resp: 16  Temp: 97.5 F (36.4 C)  TempSrc: Oral  SpO2: 100%  Weight: 108 lb (49 kg)  Height: 5\' 6"  (1.676 m)    Body mass index is 17.43 kg/m.  Wt Readings from Last 3 Encounters:  03/13/16 108 lb (49 kg)  02/07/16 108 lb (49 kg)  01/25/16 125 lb (56.7 kg)   General- elderly thin built and frail female in no acute distress Head- atraumatic, normocephalic Eyes- EOMI, no pallor, no icterus, no discharge Neck- no lymphadenopathy Nose- no nasal discharge Mouth- normal mucus membrane Cardiovascular- normal s1,s2, systolic murmur + Respiratory- poor air movement, no wheeze, no rhonchi, no crackles, on o2 by nasal  canula Abdomen- bowel sounds present, soft, non tender Musculoskeletal- able to move all 4 extremities, chronic skin changes. Self propels in wheelchair, kyphosis present Neurological: alert and conversational and can make her needs known    Labs reviewed CBC CBC Latest Ref Rng & Units 02/27/2016 11/23/2015 09/01/2015  WBC 10:3/mL 6.0 4.8 4.5  Hemoglobin 12.0 - 16.0 g/dL 11.1(A) 9.8(A) 10.3(A)  Hematocrit 36 - 46 % 35(A) 31(A) 33(A)  Platelets 150 - 399 K/L 212 232 217   CMP Latest Ref Rng & Units 02/27/2016 11/23/2015 08/04/2015  Glucose 65 - 99 mg/dL - - -  BUN 4 - 21 mg/dL 16(X) 09(U) 16  Creatinine 0.5 - 1.1 mg/dL 0.4(V) 4.0(J) 1.0  Sodium 137 - 147 mmol/L 143  143 142  Potassium 3.4 - 5.3 mmol/L 4.3 4.8 4.4  Chloride 98 - 107 mmol/L - - -  CO2 21 - 32 mmol/L - - -  Calcium 8.5 - 10.1 mg/dL - - -  Total Protein 6.0 - 8.3 g/dL - - -  Total Bilirubin 0.3 - 1.2 mg/dL - - -  Alkaline Phos 25 - 125 U/L - 76 -  AST 13 - 35 U/L - 19 -  ALT 7 - 35 U/L - 12 -   Lab Results  Component Value Date   HGBA1C 6.2 02/27/2016   Lipid Panel     Component Value Date/Time   CHOL 161 02/27/2016   TRIG 111 02/27/2016   HDL 46 02/27/2016   LDLCALC 93 02/27/2016   Lab Results  Component Value Date   TSH 1.97 08/04/2015    Assessment/Plan  Severe protein calorie malnutrition Monitor her weight, has been losing weight. Her advanced copd is contributing some. RD to evaluate. Change her medpass to 120 cc bid for now.   Generalized OA Currently on percocet 5-325 mg bid and tylenol 650 mg bid prn. D/c percocet. Have her on percocet 5-325 mg once daily now and change tylenol to 650 mg tid and monitor  Chronic depression Stable mood. Continue remeron 30 mg daily and seroqeul 25 mg daily  lron def anemia Continue niferex 150 mg bid and monitor cbc periodically  Hypothyroidism Lab Results  Component Value Date   TSH 1.97 08/04/2015   Check tsh. Continue levothyroxine 25 mcg  daily   [name] Grout, MD Internal Medicine [facility] [address] [place], [place] [address] Cell Phone (Monday-Friday 8 am - 5 pm): [phone] On Call: [phone] and follow prompts after 5 pm and on weekends Office Phone: [phone] Office Fax: [phone]

## 2016-03-14 LAB — TSH: TSH: 3.9 u[IU]/mL (ref 0.41–5.90)

## 2016-04-02 ENCOUNTER — Other Ambulatory Visit: Payer: Self-pay | Admitting: *Deleted

## 2016-04-02 MED ORDER — OXYCODONE-ACETAMINOPHEN 5-325 MG PO TABS: ORAL_TABLET | ORAL | 0 refills | Status: DC

## 2016-04-02 NOTE — Telephone Encounter (Signed)
[facility] [phone] Fax: [phone]  

## 2016-04-03 ENCOUNTER — Other Ambulatory Visit: Payer: Self-pay | Admitting: *Deleted

## 2016-04-03 MED ORDER — OXYCODONE-ACETAMINOPHEN 5-325 MG PO TABS: ORAL_TABLET | ORAL | 0 refills | Status: DC

## 2016-04-03 NOTE — Telephone Encounter (Signed)
[facility] [phone] Fax: [phone]  

## 2016-04-04 ENCOUNTER — Non-Acute Institutional Stay (SKILLED_NURSING_FACILITY): Payer: Medicare Other | Admitting: Internal Medicine

## 2016-04-04 ENCOUNTER — Encounter: Payer: Self-pay | Admitting: Internal Medicine

## 2016-04-04 DIAGNOSIS — F411 Generalized anxiety disorder: Secondary | ICD-10-CM | POA: Diagnosis not present

## 2016-04-04 DIAGNOSIS — R600 Localized edema: Secondary | ICD-10-CM | POA: Diagnosis not present

## 2016-04-04 DIAGNOSIS — J449 Chronic obstructive pulmonary disease, unspecified: Secondary | ICD-10-CM | POA: Diagnosis not present

## 2016-04-04 NOTE — Progress Notes (Signed)
Patient ID: [name] [name], female   DOB: 04/20/1917, 82 y.o.   MRN: [id number]     LOCATION: Facility: [facility]   [name], [name] Sawtell, MD  Allergies  Allergen Reactions  . Prednisone Other (See Comments)    Delirium   . Sertraline Hcl Other (See Comments)    Delirium      CODE STATUS: DNR   GOALS OF CARE: Advanced Directives 01/10/2016  Does patient have an advance directive? Yes  Type of Advance Directive Out of facility DNR (pink MOST or yellow form)  Does patient want to make changes to advanced directive? No - Patient declined  Copy of advanced directive(s) in chart? Yes  Pre-existing out of facility DNR order (yellow form or pink MOST form) -     Chief Complaint  Patient presents with  . Medical Management of Chronic Issues    Routine Visit    HPI 82 y.o. female seen today for routine visit. She denies any concern this visit.  Her breathing has been stable with oxygen.    REVIEW OF SYSTEM Constitutional: Negative for fever HENT: Negative for congestion   Eyes: Negative for blurred vision Respiratory: On chronic o2 and has chronic cough.  Cardiovascular: Negative for chest pain, palpitations. Has chronic leg swelling.  Gastrointestinal: Negative for heartburn, nausea, vomiting, abdominal pain.  Genitourinary: Negative for dysuria Musculoskeletal: has chronic back pain and has arthritis changes to her fingers Skin: Negative for itching and rash.  Neurological: Negative for dizziness   Past Medical History:  Diagnosis Date  . Arthritis   . Atrial fibrillation (HCC)   . Congestive heart failure, unspecified   . Depressive type psychosis (HCC)   . Diverticulitis of colon (without mention of hemorrhage)(562.11)   . Duodenal ulcer, unspecified as acute or chronic, without hemorrhage, perforation, or obstruction   . Dysphagia, unspecified(787.20)   . Edema   . Gallstones   . Other B-complex deficiencies   . Other constipation     . PONV (postoperative nausea and vomiting)   . Routine general medical examination at a health care facility   . Sciatica   . Stricture and stenosis of esophagus   . Unspecified essential hypertension   . Unspecified hypothyroidism         Medication List       Accurate as of 04/04/16  3:04 PM. Always use your most recent med list.          acetaminophen 325 MG tablet Commonly known as:  TYLENOL Take 650 mg by mouth 3 (three) times daily.   ALPRAZolam 0.25 MG tablet Commonly known as:  XANAX Take one tablet by mouth three times daily for anxiety   budesonide-formoterol 80-4.5 MCG/ACT inhaler Commonly known as:  SYMBICORT Inhale 2 puffs into the lungs 2 (two) times daily. Reported on 12/07/2015   cloNIDine 0.1 MG tablet Commonly known as:  CATAPRES Take 1 tablet (0.1 mg total) by mouth daily.   dextromethorphan-guaiFENesin 10-100 MG/5ML liquid Commonly known as:  ROBITUSSIN-DM 2 teaspoons by mouth every 6 hours as needed for cough   diltiazem 120 MG 24 hr capsule Commonly known as:  CARDIZEM CD Take 1 capsule (120 mg total) by mouth every morning.   docusate sodium 100 MG capsule Commonly known as:  COLACE Take 100 mg by mouth at bedtime.   gabapentin 100 MG capsule Commonly known as:  NEURONTIN Take 100 mg by mouth at bedtime.   ipratropium-albuterol 0.5-2.5 (3) MG/3ML Soln Commonly known as:  DUONEB  Take 3 mLs by nebulization 2 (two) times daily as needed.   iron polysaccharides 150 MG capsule Commonly known as:  NIFEREX Take 150 mg by mouth 2 (two) times daily.   levothyroxine 25 MCG tablet Commonly known as:  SYNTHROID, LEVOTHROID Take 1 tablet (25 mcg total) by mouth every morning.   mirtazapine 30 MG tablet Commonly known as:  REMERON Take 30 mg by mouth at bedtime.   OCUSOFT LID SCRUB Pads Apply 2 each topically 2 (two) times daily.   omeprazole 10 MG capsule Commonly known as:  PRILOSEC Take 10 mg by mouth daily.   oxyCODONE-acetaminophen  5-325 MG tablet Commonly known as:  PERCOCET/ROXICET Take 1 tablet by mouth daily.   OXYGEN Inhale 3 L into the lungs.   polyvinyl alcohol 1.4 % ophthalmic solution Commonly known as:  LIQUIFILM TEARS Place 1 drop into both eyes 3 (three) times daily as needed.   potassium chloride SA 20 MEQ tablet Commonly known as:  K-DUR,KLOR-CON Take 20 mEq by mouth daily.   QUEtiapine 25 MG tablet Commonly known as:  SEROQUEL Take 25 mg by mouth at bedtime. Take 37.5 mg by mouth daily   torsemide 20 MG tablet Commonly known as:  DEMADEX Take 40 mg by mouth daily. Reported on 09/07/2015   trimethoprim 100 MG tablet Commonly known as:  TRIMPEX Take 100 mg by mouth daily.   UNABLE TO FIND Med Name: Med Pass 120 mL by mouth 2 times daily   vitamin B-12 1000 MCG tablet Commonly known as:  CYANOCOBALAMIN Take 1,000 mcg by mouth daily.   Vitamin D-3 1000 units Caps Take 1 capsule by mouth daily.        PHYSICAL EXAM Vitals:   04/04/16 1456  BP: (!) 170/59  Pulse: 83  Resp: 18  Temp: 97.1 F (36.2 C)  TempSrc: Oral  SpO2: 99%  Weight: 108 lb (49 kg)  Height: 5\' 6"  (1.676 m)    Body mass index is 17.43 kg/m.  Wt Readings from Last 3 Encounters:  04/04/16 108 lb (49 kg)  03/13/16 108 lb (49 kg)  02/07/16 108 lb (49 kg)   General- elderly thin built and frail female in no acute distress Head- atraumatic, normocephalic Eyes- EOMI, no pallor, no icterus, no discharge Neck- no lymphadenopathy Nose- no nasal discharge Mouth- normal mucus membrane Cardiovascular- normal s1,s2, systolic murmur + Respiratory- poor air movement, no wheeze, no rhonchi, no crackles, on o2 by nasal canula Abdomen- bowel sounds present, soft, non tender Musculoskeletal- able to move all 4 extremities, chronic skin changes. Self propels in wheelchair, kyphosis present Neurological: alert and conversational and can make her needs known    Labs reviewed CBC CBC Latest Ref Rng & Units 02/27/2016  11/23/2015 09/01/2015  WBC 10:3/mL 6.0 4.8 4.5  Hemoglobin 12.0 - 16.0 g/dL 11.1(A) 9.8(A) 10.3(A)  Hematocrit 36 - 46 % 35(A) 31(A) 33(A)  Platelets 150 - 399 K/L 212 232 217   CMP Latest Ref Rng & Units 02/27/2016 11/23/2015 08/04/2015  Glucose 65 - 99 mg/dL - - -  BUN 4 - 21 mg/dL 16(X) 09(U) 16  Creatinine 0.5 - 1.1 mg/dL 0.4(V) 4.0(J) 1.0  Sodium 137 - 147 mmol/L 143 143 142  Potassium 3.4 - 5.3 mmol/L 4.3 4.8 4.4  Chloride 98 - 107 mmol/L - - -  CO2 21 - 32 mmol/L - - -  Calcium 8.5 - 10.1 mg/dL - - -  Total Protein 6.0 - 8.3 g/dL - - -  Total Bilirubin 0.3 -  1.2 mg/dL - - -  Alkaline Phos 25 - 125 U/L - 76 -  AST 13 - 35 U/L - 19 -  ALT 7 - 35 U/L - 12 -   Lab Results  Component Value Date   HGBA1C 6.2 02/27/2016   Lipid Panel     Component Value Date/Time   CHOL 161 02/27/2016   TRIG 111 02/27/2016   HDL 46 02/27/2016   LDLCALC 93 02/27/2016   Lab Results  Component Value Date   TSH 3.90 03/14/2016    Assessment/Plan  GAD Continue xanax 0.25 mg tid and monitor  Leg edema Stable, continue torsemide 40 mg daily and monitor  Copd Continue o2 and her bronchodilator, no recent exacerbation, monitor   Oneal GroutMAHIMA Bernal Luhman, MD Internal Medicine [facility] [address] [place], [place] [address] Cell Phone (Monday-Friday 8 am - 5 pm): [phone] On Call: [phone] and follow prompts after 5 pm and on weekends Office Phone: [phone] Office Fax: [phone]

## 2016-05-09 ENCOUNTER — Encounter: Payer: Self-pay | Admitting: Internal Medicine

## 2016-05-09 NOTE — Progress Notes (Signed)
Patient ID: [name] [name], female   DOB: 01/20/1917, 82 y.o.   MRN: [id number]     LOCATION: Facility: [facility]   [name], MD  Allergies  Allergen Reactions  . Prednisone Other (See Comments)    Delirium   . Sertraline Hcl Other (See Comments)    Delirium      CODE STATUS: DNR   GOALS OF CARE: Advanced Directives 01/10/2016  Does patient have an advance directive? Yes  Type of Advance Directive Out of facility DNR (pink MOST or yellow form)  Does patient want to make changes to advanced directive? No - Patient declined  Copy of advanced directive(s) in chart? Yes  Pre-existing out of facility DNR order (yellow form or pink MOST form) -     Chief Complaint  Patient presents with  . Medical Management of Chronic Issues    Routine Visit    HPI 82 y.o. female seen today for routine visit. She denies any concern this visit.  Her breathing has been stable with oxygen.    REVIEW OF SYSTEM Constitutional: Negative for fever HENT: Negative for congestion   Eyes: Negative for blurred vision Respiratory: On chronic o2 and has chronic cough.  Cardiovascular: Negative for chest pain, palpitations. Has chronic leg swelling.  Gastrointestinal: Negative for heartburn, nausea, vomiting, abdominal pain.  Genitourinary: Negative for dysuria Musculoskeletal: has chronic back pain and has arthritis changes to her fingers Skin: Negative for itching and rash.  Neurological: Negative for dizziness   Past Medical History:  Diagnosis Date  . Arthritis   . Atrial fibrillation (HCC)   . Congestive heart failure, unspecified   . Depressive type psychosis (HCC)   . Diverticulitis of colon (without mention of hemorrhage)(562.11)   . Duodenal ulcer, unspecified as acute or chronic, without hemorrhage, perforation, or obstruction   . Dysphagia, unspecified(787.20)   . Edema   . Gallstones   . Other B-complex deficiencies   . Other constipation     . PONV (postoperative nausea and vomiting)   . Routine general medical examination at a health care facility   . Sciatica   . Stricture and stenosis of esophagus   . Unspecified essential hypertension   . Unspecified hypothyroidism         Medication List       Accurate as of 05/09/16  3:27 PM. Always use your most recent med list.          acetaminophen 325 MG tablet Commonly known as:  TYLENOL Take 650 mg by mouth 3 (three) times daily.   ALPRAZolam 0.25 MG tablet Commonly known as:  XANAX Take one tablet by mouth three times daily for anxiety   budesonide-formoterol 80-4.5 MCG/ACT inhaler Commonly known as:  SYMBICORT Inhale 2 puffs into the lungs 2 (two) times daily. Reported on 12/07/2015   cloNIDine 0.1 MG tablet Commonly known as:  CATAPRES Take 1 tablet (0.1 mg total) by mouth daily.   dextromethorphan-guaiFENesin 10-100 MG/5ML liquid Commonly known as:  ROBITUSSIN-DM 2 teaspoons by mouth every 6 hours as needed for cough   diltiazem 120 MG 24 hr capsule Commonly known as:  CARDIZEM CD Take 1 capsule (120 mg total) by mouth every morning.   docusate sodium 100 MG capsule Commonly known as:  COLACE Take 100 mg by mouth at bedtime.   gabapentin 100 MG capsule Commonly known as:  NEURONTIN Take 100 mg by mouth at bedtime.   ipratropium-albuterol 0.5-2.5 (3) MG/3ML Soln Commonly known as:  DUONEB  Take 3 mLs by nebulization 2 (two) times daily as needed.   iron polysaccharides 150 MG capsule Commonly known as:  NIFEREX Take 150 mg by mouth 2 (two) times daily.   levothyroxine 25 MCG tablet Commonly known as:  SYNTHROID, LEVOTHROID Take 1 tablet (25 mcg total) by mouth every morning.   mirtazapine 30 MG tablet Commonly known as:  REMERON Take 30 mg by mouth at bedtime.   OCUSOFT LID SCRUB Pads Apply 2 each topically 2 (two) times daily.   omeprazole 10 MG capsule Commonly known as:  PRILOSEC Take 10 mg by mouth daily.   oxyCODONE-acetaminophen  5-325 MG tablet Commonly known as:  PERCOCET/ROXICET Take 1 tablet by mouth daily.   OXYGEN Inhale 3 L into the lungs.   polyvinyl alcohol 1.4 % ophthalmic solution Commonly known as:  LIQUIFILM TEARS Place 1 drop into both eyes 2 (two) times daily.   potassium chloride SA 20 MEQ tablet Commonly known as:  K-DUR,KLOR-CON Take 20 mEq by mouth daily.   QUEtiapine 25 MG tablet Commonly known as:  SEROQUEL Take 25 mg by mouth at bedtime. Take 37.5 mg by mouth daily   torsemide 20 MG tablet Commonly known as:  DEMADEX Take 40 mg by mouth daily. Reported on 09/07/2015   trimethoprim 100 MG tablet Commonly known as:  TRIMPEX Take 100 mg by mouth daily.   UNABLE TO FIND Med Name: Med Pass 120 mL by mouth 2 times daily   vitamin B-12 1000 MCG tablet Commonly known as:  CYANOCOBALAMIN Take 1,000 mcg by mouth daily.   Vitamin D-3 1000 units Caps Take 1 capsule by mouth daily.        PHYSICAL EXAM Vitals:   05/09/16 1515  BP: (!) 132/59  Pulse: 83  Resp: 18  Temp: (!) 96.6 F (35.9 C)  TempSrc: Oral  Weight: 104 lb 3.2 oz (47.3 kg)  Height: 5\' 6"  (1.676 m)    Body mass index is 16.82 kg/m.  Wt Readings from Last 3 Encounters:  05/09/16 104 lb 3.2 oz (47.3 kg)  04/04/16 108 lb (49 kg)  03/13/16 108 lb (49 kg)   General- elderly thin built and frail female in no acute distress Head- atraumatic, normocephalic Eyes- EOMI, no pallor, no icterus, no discharge Neck- no lymphadenopathy Nose- no nasal discharge Mouth- normal mucus membrane Cardiovascular- normal s1,s2, systolic murmur + Respiratory- poor air movement, no wheeze, no rhonchi, no crackles, on o2 by nasal canula Abdomen- bowel sounds present, soft, non tender Musculoskeletal- able to move all 4 extremities, chronic skin changes. Self propels in wheelchair, kyphosis present Neurological: alert and conversational and can make her needs known    Labs reviewed CBC CBC Latest Ref Rng & Units 02/27/2016  11/23/2015 09/01/2015  WBC 10:3/mL 6.0 4.8 4.5  Hemoglobin 12.0 - 16.0 g/dL 11.1(A) 9.8(A) 10.3(A)  Hematocrit 36 - 46 % 35(A) 31(A) 33(A)  Platelets 150 - 399 K/L 212 232 217   CMP Latest Ref Rng & Units 02/27/2016 11/23/2015 08/04/2015  Glucose 65 - 99 mg/dL - - -  BUN 4 - 21 mg/dL 24(M37(A) 01(U40(A) 16  Creatinine 0.5 - 1.1 mg/dL 2.7(O1.4(A) 5.3(G1.6(A) 1.0  Sodium 137 - 147 mmol/L 143 143 142  Potassium 3.4 - 5.3 mmol/L 4.3 4.8 4.4  Chloride 98 - 107 mmol/L - - -  CO2 21 - 32 mmol/L - - -  Calcium 8.5 - 10.1 mg/dL - - -  Total Protein 6.0 - 8.3 g/dL - - -  Total Bilirubin 0.3 -  1.2 mg/dL - - -  Alkaline Phos 25 - 125 U/L - 76 -  AST 13 - 35 U/L - 19 -  ALT 7 - 35 U/L - 12 -   Lab Results  Component Value Date   HGBA1C 6.2 02/27/2016   Lipid Panel     Component Value Date/Time   CHOL 161 02/27/2016   TRIG 111 02/27/2016   HDL 46 02/27/2016   LDLCALC 93 02/27/2016   Lab Results  Component Value Date   TSH 3.90 03/14/2016    Assessment/Plan  GAD Continue xanax 0.25 mg tid and monitor  Leg edema Stable, continue torsemide 40 mg daily and monitor  Copd Continue o2 and her bronchodilator, no recent exacerbation, monitor   Oneal GroutMAHIMA PANDEY, MD Internal Medicine [facility] [address] [place], [place] [address] Cell Phone (Monday-Friday 8 am - 5 pm): [phone] On Call: [phone] and follow prompts after 5 pm and on weekends Office Phone: [phone] Office Fax: [phone]

## 2016-05-17 ENCOUNTER — Other Ambulatory Visit: Payer: Self-pay

## 2016-05-17 MED ORDER — ALPRAZOLAM 0.25 MG PO TABS: ORAL_TABLET | ORAL | 5 refills | Status: AC

## 2016-05-17 NOTE — Telephone Encounter (Signed)
Rx faxed to [facility] @ [phone], phone number [phone]  

## 2016-05-27 ENCOUNTER — Non-Acute Institutional Stay (SKILLED_NURSING_FACILITY): Payer: Medicare Other | Admitting: Internal Medicine

## 2016-05-27 ENCOUNTER — Encounter: Payer: Self-pay | Admitting: Internal Medicine

## 2016-05-27 DIAGNOSIS — G629 Polyneuropathy, unspecified: Secondary | ICD-10-CM | POA: Diagnosis not present

## 2016-05-27 DIAGNOSIS — F331 Major depressive disorder, recurrent, moderate: Secondary | ICD-10-CM

## 2016-05-27 DIAGNOSIS — G608 Other hereditary and idiopathic neuropathies: Secondary | ICD-10-CM

## 2016-05-27 DIAGNOSIS — D638 Anemia in other chronic diseases classified elsewhere: Secondary | ICD-10-CM

## 2016-05-27 NOTE — Patient Instructions (Signed)
Opened in error

## 2016-05-27 NOTE — Progress Notes (Signed)
Patient ID: [name] [name], female   DOB: 06/18/1917, 82 y.o.   MRN: [id number]     LOCATION: Facility: [facility]   [name] [name], [name] Depaz, MD  Allergies  Allergen Reactions  . Prednisone Other (See Comments)    Delirium   . Sertraline Hcl Other (See Comments)    Delirium      CODE STATUS: DNR   GOALS OF CARE: Advanced Directives 01/10/2016  Does Patient Have a Medical Advance Directive? Yes  Type of Advance Directive Out of facility DNR (pink MOST or yellow form)  Does patient want to make changes to medical advance directive? No - Patient declined  Copy of Healthcare Power of Attorney in Chart? Yes  Pre-existing out of facility DNR order (yellow form or pink MOST form) -     Chief Complaint  Patient presents with  . Medical Management of Chronic Issues    Routine Visit    HPI 82 y.o. female seen today for routine visit. She denies any concern this visit.  Her breathing has been stable with oxygen.  BP reading on review is overall stable.    REVIEW OF SYSTEM HENT: Negative for congestion, headache   Eyes: Negative for blurred vision Respiratory: On chronic o2 and has chronic cough.  Cardiovascular: Negative for chest pain, palpitations. Has chronic leg swelling.  Gastrointestinal: Negative for heartburn, nausea, vomiting, abdominal pain.  Genitourinary: Negative for dysuria Musculoskeletal: has chronic back pain and has arthritis changes to her fingers Skin: Negative for itching and rash.  Neurological: Negative for dizziness   Past Medical History:  Diagnosis Date  . Arthritis   . Atrial fibrillation (HCC)   . Congestive heart failure, unspecified   . Depressive type psychosis (HCC)   . Diverticulitis of colon (without mention of hemorrhage)(562.11)   . Duodenal ulcer, unspecified as acute or chronic, without hemorrhage, perforation, or obstruction   . Dysphagia, unspecified(787.20)   . Edema   . Gallstones   . Other B-complex  deficiencies   . Other constipation   . PONV (postoperative nausea and vomiting)   . Routine general medical examination at a health care facility   . Sciatica   . Stricture and stenosis of esophagus   . Unspecified essential hypertension   . Unspecified hypothyroidism         Medication List       Accurate as of 05/27/16  2:48 PM. Always use your most recent med list.          acetaminophen 325 MG tablet Commonly known as:  TYLENOL Take 650 mg by mouth 3 (three) times daily.   ALPRAZolam 0.25 MG tablet Commonly known as:  XANAX Take one tablet by mouth three times daily for anxiety   budesonide-formoterol 80-4.5 MCG/ACT inhaler Commonly known as:  SYMBICORT Inhale 2 puffs into the lungs 2 (two) times daily. Reported on 12/07/2015   cloNIDine 0.1 MG tablet Commonly known as:  CATAPRES Take 1 tablet (0.1 mg total) by mouth daily.   dextromethorphan-guaiFENesin 10-100 MG/5ML liquid Commonly known as:  ROBITUSSIN-DM 2 teaspoons by mouth every 6 hours as needed for cough   diltiazem 120 MG 24 hr capsule Commonly known as:  CARDIZEM CD Take 1 capsule (120 mg total) by mouth every morning.   docusate sodium 100 MG capsule Commonly known as:  COLACE Take 100 mg by mouth at bedtime.   gabapentin 100 MG capsule Commonly known as:  NEURONTIN Take 100 mg by mouth at bedtime.   ipratropium-albuterol 0.5-2.5 (  3) MG/3ML Soln Commonly known as:  DUONEB Take 3 mLs by nebulization 2 (two) times daily as needed.   iron polysaccharides 150 MG capsule Commonly known as:  NIFEREX Take 150 mg by mouth 2 (two) times daily.   levothyroxine 25 MCG tablet Commonly known as:  SYNTHROID, LEVOTHROID Take 1 tablet (25 mcg total) by mouth every morning.   mirtazapine 30 MG tablet Commonly known as:  REMERON Take 30 mg by mouth at bedtime.   OCUSOFT LID SCRUB Pads Apply 2 each topically 2 (two) times daily.   omeprazole 10 MG capsule Commonly known as:  PRILOSEC Take 10 mg by  mouth daily.   oxyCODONE-acetaminophen 5-325 MG tablet Commonly known as:  PERCOCET/ROXICET Take 1 tablet by mouth daily.   OXYGEN Inhale 3 L into the lungs.   polyvinyl alcohol 1.4 % ophthalmic solution Commonly known as:  LIQUIFILM TEARS Place 1 drop into both eyes 2 (two) times daily.   potassium chloride SA 20 MEQ tablet Commonly known as:  K-DUR,KLOR-CON Take 20 mEq by mouth daily.   QUEtiapine 25 MG tablet Commonly known as:  SEROQUEL Take 25 mg by mouth at bedtime. Take 37.5 mg by mouth daily   torsemide 20 MG tablet Commonly known as:  DEMADEX Take 40 mg by mouth daily. Reported on 09/07/2015   trimethoprim 100 MG tablet Commonly known as:  TRIMPEX Take 100 mg by mouth daily.   UNABLE TO FIND Med Name: Med Pass 120 mL by mouth 2 times daily   vitamin B-12 1000 MCG tablet Commonly known as:  CYANOCOBALAMIN Take 1,000 mcg by mouth daily.   Vitamin D-3 1000 units Caps Take 1 capsule by mouth daily.        PHYSICAL EXAM Vitals:   05/27/16 1439  BP: 130/78  Pulse: 84  Resp: 20  Temp: 97.6 F (36.4 C)  TempSrc: Oral  SpO2: 96%  Weight: 108 lb (49 kg)  Height: 5\' 6"  (1.676 m)    Body mass index is 17.43 kg/m.  Wt Readings from Last 3 Encounters:  05/27/16 108 lb (49 kg)  05/09/16 104 lb 3.2 oz (47.3 kg)  04/04/16 108 lb (49 kg)   General- elderly thin built and frail female in no acute distress Head- atraumatic, normocephalic Eyes- EOMI, no pallor, no icterus, no discharge Neck- no lymphadenopathy Nose- no nasal discharge Mouth- normal mucus membrane Cardiovascular- normal s1,s2, systolic murmur + Respiratory- poor air movement, no wheeze, no rhonchi, no crackles, on o2 by nasal canula Abdomen- bowel sounds present, soft, non tender Musculoskeletal- able to move all 4 extremities, chronic skin changes. Self propels in wheelchair, kyphosis present Neurological: alert and conversational and can make her needs known    Labs reviewed CBC CBC  Latest Ref Rng & Units 02/27/2016 11/23/2015 09/01/2015  WBC 10:3/mL 6.0 4.8 4.5  Hemoglobin 12.0 - 16.0 g/dL 11.1(A) 9.8(A) 10.3(A)  Hematocrit 36 - 46 % 35(A) 31(A) 33(A)  Platelets 150 - 399 K/L 212 232 217   CMP Latest Ref Rng & Units 02/27/2016 11/23/2015 08/04/2015  Glucose 65 - 99 mg/dL - - -  BUN 4 - 21 mg/dL 16(X) 09(U) 16  Creatinine 0.5 - 1.1 mg/dL 0.4(V) 4.0(J) 1.0  Sodium 137 - 147 mmol/L 143 143 142  Potassium 3.4 - 5.3 mmol/L 4.3 4.8 4.4  Chloride 98 - 107 mmol/L - - -  CO2 21 - 32 mmol/L - - -  Calcium 8.5 - 10.1 mg/dL - - -  Total Protein 6.0 - 8.3 g/dL - - -  Total Bilirubin 0.3 - 1.2 mg/dL - - -  Alkaline Phos 25 - 125 U/L - 76 -  AST 13 - 35 U/L - 19 -  ALT 7 - 35 U/L - 12 -   Lab Results  Component Value Date   HGBA1C 6.2 02/27/2016   Lipid Panel     Component Value Date/Time   CHOL 161 02/27/2016   TRIG 111 02/27/2016   HDL 46 02/27/2016   LDLCALC 93 02/27/2016   Lab Results  Component Value Date   TSH 3.90 03/14/2016    Assessment/Plan  Neuropathic pain Stable, currently on gabapentin 100 mg qhs, monitor  Chronic recurrent depression Followed by psych service. Continue seroquel and remeron, monitor mood  Anemia of chronic disease Monitor h&h periodically and continue iron supplement   [name] GroutMAHIMA Marionette Meskill, MD Internal Medicine [facility] [address] [place], [place] [address] Cell Phone (Monday-Friday 8 am - 5 pm): [phone] On Call: [phone] and follow prompts after 5 pm and on weekends Office Phone: [phone] Office Fax: [phone]

## 2016-07-04 ENCOUNTER — Non-Acute Institutional Stay (SKILLED_NURSING_FACILITY): Payer: Medicare Other | Admitting: Internal Medicine

## 2016-07-04 ENCOUNTER — Encounter: Payer: Self-pay | Admitting: Internal Medicine

## 2016-07-04 DIAGNOSIS — N3090 Cystitis, unspecified without hematuria: Secondary | ICD-10-CM

## 2016-07-04 DIAGNOSIS — E038 Other specified hypothyroidism: Secondary | ICD-10-CM

## 2016-07-04 DIAGNOSIS — J449 Chronic obstructive pulmonary disease, unspecified: Secondary | ICD-10-CM

## 2016-07-04 NOTE — Progress Notes (Signed)
Patient ID: [name] [name], female   DOB: [birth year], 83 y.o.   MRN: [id number]     LOCATION: Facility: [facility]   [name], MD  Allergies  Allergen Reactions  . Prednisone Other (See Comments)    Delirium   . Sertraline Hcl Other (See Comments)    Delirium      CODE STATUS: DNR   GOALS OF CARE: Advanced Directives 01/10/2016  Does Patient Have a Medical Advance Directive? Yes  Type of Advance Directive Out of facility DNR (pink MOST or yellow form)  Does patient want to make changes to medical advance directive? No - Patient declined  Copy of Healthcare Power of Attorney in Chart? Yes  Pre-existing out of facility DNR order (yellow form or pink MOST form) -     Chief Complaint  Patient presents with  . Medical Management of Chronic Issues    Routine Visit     HPI 83 y.o. female seen today for routine visit. She denies any concern this visit. No concern from nursing staff.  REVIEW OF SYSTEM HENT: Negative for headache, fever   Eyes: Negative for blurred vision Respiratory: On chronic o2 and has chronic cough from COPD.  Cardiovascular: Negative for chest pain, palpitations. Has chronic leg swelling from venous stasis.  Gastrointestinal: Negative for heartburn, nausea, vomiting, abdominal pain.  Genitourinary: Negative for dysuria Musculoskeletal: has chronic back pain and has arthritis changes to her fingers Skin: Negative for itching and rash.  Neurological: Negative for dizziness   Past Medical History:  Diagnosis Date  . Arthritis   . Atrial fibrillation (HCC)   . Congestive heart failure, unspecified   . Depressive type psychosis (HCC)   . Diverticulitis of colon (without mention of hemorrhage)(562.11)   . Duodenal ulcer, unspecified as acute or chronic, without hemorrhage, perforation, or obstruction   . Dysphagia, unspecified(787.20)   . Edema   . Gallstones   . Other B-complex deficiencies   . Other  constipation   . PONV (postoperative nausea and vomiting)   . Routine general medical examination at a health care facility   . Sciatica   . Stricture and stenosis of esophagus   . Unspecified essential hypertension   . Unspecified hypothyroidism       Allergies as of 07/04/2016      Reactions   Prednisone Other (See Comments)   Delirium    Sertraline Hcl Other (See Comments)   Delirium       Medication List       Accurate as of 07/04/16  3:01 PM. Always use your most recent med list.          acetaminophen 325 MG tablet Commonly known as:  TYLENOL Take 650 mg by mouth 3 (three) times daily.   ALPRAZolam 0.25 MG tablet Commonly known as:  XANAX Take one tablet by mouth three times daily for anxiety   budesonide-formoterol 80-4.5 MCG/ACT inhaler Commonly known as:  SYMBICORT Inhale 2 puffs into the lungs 2 (two) times daily. Reported on 12/07/2015   cloNIDine 0.1 MG tablet Commonly known as:  CATAPRES Take 1 tablet (0.1 mg total) by mouth daily.   dextromethorphan-guaiFENesin 10-100 MG/5ML liquid Commonly known as:  ROBITUSSIN-DM 2 teaspoons by mouth every 6 hours as needed for cough   diltiazem 120 MG 24 hr capsule Commonly known as:  CARDIZEM CD Take 1 capsule (120 mg total) by mouth every morning.   docusate sodium 100 MG capsule Commonly known as:  COLACE Take 100  mg by mouth at bedtime.   gabapentin 100 MG capsule Commonly known as:  NEURONTIN Take 100 mg by mouth at bedtime.   ipratropium-albuterol 0.5-2.5 (3) MG/3ML Soln Commonly known as:  DUONEB Take 3 mLs by nebulization 2 (two) times daily as needed.   iron polysaccharides 150 MG capsule Commonly known as:  NIFEREX Take 150 mg by mouth 2 (two) times daily.   levothyroxine 25 MCG tablet Commonly known as:  SYNTHROID, LEVOTHROID Take 1 tablet (25 mcg total) by mouth every morning.   mirtazapine 30 MG tablet Commonly known as:  REMERON Take 30 mg by mouth at bedtime.   OCUSOFT LID SCRUB  Pads Apply 2 each topically 2 (two) times daily.   omeprazole 10 MG capsule Commonly known as:  PRILOSEC Take 10 mg by mouth daily.   oxyCODONE-acetaminophen 5-325 MG tablet Commonly known as:  PERCOCET/ROXICET Take 1 tablet by mouth daily.   OXYGEN Inhale 3 L into the lungs.   polyvinyl alcohol 1.4 % ophthalmic solution Commonly known as:  LIQUIFILM TEARS Place 1 drop into both eyes 2 (two) times daily. 1 drop in both eyes 3 times daily as needed for dry eyes   potassium chloride SA 20 MEQ tablet Commonly known as:  K-DUR,KLOR-CON Take 20 mEq by mouth daily.   QUEtiapine 25 MG tablet Commonly known as:  SEROQUEL Take 25 mg by mouth at bedtime. Take 37.5 mg by mouth daily   torsemide 20 MG tablet Commonly known as:  DEMADEX Take 40 mg by mouth daily. Reported on 09/07/2015   trimethoprim 100 MG tablet Commonly known as:  TRIMPEX Take 100 mg by mouth daily.   UNABLE TO FIND Med Name: Med Pass 120 mL by mouth 2 times daily   vitamin B-12 1000 MCG tablet Commonly known as:  CYANOCOBALAMIN Take 1,000 mcg by mouth daily.   Vitamin D-3 1000 units Caps Take 1 capsule by mouth daily.        PHYSICAL EXAM Vitals:   07/04/16 1455  BP: (!) 145/65  Pulse: 97  Resp: 19  Temp: 97.6 F (36.4 C)  TempSrc: Oral  SpO2: 96%  Weight: 105 lb 3.2 oz (47.7 kg)  Height: 5\' 6"  (1.676 m)    Body mass index is 16.98 kg/m.  Wt Readings from Last 3 Encounters:  07/04/16 105 lb 3.2 oz (47.7 kg)  05/27/16 108 lb (49 kg)  05/09/16 104 lb 3.2 oz (47.3 kg)   General- elderly thin built and frail female in no acute distress Head- atraumatic, normocephalic Neck- no lymphadenopathy Nose- no nasal discharge Mouth- normal mucus membrane Cardiovascular- normal s1,s2, systolic murmur + Respiratory- poor air movement, no wheeze, no rhonchi, no crackles, on o2 by nasal canula Abdomen- bowel sounds present, soft, non tender Musculoskeletal- able to move all 4 extremities, chronic skin  changes. Self propels in wheelchair, kyphosis present Neurological: alert and conversational and can make her needs known    Labs reviewed CBC CBC Latest Ref Rng & Units 02/27/2016 11/23/2015 09/01/2015  WBC 10:3/mL 6.0 4.8 4.5  Hemoglobin 12.0 - 16.0 g/dL 11.1(A) 9.8(A) 10.3(A)  Hematocrit 36 - 46 % 35(A) 31(A) 33(A)  Platelets 150 - 399 K/L 212 232 217   CMP Latest Ref Rng & Units 02/27/2016 11/23/2015 08/04/2015  Glucose 65 - 99 mg/dL - - -  BUN 4 - 21 mg/dL 91(Y37(A) 78(G40(A) 16  Creatinine 0.5 - 1.1 mg/dL 9.5(A1.4(A) 2.1(H1.6(A) 1.0  Sodium 137 - 147 mmol/L 143 143 142  Potassium 3.4 - 5.3 mmol/L  4.3 4.8 4.4  Chloride 98 - 107 mmol/L - - -  CO2 21 - 32 mmol/L - - -  Calcium 8.5 - 10.1 mg/dL - - -  Total Protein 6.0 - 8.3 g/dL - - -  Total Bilirubin 0.3 - 1.2 mg/dL - - -  Alkaline Phos 25 - 125 U/L - 76 -  AST 13 - 35 U/L - 19 -  ALT 7 - 35 U/L - 12 -   Lab Results  Component Value Date   HGBA1C 6.2 02/27/2016   Lipid Panel     Component Value Date/Time   CHOL 161 02/27/2016   TRIG 111 02/27/2016   HDL 46 02/27/2016   LDLCALC 93 02/27/2016   Lab Results  Component Value Date   TSH 3.90 03/14/2016    Assessment/Plan  COPD Breathing has been stable. Continue oxygen and her bronchodilator treatment. No signs of COPD exacerbation at present.  Hypothyroidism Continue levothyroxine current regimen, no changes made  Recurrent UTI Continue trimethoprim prophylaxis and monitor    [name], MD Internal Medicine [facility] [address] [place], [place] [address] Cell Phone (Monday-Friday 8 am - 5 pm): [phone] On Call: [phone] and follow prompts after 5 pm and on weekends Office Phone: [phone] Office Fax: [phone]

## 2016-07-18 LAB — BASIC METABOLIC PANEL
BUN: 39 mg/dL — AB (ref 4–21)
Creatinine: 1.6 mg/dL — AB (ref 0.5–1.1)
GLUCOSE: 136 mg/dL
Potassium: 4.8 mmol/L (ref 3.4–5.3)
SODIUM: 143 mmol/L (ref 137–147)

## 2016-07-18 LAB — CBC AND DIFFERENTIAL
HEMATOCRIT: 37 % (ref 36–46)
HEMOGLOBIN: 11.8 g/dL — AB (ref 12.0–16.0)
PLATELETS: 270 10*3/uL (ref 150–399)
WBC: 7.2 10*3/mL

## 2016-08-09 LAB — HEMOGLOBIN A1C: Hemoglobin A1C: 6

## 2016-08-09 LAB — MICROALBUMIN, URINE: Microalb, Ur: 11.4

## 2016-08-09 LAB — TSH: TSH: 3.86 u[IU]/mL (ref 0.41–5.90)

## 2016-10-03 ENCOUNTER — Non-Acute Institutional Stay (SKILLED_NURSING_FACILITY): Payer: Medicare Other | Admitting: Internal Medicine

## 2016-10-03 ENCOUNTER — Encounter: Payer: Self-pay | Admitting: Internal Medicine

## 2016-10-03 DIAGNOSIS — I482 Chronic atrial fibrillation, unspecified: Secondary | ICD-10-CM

## 2016-10-03 DIAGNOSIS — M792 Neuralgia and neuritis, unspecified: Secondary | ICD-10-CM

## 2016-10-03 DIAGNOSIS — I5032 Chronic diastolic (congestive) heart failure: Secondary | ICD-10-CM | POA: Diagnosis not present

## 2016-10-03 DIAGNOSIS — F0391 Unspecified dementia with behavioral disturbance: Secondary | ICD-10-CM | POA: Diagnosis not present

## 2016-10-03 NOTE — Progress Notes (Signed)
Patient ID: [name] [name], female   DOB: 1917/02/05, 83 y.o.   MRN: [id number]     LOCATION: Facility: [facility]   [name] Grout, MD  Allergies  Allergen Reactions  . Prednisone Other (See Comments)    Delirium   . Sertraline Hcl Other (See Comments)    Delirium      CODE STATUS: DNR   GOALS OF CARE: Advanced Directives 01/10/2016  Does Patient Have a Medical Advance Directive? Yes  Type of Advance Directive Out of facility DNR (pink MOST or yellow form)  Does patient want to make changes to medical advance directive? No - Patient declined  Copy of Healthcare Power of Attorney in Chart? Yes  Pre-existing out of facility DNR order (yellow form or pink MOST form) -     Chief Complaint  Patient presents with  . Medical Management of Chronic Issues    Routine Visit     HPI 83 y.o. female seen today for routine visit. She denies any concern this visit. No concern from nursing staff.  REVIEW OF SYSTEM HENT: Negative for headache Respiratory: On chronic o2, has dyspnea, has cough Cardiovascular: Negative for chest pain, palpitations. Improved leg edema.  Gastrointestinal: Negative for heartburn, nausea, vomiting, abdominal pain.  Genitourinary: Negative for dysuria Musculoskeletal: has chronic back pain and arthritis changes to her fingers Skin: Negative for itching and rash.  Neurological: Negative for dizziness   Past Medical History:  Diagnosis Date  . Arthritis   . Atrial fibrillation (HCC)   . Congestive heart failure, unspecified   . Depressive type psychosis (HCC)   . Diverticulitis of colon (without mention of hemorrhage)(562.11)   . Duodenal ulcer, unspecified as acute or chronic, without hemorrhage, perforation, or obstruction   . Dysphagia, unspecified(787.20)   . Edema   . Gallstones   . Other B-complex deficiencies   . Other constipation   . PONV (postoperative nausea and vomiting)   . Routine general medical  examination at a health care facility   . Sciatica   . Stricture and stenosis of esophagus   . Unspecified essential hypertension   . Unspecified hypothyroidism       Allergies as of 10/03/2016      Reactions   Prednisone Other (See Comments)   Delirium    Sertraline Hcl Other (See Comments)   Delirium       Medication List       Accurate as of 10/03/16  2:56 PM. Always use your most recent med list.          acetaminophen 325 MG tablet Commonly known as:  TYLENOL Take 650 mg by mouth 3 (three) times daily.   ALPRAZolam 0.25 MG tablet Commonly known as:  XANAX Take one tablet by mouth three times daily for anxiety   budesonide-formoterol 80-4.5 MCG/ACT inhaler Commonly known as:  SYMBICORT Inhale 2 puffs into the lungs 2 (two) times daily. Reported on 12/07/2015   cloNIDine 0.1 MG tablet Commonly known as:  CATAPRES Take 1 tablet (0.1 mg total) by mouth daily.   dextromethorphan-guaiFENesin 10-100 MG/5ML liquid Commonly known as:  ROBITUSSIN-DM 2 teaspoons by mouth every 6 hours as needed for cough   diltiazem 120 MG 24 hr capsule Commonly known as:  CARDIZEM CD Take 1 capsule (120 mg total) by mouth every morning.   docusate sodium 100 MG capsule Commonly known as:  COLACE Take 100 mg by mouth at bedtime.   gabapentin 100 MG capsule Commonly known as:  NEURONTIN  Take 100 mg by mouth at bedtime.   ipratropium-albuterol 0.5-2.5 (3) MG/3ML Soln Commonly known as:  DUONEB Take 3 mLs by nebulization 2 (two) times daily as needed.   iron polysaccharides 150 MG capsule Commonly known as:  NIFEREX Take 150 mg by mouth 2 (two) times daily.   levothyroxine 25 MCG tablet Commonly known as:  SYNTHROID, LEVOTHROID Take 1 tablet (25 mcg total) by mouth every morning.   mirtazapine 30 MG tablet Commonly known as:  REMERON Take 30 mg by mouth at bedtime.   OCUSOFT LID SCRUB Pads Apply 1 each topically 2 (two) times daily.   omeprazole 10 MG capsule Commonly  known as:  PRILOSEC Take 10 mg by mouth daily.   oxyCODONE-acetaminophen 5-325 MG tablet Commonly known as:  PERCOCET/ROXICET Take 1 tablet by mouth daily.   OXYGEN Inhale 3 L into the lungs.   polyvinyl alcohol 1.4 % ophthalmic solution Commonly known as:  LIQUIFILM TEARS Place 1 drop into both eyes 4 (four) times daily.   potassium chloride SA 20 MEQ tablet Commonly known as:  K-DUR,KLOR-CON Take 20 mEq by mouth daily.   QUEtiapine 25 MG tablet Commonly known as:  SEROQUEL Take 37.5 mg by mouth at bedtime.   torsemide 20 MG tablet Commonly known as:  DEMADEX Take 40 mg by mouth daily. Reported on 09/07/2015   trimethoprim 100 MG tablet Commonly known as:  TRIMPEX Take 100 mg by mouth daily.   UNABLE TO FIND Med Name: Med Pass 120 mL by mouth 2 times daily   vitamin B-12 1000 MCG tablet Commonly known as:  CYANOCOBALAMIN Take 1,000 mcg by mouth daily.   Vitamin D-3 1000 units Caps Take 1 capsule by mouth daily.        PHYSICAL EXAM Vitals:   10/03/16 1444  BP: (!) 149/69  Pulse: 89  Resp: 16  Temp: 97.4 F (36.3 C)  TempSrc: Oral  SpO2: 92%  Weight: 104 lb 6.4 oz (47.4 kg)  Height:  (1.676 m)    Body mass index is 16.85 kg/m.  Wt Readings from Last 3 Encounters:  10/03/16 104 lb 6.4 oz (47.4 kg)  07/04/16 105 lb 3.2 oz (47.7 kg)  05/27/16 108 lb (49 kg)   General- elderly female, frail and thin built, in no acute distress Head- atraumatic, normocephalic Neck- no lymphadenopathy Nose- no nasal discharge Mouth- normal mucus membrane Cardiovascular- normal s1,s2, systolic murmur + Respiratory- poor air movement, no wheeze, no rhonchi, no crackles, on o2 by nasal canula Abdomen- bowel sounds present, soft, non tender Musculoskeletal- able to move all 4 extremities, kyphosis +, chronic skin changes. Self propels in wheelchair. Neurological: alert and conversational and can make her needs known    Labs reviewed CBC CBC Latest Ref Rng &  Units 07/18/2016 02/27/2016 11/23/2015  WBC 10:3/mL 7.2 6.0 4.8  Hemoglobin 12.0 - 16.0 g/dL 11.8(A) 11.1(A) 9.8(A)  Hematocrit 36 - 46 % 37 35(A) 31(A)  Platelets 150 - 399 K/L 270 212 232   CMP Latest Ref Rng & Units 07/18/2016 02/27/2016 11/23/2015  Glucose 65 - 99 mg/dL - - -  BUN 4 - 21 mg/dL 16(X) 09(U) 04(V)  Creatinine 0.5 - 1.1 mg/dL 4.0(J) 8.1(X) 9.1(Y)  Sodium 137 - 147 mmol/L 143 143 143  Potassium 3.4 - 5.3 mmol/L 4.8 4.3 4.8  Chloride 98 - 107 mmol/L - - -  CO2 21 - 32 mmol/L - - -  Calcium 8.5 - 10.1 mg/dL - - -  Total Protein 6.0 -  8.3 g/dL - - -  Total Bilirubin 0.3 - 1.2 mg/dL - - -  Alkaline Phos 25 - 125 U/L - - 76  AST 13 - 35 U/L - - 19  ALT 7 - 35 U/L - - 12   Lab Results  Component Value Date   HGBA1C 6.0 08/09/2016   Lipid Panel     Component Value Date/Time   CHOL 161 02/27/2016   TRIG 111 02/27/2016   HDL 46 02/27/2016   LDLCALC 93 02/27/2016   Lab Results  Component Value Date   TSH 3.86 08/09/2016   08/09/16 Microalbumin with Creatinine, Urine - 11.4  Microalbumin/Creatinine Ratio- 34.5   07/24/16 Diabetic Foot Exam- Done   Assessment/Plan  Neuropathic pain Stable, continue gabapentin 100 mg daily  Dementia with behavior disturbance Stable, decline anticipated. Continue seroquel 37.5 mg daily  Chronic diastolic chf Stable with improved leg edema. Decrease torsemide to 20 mg daily. Continue kcl supplement. Monitor clinically.   afib Controlled HR, continue diltiazem 120 mg daily   [name] Grout, MD Internal Medicine [facility] [address] [place], [place] [address] Cell Phone (Monday-Friday 8 am - 5 pm): [phone] On Call: [phone] and follow prompts after 5 pm and on weekends Office Phone: [phone] Office Fax: [phone]

## 2016-11-18 ENCOUNTER — Non-Acute Institutional Stay (SKILLED_NURSING_FACILITY): Payer: Medicare Other

## 2016-11-18 DIAGNOSIS — Z0000 Encounter for general adult medical examination without abnormal findings: Secondary | ICD-10-CM | POA: Diagnosis not present

## 2016-11-18 NOTE — Progress Notes (Signed)
Quick Notes   Health Maintenance: DEXA due, let me know if you want me to order it.     Abnormal Screen: 6 CIT-25     Patient Concerns: none     Nurse Concerns: none

## 2016-11-18 NOTE — Progress Notes (Signed)
Subjective:   [name] [name] is a 83 y.o. female who presents for an Initial Medicare Annual Wellness Visit at [facility].     Objective:    Today's Vitals   11/18/16 1026  BP: 130/90  Pulse: 97  Temp: 98.3 F (36.8 C)  TempSrc: Oral  Weight: 104 lb (47.2 kg)  Height: 5\' 6"  (1.676 m)   Body mass index is 16.79 kg/m.   Current Medications (verified) Outpatient Encounter Prescriptions as of 11/18/2016  Medication Sig  . acetaminophen (TYLENOL) 325 MG tablet Take 650 mg by mouth 3 (three) times daily.   Marland Kitchen ALPRAZolam (XANAX) 0.25 MG tablet Take one tablet by mouth three times daily for anxiety  . budesonide-formoterol (SYMBICORT) 80-4.5 MCG/ACT inhaler Inhale 2 puffs into the lungs 2 (two) times daily. Reported on 12/07/2015  . Cholecalciferol (VITAMIN D-3) 1000 UNITS CAPS Take 1 capsule by mouth daily.  . cloNIDine (CATAPRES) 0.1 MG tablet Take 1 tablet (0.1 mg total) by mouth daily.  Marland Kitchen dextromethorphan-guaiFENesin (ROBITUSSIN-DM) 10-100 MG/5ML liquid 2 teaspoons by mouth every 6 hours as needed for cough  . diltiazem (CARDIZEM CD) 120 MG 24 hr capsule Take 1 capsule (120 mg total) by mouth every morning.  . docusate sodium (COLACE) 100 MG capsule Take 100 mg by mouth at bedtime.  . Eyelid Cleansers (OCUSOFT LID SCRUB) PADS Apply 1 each topically 2 (two) times daily.   Marland Kitchen gabapentin (NEURONTIN) 100 MG capsule Take 100 mg by mouth at bedtime.  Marland Kitchen ipratropium-albuterol (DUONEB) 0.5-2.5 (3) MG/3ML SOLN Take 3 mLs by nebulization 2 (two) times daily as needed.  . iron polysaccharides (NIFEREX) 150 MG capsule Take 150 mg by mouth 2 (two) times daily.  Marland Kitchen levothyroxine (SYNTHROID, LEVOTHROID) 25 MCG tablet Take 1 tablet (25 mcg total) by mouth every morning.  . mirtazapine (REMERON) 30 MG tablet Take 30 mg by mouth at bedtime.  Marland Kitchen omeprazole (PRILOSEC) 10 MG capsule Take 10 mg by mouth daily.  Marland Kitchen oxyCODONE-acetaminophen (PERCOCET/ROXICET) 5-325 MG tablet Take 1 tablet  by mouth daily.  . OXYGEN Inhale 3 L into the lungs.  . polyvinyl alcohol (LIQUIFILM TEARS) 1.4 % ophthalmic solution Place 1 drop into both eyes 4 (four) times daily.   . potassium chloride SA (K-DUR,KLOR-CON) 20 MEQ tablet Take 20 mEq by mouth daily.  . QUEtiapine (SEROQUEL) 25 MG tablet Take 37.5 mg by mouth at bedtime.   . torsemide (DEMADEX) 20 MG tablet Take 40 mg by mouth daily. Reported on 09/07/2015  . trimethoprim (TRIMPEX) 100 MG tablet Take 100 mg by mouth daily.   Marland Kitchen UNABLE TO FIND Med Name: Med Pass 120 mL by mouth 2 times daily  . vitamin B-12 (CYANOCOBALAMIN) 1000 MCG tablet Take 1,000 mcg by mouth daily.   No facility-administered encounter medications on file as of 11/18/2016.     Allergies (verified) Prednisone and Sertraline hcl   History: Past Medical History:  Diagnosis Date  . Arthritis   . Atrial fibrillation (HCC)   . Congestive heart failure, unspecified   . Depressive type psychosis (HCC)   . Diverticulitis of colon (without mention of hemorrhage)(562.11)   . Duodenal ulcer, unspecified as acute or chronic, without hemorrhage, perforation, or obstruction   . Dysphagia, unspecified(787.20)   . Edema   . Gallstones   . Other B-complex deficiencies   . Other constipation   . PONV (postoperative nausea and vomiting)   . Routine general medical examination at a health care facility   . Sciatica   .  Stricture and stenosis of esophagus   . Unspecified essential hypertension   . Unspecified hypothyroidism    Past Surgical History:  Procedure Laterality Date  . ABDOMINAL HYSTERECTOMY    . CHOLECYSTECTOMY     Family History  Problem Relation Age of Onset  . Cancer Other        breast   . Heart disease Other    Social History   Occupational History  . Not on file.   Social History Main Topics  . Smoking status: Never Smoker  . Smokeless tobacco: Former NeurosurgeonUser    Types: Snuff    Quit date: 01/13/1964  . Alcohol use No  . Drug use: No  . Sexual  activity: No    Tobacco Counseling Counseling given: Not Answered   Activities of Daily Living In your present state of health, do you have any difficulty performing the following activities: 11/18/2016  Hearing? N  Vision? N  Difficulty concentrating or making decisions? Y  Walking or climbing stairs? Y  Dressing or bathing? Y  Doing errands, shopping? Y  Preparing Food and eating ? Y  Using the Toilet? Y  In the past six months, have you accidently leaked urine? Y  Do you have problems with loss of bowel control? Y  Managing your Medications? Y  Managing your Finances? Y  Housekeeping or managing your Housekeeping? Y  Some recent data might be hidden    Immunizations and Health Maintenance Immunization History  Administered Date(s) Administered  . Influenza Split 05/02/2011  . Influenza Whole 04/06/2009, 05/26/2012, 04/15/2013  . Influenza-Unspecified 04/11/2014, 04/12/2015, 04/02/2016  . PPD Test 11/05/2010, 01/13/2012, 12/04/2012, 12/18/2012, 12/04/2013, 03/15/2014, 03/07/2015  . Pneumococcal Conjugate-13 12/03/2012  . Pneumococcal Polysaccharide-23 07/22/2012  . Tetanus 07/22/2012   There are no preventive care reminders to display for this patient.  Patient Care Team: [name], Mahima, MD as PCP - General (Internal Medicine)  Indicate any recent Medical Services you may have received from other than [facility] providers in the past year (date may be approximate).     Assessment:   This is a routine wellness examination for [name] [name].   Hearing/Vision screen No exam data present  Dietary issues and exercise activities discussed: Current Exercise Habits: The patient does not participate in regular exercise at present, Exercise limited by: None identified  Goals    . Maintain Lifestyle          Starting today pt will maintain lifestyle.       Depression Screen PHQ 2/9 Scores 11/18/2016  PHQ - 2 Score 0    Fall Risk Fall Risk  11/18/2016  Falls in the past year?  No    Cognitive Function:     6CIT Screen 11/18/2016  What Year? 4 points  What month? 0 points  What time? 3 points  Count back from 20 4 points  Months in reverse 4 points  Repeat phrase 10 points  Total Score 25    Screening Tests Health Maintenance  Topic Date Due  . DEXA SCAN  07/01/2018 (Originally 07/03/1983)  . OPHTHALMOLOGY EXAM  11/21/2016  . FOOT EXAM  01/16/2017  . INFLUENZA VACCINE  01/29/2017  . HEMOGLOBIN A1C  02/06/2017  . URINE MICROALBUMIN  08/09/2017  . TETANUS/TDAP  07/22/2022  . PNA vac Low Risk Adult  Completed      Plan:    I have personally reviewed and addressed the Medicare Annual Wellness questionnaire and have noted the following in the patient's chart:  A. Medical and social  history B. Use of alcohol, tobacco or illicit drugs  C. Current medications and supplements D. Functional ability and status E.  Nutritional status F.  Physical activity G. Advance directives H. List of other physicians I.  Hospitalizations, surgeries, and ER visits in previous 12 months J.  Vitals K. Screenings to include hearing, vision, cognitive, depression L. Referrals and appointments - none  In addition, I have reviewed and discussed with patient certain preventive protocols, quality metrics, and best practice recommendations. A written personalized care plan for preventive services as well as general preventive health recommendations were provided to patient.  See attached scanned questionnaire for additional information.   Signed,   Annetta Maw, RN Nurse Health Advisor

## 2016-11-18 NOTE — Patient Instructions (Signed)
[name] [name] , Thank ySundra [name] for taking time to come for your Medicare Wellness Visit. I appreciate your ongoing commitment to your health goals. Please review the following plan we discussed and let me know if I can assist you in the future.   Screening recommendations/referrals: Colonoscopy up to date, pt over 75 years  Mammogram up to date, pt over 75 years Bone Density due. Recommended yearly ophthalmology/optometry visit for glaucoma screening and checkup Recommended yearly dental visit for hygiene and checkup  Vaccinations: Influenza vaccine up to date Pneumococcal vaccine up to date Tdap vaccine not in recrds Shingles vaccine not in records  Advanced directives: DNR in chart  Conditions/risks identified: none  Next appointment: none up coming   Preventive Care 65 Years and Older, Female Preventive care refers to lifestyle choices and visits with your health care provider that can promote health and wellness. What does preventive care include?  A yearly physical exam. This is also called an annual well check.  Dental exams once or twice a year.  Routine eye exams. Ask your health care provider how often you should have your eyes checked.  Personal lifestyle choices, including:  Daily care of your teeth and gums.  Regular physical activity.  Eating a healthy diet.  Avoiding tobacco and drug use.  Limiting alcohol use.  Practicing safe sex.  Taking low-dose aspirin every day.  Taking vitamin and mineral supplements as recommended by your health care provider. What happens during an annual well check? The services and screenings done by your health care provider during your annual well check will depend on your age, overall health, lifestyle risk factors, and family history of disease. Counseling  Your health care provider may ask you questions about your:  Alcohol use.  Tobacco use.  Drug use.  Emotional well-being.  Home and relationship  well-being.  Sexual activity.  Eating habits.  History of falls.  Memory and ability to understand (cognition).  Work and work Astronomerenvironment.  Reproductive health. Screening  You may have the following tests or measurements:  Height, weight, and BMI.  Blood pressure.  Lipid and cholesterol levels. These may be checked every 5 years, or more frequently if you are over 81 years old.  Skin check.  Lung cancer screening. You may have this screening every year starting at age 81 if you have a 30-pack-year history of smoking and currently smoke or have quit within the past 15 years.  Fecal occult blood test (FOBT) of the stool. You may have this test every year starting at age 81.  Flexible sigmoidoscopy or colonoscopy. You may have a sigmoidoscopy every 5 years or a colonoscopy every 10 years starting at age 81.  Hepatitis C blood test.  Hepatitis B blood test.  Sexually transmitted disease (STD) testing.  Diabetes screening. This is done by checking your blood sugar (glucose) after you have not eaten for a while (fasting). You may have this done every 1-3 years.  Bone density scan. This is done to screen for osteoporosis. You may have this done starting at age 81.  Mammogram. This may be done every 1-2 years. Talk to your health care provider about how often you should have regular mammograms. Talk with your health care provider about your test results, treatment options, and if necessary, the need for more tests. Vaccines  Your health care provider may recommend certain vaccines, such as:  Influenza vaccine. This is recommended every year.  Tetanus, diphtheria, and acellular pertussis (Tdap, Td) vaccine. You may need  a Td booster every 10 years.  Zoster vaccine. You may need this after age 37.  Pneumococcal 13-valent conjugate (PCV13) vaccine. One dose is recommended after age 79.  Pneumococcal polysaccharide (PPSV23) vaccine. One dose is recommended after age  33. Talk to your health care provider about which screenings and vaccines you need and how often you need them. This information is not intended to replace advice given to you by your health care provider. Make sure you discuss any questions you have with your health care provider. Document Released: 07/14/2015 Document Revised: 03/06/2016 Document Reviewed: 04/18/2015 Elsevier Interactive Patient Education  2017 [place] Prevention in the Home Falls can cause injuries. They can happen to people of all ages. There are many things you can do to make your home safe and to help prevent falls. What can I do on the outside of my home?  Regularly fix the edges of walkways and driveways and fix any cracks.  Remove anything that might make you trip as you walk through a door, such as a raised step or threshold.  Trim any bushes or trees on the path to your home.  Use bright outdoor lighting.  Clear any walking paths of anything that might make someone trip, such as rocks or tools.  Regularly check to see if handrails are loose or broken. Make sure that both sides of any steps have handrails.  Any raised decks and porches should have guardrails on the edges.  Have any leaves, snow, or ice cleared regularly.  Use sand or salt on walking paths during winter.  Clean up any spills in your garage right away. This includes oil or grease spills. What can I do in the bathroom?  Use night lights.  Install grab bars by the toilet and in the tub and shower. Do not use towel bars as grab bars.  Use non-skid mats or decals in the tub or shower.  If you need to sit down in the shower, use a plastic, non-slip stool.  Keep the floor dry. Clean up any water that spills on the floor as soon as it happens.  Remove soap buildup in the tub or shower regularly.  Attach bath mats securely with double-sided non-slip rug tape.  Do not have throw rugs and other things on the floor that can make  you trip. What can I do in the bedroom?  Use night lights.  Make sure that you have a light by your bed that is easy to reach.  Do not use any sheets or blankets that are too big for your bed. They should not hang down onto the floor.  Have a firm chair that has side arms. You can use this for support while you get dressed.  Do not have throw rugs and other things on the floor that can make you trip. What can I do in the kitchen?  Clean up any spills right away.  Avoid walking on wet floors.  Keep items that you use a lot in easy-to-reach places.  If you need to reach something above you, use a strong step stool that has a grab bar.  Keep electrical cords out of the way.  Do not use floor polish or wax that makes floors slippery. If you must use wax, use non-skid floor wax.  Do not have throw rugs and other things on the floor that can make you trip. What can I do with my stairs?  Do not leave any items on the  stairs.  Make sure that there are handrails on both sides of the stairs and use them. Fix handrails that are broken or loose. Make sure that handrails are as long as the stairways.  Check any carpeting to make sure that it is firmly attached to the stairs. Fix any carpet that is loose or worn.  Avoid having throw rugs at the top or bottom of the stairs. If you do have throw rugs, attach them to the floor with carpet tape.  Make sure that you have a light switch at the top of the stairs and the bottom of the stairs. If you do not have them, ask someone to add them for you. What else can I do to help prevent falls?  Wear shoes that:  Do not have high heels.  Have rubber bottoms.  Are comfortable and fit you well.  Are closed at the toe. Do not wear sandals.  If you use a stepladder:  Make sure that it is fully opened. Do not climb a closed stepladder.  Make sure that both sides of the stepladder are locked into place.  Ask someone to hold it for you, if  possible.  Clearly mark and make sure that you can see:  Any grab bars or handrails.  First and last steps.  Where the edge of each step is.  Use tools that help you move around (mobility aids) if they are needed. These include:  Canes.  Walkers.  Scooters.  Crutches.  Turn on the lights when you go into a dark area. Replace any light bulbs as soon as they burn out.  Set up your furniture so you have a clear path. Avoid moving your furniture around.  If any of your floors are uneven, fix them.  If there are any pets around you, be aware of where they are.  Review your medicines with your doctor. Some medicines can make you feel dizzy. This can increase your chance of falling. Ask your doctor what other things that you can do to help prevent falls. This information is not intended to replace advice given to you by your health care provider. Make sure you discuss any questions you have with your health care provider. Document Released: 04/13/2009 Document Revised: 11/23/2015 Document Reviewed: 07/22/2014 Elsevier Interactive Patient Education  2017 [employer].

## 2017-07-01 DEATH — deceased

## 2018-02-11 ENCOUNTER — Encounter: Payer: Self-pay | Admitting: Internal Medicine
